# Patient Record
Sex: Female | Born: 1945
Health system: Southern US, Community
[De-identification: ages and names within clinical notes are randomized; demographics above are authoritative.]

## PROBLEM LIST (undated history)

## (undated) DIAGNOSIS — M1990 Unspecified osteoarthritis, unspecified site: Secondary | ICD-10-CM

## (undated) DIAGNOSIS — M47816 Spondylosis without myelopathy or radiculopathy, lumbar region: Secondary | ICD-10-CM

## (undated) DIAGNOSIS — G5600 Carpal tunnel syndrome, unspecified upper limb: Secondary | ICD-10-CM

## (undated) DIAGNOSIS — T7840XA Allergy, unspecified, initial encounter: Secondary | ICD-10-CM

## (undated) DIAGNOSIS — E559 Vitamin D deficiency, unspecified: Secondary | ICD-10-CM

## (undated) DIAGNOSIS — D649 Anemia, unspecified: Secondary | ICD-10-CM

## (undated) DIAGNOSIS — K219 Gastro-esophageal reflux disease without esophagitis: Secondary | ICD-10-CM

## (undated) DIAGNOSIS — R112 Nausea with vomiting, unspecified: Secondary | ICD-10-CM

## (undated) DIAGNOSIS — I10 Essential (primary) hypertension: Secondary | ICD-10-CM

## (undated) DIAGNOSIS — K5733 Diverticulitis of large intestine without perforation or abscess with bleeding: Secondary | ICD-10-CM

## (undated) DIAGNOSIS — R51 Headache: Secondary | ICD-10-CM

## (undated) DIAGNOSIS — M8580 Other specified disorders of bone density and structure, unspecified site: Secondary | ICD-10-CM

## (undated) DIAGNOSIS — H269 Unspecified cataract: Secondary | ICD-10-CM

## (undated) DIAGNOSIS — R42 Dizziness and giddiness: Secondary | ICD-10-CM

## (undated) DIAGNOSIS — Z9889 Other specified postprocedural states: Secondary | ICD-10-CM

## (undated) HISTORY — PX: COLONOSCOPY: SHX174

## (undated) HISTORY — PX: KNEE ARTHROSCOPY: SUR90

## (undated) HISTORY — DX: Allergy, unspecified, initial encounter: T78.40XA

## (undated) HISTORY — DX: Carpal tunnel syndrome, unspecified upper limb: G56.00

## (undated) HISTORY — DX: Unspecified cataract: H26.9

## (undated) HISTORY — PX: OTHER SURGICAL HISTORY: SHX169

## (undated) HISTORY — DX: Vitamin D deficiency, unspecified: E55.9

## (undated) HISTORY — DX: Other specified disorders of bone density and structure, unspecified site: M85.80

## (undated) HISTORY — DX: Dizziness and giddiness: R42

## (undated) HISTORY — PX: CARPAL TUNNEL RELEASE: SHX101

## (undated) HISTORY — DX: Diverticulitis of large intestine without perforation or abscess with bleeding: K57.33

## (undated) HISTORY — DX: Spondylosis without myelopathy or radiculopathy, lumbar region: M47.816

## (undated) HISTORY — PX: FOOT SURGERY: SHX648

---

## 1975-12-25 HISTORY — PX: VAGINAL HYSTERECTOMY: SUR661

## 1980-12-24 HISTORY — PX: BREAST REDUCTION SURGERY: SHX8

## 1980-12-24 HISTORY — PX: REDUCTION MAMMAPLASTY: SUR839

## 1998-09-22 ENCOUNTER — Encounter: Admission: RE | Admit: 1998-09-22 | Discharge: 1998-12-14 | Payer: Self-pay | Admitting: Anesthesiology

## 1999-11-01 ENCOUNTER — Ambulatory Visit (HOSPITAL_COMMUNITY): Admission: RE | Admit: 1999-11-01 | Discharge: 1999-11-01 | Payer: Self-pay | Admitting: *Deleted

## 2000-09-11 ENCOUNTER — Ambulatory Visit (HOSPITAL_COMMUNITY): Admission: RE | Admit: 2000-09-11 | Discharge: 2000-09-11 | Payer: Self-pay | Admitting: *Deleted

## 2000-11-26 ENCOUNTER — Ambulatory Visit (HOSPITAL_COMMUNITY): Admission: RE | Admit: 2000-11-26 | Discharge: 2000-11-26 | Payer: Self-pay | Admitting: *Deleted

## 2002-05-15 ENCOUNTER — Ambulatory Visit (HOSPITAL_COMMUNITY): Admission: RE | Admit: 2002-05-15 | Discharge: 2002-05-15 | Payer: Self-pay | Admitting: Family Medicine

## 2002-05-15 ENCOUNTER — Encounter: Payer: Self-pay | Admitting: Family Medicine

## 2003-04-19 ENCOUNTER — Ambulatory Visit (HOSPITAL_BASED_OUTPATIENT_CLINIC_OR_DEPARTMENT_OTHER): Admission: RE | Admit: 2003-04-19 | Discharge: 2003-04-19 | Payer: Self-pay

## 2003-09-10 ENCOUNTER — Emergency Department (HOSPITAL_COMMUNITY): Admission: EM | Admit: 2003-09-10 | Discharge: 2003-09-10 | Payer: Self-pay | Admitting: Emergency Medicine

## 2003-11-30 ENCOUNTER — Emergency Department (HOSPITAL_COMMUNITY): Admission: AD | Admit: 2003-11-30 | Discharge: 2003-11-30 | Payer: Self-pay | Admitting: Family Medicine

## 2004-09-05 ENCOUNTER — Ambulatory Visit: Payer: Self-pay | Admitting: Family Medicine

## 2004-09-13 ENCOUNTER — Ambulatory Visit: Payer: Self-pay | Admitting: *Deleted

## 2004-09-13 ENCOUNTER — Ambulatory Visit: Payer: Self-pay | Admitting: Family Medicine

## 2004-09-15 ENCOUNTER — Ambulatory Visit (HOSPITAL_COMMUNITY): Admission: RE | Admit: 2004-09-15 | Discharge: 2004-09-15 | Payer: Self-pay | Admitting: Internal Medicine

## 2004-09-18 ENCOUNTER — Emergency Department (HOSPITAL_COMMUNITY): Admission: EM | Admit: 2004-09-18 | Discharge: 2004-09-19 | Payer: Self-pay | Admitting: Emergency Medicine

## 2004-09-20 ENCOUNTER — Ambulatory Visit: Payer: Self-pay | Admitting: *Deleted

## 2005-03-21 ENCOUNTER — Ambulatory Visit: Payer: Self-pay | Admitting: Family Medicine

## 2005-10-17 ENCOUNTER — Ambulatory Visit: Payer: Self-pay | Admitting: Family Medicine

## 2006-04-19 ENCOUNTER — Ambulatory Visit: Payer: Self-pay | Admitting: Family Medicine

## 2006-06-07 ENCOUNTER — Ambulatory Visit: Payer: Self-pay | Admitting: *Deleted

## 2006-06-12 ENCOUNTER — Ambulatory Visit (HOSPITAL_COMMUNITY): Admission: RE | Admit: 2006-06-12 | Discharge: 2006-06-12 | Payer: Self-pay | Admitting: Gastroenterology

## 2006-08-21 ENCOUNTER — Ambulatory Visit: Payer: Self-pay | Admitting: Family Medicine

## 2006-09-18 ENCOUNTER — Ambulatory Visit: Payer: Self-pay | Admitting: Internal Medicine

## 2006-10-10 ENCOUNTER — Ambulatory Visit (HOSPITAL_COMMUNITY): Admission: RE | Admit: 2006-10-10 | Discharge: 2006-10-10 | Payer: Self-pay | Admitting: Internal Medicine

## 2006-11-20 ENCOUNTER — Ambulatory Visit: Payer: Self-pay | Admitting: Family Medicine

## 2007-02-14 ENCOUNTER — Ambulatory Visit: Payer: Self-pay | Admitting: Internal Medicine

## 2007-02-26 ENCOUNTER — Ambulatory Visit: Payer: Self-pay | Admitting: Family Medicine

## 2007-03-05 ENCOUNTER — Ambulatory Visit: Payer: Self-pay | Admitting: Family Medicine

## 2007-03-06 ENCOUNTER — Ambulatory Visit (HOSPITAL_COMMUNITY): Admission: RE | Admit: 2007-03-06 | Discharge: 2007-03-06 | Payer: Self-pay | Admitting: Internal Medicine

## 2007-04-02 ENCOUNTER — Ambulatory Visit (HOSPITAL_COMMUNITY): Admission: RE | Admit: 2007-04-02 | Discharge: 2007-04-02 | Payer: Self-pay | Admitting: Internal Medicine

## 2007-06-25 ENCOUNTER — Ambulatory Visit: Payer: Self-pay | Admitting: Internal Medicine

## 2007-07-17 ENCOUNTER — Encounter (INDEPENDENT_AMBULATORY_CARE_PROVIDER_SITE_OTHER): Payer: Self-pay | Admitting: Family Medicine

## 2007-07-17 ENCOUNTER — Ambulatory Visit: Payer: Self-pay | Admitting: Internal Medicine

## 2007-07-17 LAB — CONVERTED CEMR LAB
ALT: 15 units/L (ref 0–35)
Albumin: 3.9 g/dL (ref 3.5–5.2)
Alkaline Phosphatase: 41 units/L (ref 39–117)
CO2: 25 meq/L (ref 19–32)
Calcium: 10 mg/dL (ref 8.4–10.5)
Chloride: 106 meq/L (ref 96–112)
Cholesterol: 171 mg/dL (ref 0–200)
Creatinine, Ser: 0.71 mg/dL (ref 0.40–1.20)
Eosinophils Relative: 6 % — ABNORMAL HIGH (ref 0–5)
HCT: 29.5 % — ABNORMAL LOW (ref 36.0–46.0)
HDL: 57 mg/dL (ref >39)
Neutro Abs: 2.2 10*3/uL (ref 1.7–7.7)
Neutrophils Relative %: 45 % (ref 43–77)
RBC: 3.95 M/uL (ref 3.87–5.11)
Sodium: 138 meq/L (ref 135–145)
Total Bilirubin: 0.4 mg/dL (ref 0.3–1.2)
Total CHOL/HDL Ratio: 3
Total Protein: 7.4 g/dL (ref 6.0–8.3)

## 2007-09-10 ENCOUNTER — Encounter (INDEPENDENT_AMBULATORY_CARE_PROVIDER_SITE_OTHER): Payer: Self-pay | Admitting: *Deleted

## 2007-11-28 ENCOUNTER — Encounter (INDEPENDENT_AMBULATORY_CARE_PROVIDER_SITE_OTHER): Payer: Self-pay | Admitting: Internal Medicine

## 2007-11-28 ENCOUNTER — Ambulatory Visit: Payer: Self-pay | Admitting: Family Medicine

## 2007-11-28 LAB — CONVERTED CEMR LAB
Basophils Absolute: 0 10*3/uL
Basophils Relative: 1 %
Eosinophils Absolute: 0.3 10*3/uL
Eosinophils Relative: 5 %
Ferritin: 5 ng/mL — ABNORMAL LOW
HCT: 32 % — ABNORMAL LOW
Hemoglobin: 10 g/dL — ABNORMAL LOW
Iron: 28 ug/dL — ABNORMAL LOW
Lymphocytes Relative: 41 %
Lymphs Abs: 2.6 10*3/uL
MCHC: 31.3 g/dL
MCV: 77.5 fL — ABNORMAL LOW
Monocytes Absolute: 0.5 10*3/uL
Monocytes Relative: 8 %
Neutro Abs: 2.9 10*3/uL
Neutrophils Relative %: 46 %
Platelets: 346 10*3/uL
RBC: 4.13 M/uL
RDW: 17.5 % — ABNORMAL HIGH
Saturation Ratios: 7 % — ABNORMAL LOW
TIBC: 392 ug/dL
UIBC: 364 ug/dL
WBC: 6.3 10*3/uL

## 2007-12-10 ENCOUNTER — Ambulatory Visit (HOSPITAL_COMMUNITY): Admission: RE | Admit: 2007-12-10 | Discharge: 2007-12-10 | Payer: Self-pay | Admitting: Family Medicine

## 2008-05-18 ENCOUNTER — Ambulatory Visit: Payer: Self-pay | Admitting: Internal Medicine

## 2008-05-18 ENCOUNTER — Encounter (INDEPENDENT_AMBULATORY_CARE_PROVIDER_SITE_OTHER): Payer: Self-pay | Admitting: Family Medicine

## 2008-05-18 LAB — CONVERTED CEMR LAB
Basophils Relative: 0 % (ref 0–1)
Eosinophils Absolute: 0.4 10*3/uL (ref 0.0–0.7)
HCT: 34.8 % — ABNORMAL LOW (ref 36.0–46.0)
Hemoglobin: 11.3 g/dL — ABNORMAL LOW (ref 12.0–15.0)
Iron: 17 ug/dL — ABNORMAL LOW (ref 42–145)
Lymphocytes Relative: 39 % (ref 12–46)
Lymphs Abs: 3 10*3/uL (ref 0.7–4.0)
Neutro Abs: 3.7 10*3/uL (ref 1.7–7.7)
RDW: 17.8 % — ABNORMAL HIGH (ref 11.5–15.5)

## 2008-10-14 ENCOUNTER — Ambulatory Visit: Payer: Self-pay | Admitting: Internal Medicine

## 2008-11-09 ENCOUNTER — Ambulatory Visit: Payer: Self-pay | Admitting: Internal Medicine

## 2008-11-10 ENCOUNTER — Encounter (INDEPENDENT_AMBULATORY_CARE_PROVIDER_SITE_OTHER): Payer: Self-pay | Admitting: Adult Health

## 2008-11-10 LAB — CONVERTED CEMR LAB
ALT: 21 units/L (ref 0–35)
AST: 22 units/L (ref 0–37)
Albumin: 3.9 g/dL (ref 3.5–5.2)
Alkaline Phosphatase: 47 units/L (ref 39–117)
BUN: 17 mg/dL (ref 6–23)
Basophils Absolute: 0 10*3/uL (ref 0.0–0.1)
Basophils Relative: 0 % (ref 0–1)
CO2: 25 meq/L (ref 19–32)
Calcium: 10.4 mg/dL (ref 8.4–10.5)
Chloride: 103 meq/L (ref 96–112)
Creatinine, Ser: 0.62 mg/dL (ref 0.40–1.20)
Eosinophils Absolute: 0.3 10*3/uL (ref 0.0–0.7)
Eosinophils Relative: 5 % (ref 0–5)
Glucose, Bld: 94 mg/dL (ref 70–99)
HCT: 36.2 % (ref 36.0–46.0)
Hemoglobin: 11.1 g/dL — ABNORMAL LOW (ref 12.0–15.0)
Iron: 21 ug/dL — ABNORMAL LOW (ref 42–145)
Lymphocytes Relative: 46 % (ref 12–46)
Lymphs Abs: 2.8 10*3/uL (ref 0.7–4.0)
MCHC: 30.7 g/dL (ref 30.0–36.0)
MCV: 84 fL (ref 78.0–100.0)
Monocytes Absolute: 0.4 10*3/uL (ref 0.1–1.0)
Monocytes Relative: 7 % (ref 3–12)
Neutro Abs: 2.6 10*3/uL (ref 1.7–7.7)
Neutrophils Relative %: 43 % (ref 43–77)
Platelets: 326 10*3/uL (ref 150–400)
Potassium: 4.4 meq/L (ref 3.5–5.3)
RBC: 4.31 M/uL (ref 3.87–5.11)
RDW: 15.8 % — ABNORMAL HIGH (ref 11.5–15.5)
Saturation Ratios: 6 % — ABNORMAL LOW (ref 20–55)
Sed Rate: 27 mm/hr — ABNORMAL HIGH (ref 0–22)
Sodium: 137 meq/L (ref 135–145)
TIBC: 379 ug/dL (ref 250–470)
TSH: 0.631 microintl units/mL (ref 0.350–4.50)
Total Bilirubin: 0.3 mg/dL (ref 0.3–1.2)
Total Protein: 7.6 g/dL (ref 6.0–8.3)
UIBC: 358 ug/dL
WBC: 6.2 10*3/uL (ref 4.0–10.5)

## 2009-01-25 ENCOUNTER — Ambulatory Visit: Payer: Self-pay | Admitting: Internal Medicine

## 2009-01-25 ENCOUNTER — Encounter (INDEPENDENT_AMBULATORY_CARE_PROVIDER_SITE_OTHER): Payer: Self-pay | Admitting: Adult Health

## 2009-01-25 LAB — CONVERTED CEMR LAB
AST: 18 units/L (ref 0–37)
Albumin: 4 g/dL (ref 3.5–5.2)
Alkaline Phosphatase: 45 units/L (ref 39–117)
Basophils Absolute: 0 10*3/uL (ref 0.0–0.1)
CO2: 23 meq/L (ref 19–32)
Creatinine, Ser: 0.79 mg/dL (ref 0.40–1.20)
MCHC: 33 g/dL (ref 30.0–36.0)
MCV: 82.2 fL (ref 78.0–100.0)
Monocytes Absolute: 0.6 10*3/uL (ref 0.1–1.0)
Monocytes Relative: 8 % (ref 3–12)
Neutro Abs: 3.8 10*3/uL (ref 1.7–7.7)
Neutrophils Relative %: 52 % (ref 43–77)
RDW: 16.3 % — ABNORMAL HIGH (ref 11.5–15.5)
Sodium: 139 meq/L (ref 135–145)
Total Bilirubin: 0.3 mg/dL (ref 0.3–1.2)
Total Protein: 7.9 g/dL (ref 6.0–8.3)

## 2009-04-29 ENCOUNTER — Ambulatory Visit: Payer: Self-pay | Admitting: Internal Medicine

## 2009-05-03 ENCOUNTER — Ambulatory Visit (HOSPITAL_COMMUNITY): Admission: RE | Admit: 2009-05-03 | Discharge: 2009-05-03 | Payer: Self-pay | Admitting: Internal Medicine

## 2009-05-11 ENCOUNTER — Ambulatory Visit: Payer: Self-pay | Admitting: Family Medicine

## 2009-07-06 ENCOUNTER — Ambulatory Visit: Payer: Self-pay | Admitting: Family Medicine

## 2009-09-01 ENCOUNTER — Ambulatory Visit: Payer: Self-pay | Admitting: Family Medicine

## 2009-09-01 ENCOUNTER — Telehealth (INDEPENDENT_AMBULATORY_CARE_PROVIDER_SITE_OTHER): Payer: Self-pay | Admitting: *Deleted

## 2009-09-23 ENCOUNTER — Encounter (INDEPENDENT_AMBULATORY_CARE_PROVIDER_SITE_OTHER): Payer: Self-pay | Admitting: Adult Health

## 2009-09-23 ENCOUNTER — Ambulatory Visit: Payer: Self-pay | Admitting: Internal Medicine

## 2009-09-23 LAB — CONVERTED CEMR LAB
ALT: 19 units/L (ref 0–35)
Albumin: 3.9 g/dL (ref 3.5–5.2)
Chloride: 104 meq/L (ref 96–112)
Creatinine, Ser: 0.73 mg/dL (ref 0.40–1.20)
Eosinophils Relative: 7 % — ABNORMAL HIGH (ref 0–5)
Hemoglobin: 11.4 g/dL — ABNORMAL LOW (ref 12.0–15.0)
Lymphs Abs: 2.5 10*3/uL (ref 0.7–4.0)
MCHC: 32.9 g/dL (ref 30.0–36.0)
Monocytes Relative: 8 % (ref 3–12)
Neutrophils Relative %: 47 % (ref 43–77)
Platelets: 306 10*3/uL (ref 150–400)
RDW: 15.8 % — ABNORMAL HIGH (ref 11.5–15.5)
Sodium: 140 meq/L (ref 135–145)
Vitamin B-12: 900 pg/mL (ref 211–911)

## 2009-10-26 ENCOUNTER — Encounter: Admission: RE | Admit: 2009-10-26 | Discharge: 2009-12-21 | Payer: Self-pay | Admitting: Occupational Medicine

## 2009-12-27 ENCOUNTER — Encounter: Admission: RE | Admit: 2009-12-27 | Discharge: 2010-02-01 | Payer: Self-pay | Admitting: Occupational Medicine

## 2010-05-04 ENCOUNTER — Encounter (INDEPENDENT_AMBULATORY_CARE_PROVIDER_SITE_OTHER): Payer: Self-pay | Admitting: Adult Health

## 2010-05-04 ENCOUNTER — Ambulatory Visit: Payer: Self-pay | Admitting: Family Medicine

## 2010-05-18 ENCOUNTER — Ambulatory Visit (HOSPITAL_COMMUNITY): Admission: RE | Admit: 2010-05-18 | Discharge: 2010-05-18 | Payer: Self-pay | Admitting: Internal Medicine

## 2010-06-08 ENCOUNTER — Ambulatory Visit: Payer: Self-pay | Admitting: Internal Medicine

## 2010-06-08 ENCOUNTER — Encounter (INDEPENDENT_AMBULATORY_CARE_PROVIDER_SITE_OTHER): Payer: Self-pay | Admitting: Adult Health

## 2010-06-08 LAB — CONVERTED CEMR LAB
Albumin: 3.8 g/dL (ref 3.5–5.2)
BUN: 13 mg/dL (ref 6–23)
CO2: 24 meq/L (ref 19–32)
Calcium: 9.5 mg/dL (ref 8.4–10.5)
Eosinophils Absolute: 0.3 10*3/uL (ref 0.0–0.7)
Glucose, Bld: 80 mg/dL (ref 70–99)
LDL Cholesterol: 86 mg/dL (ref 0–99)
Lymphocytes Relative: 40 % (ref 12–46)
MCV: 83.8 fL (ref 78.0–100.0)
Monocytes Absolute: 0.5 10*3/uL (ref 0.1–1.0)
Neutrophils Relative %: 44 % (ref 43–77)
Platelets: 260 10*3/uL (ref 150–400)
Potassium: 3.8 meq/L (ref 3.5–5.3)
RDW: 16.4 % — ABNORMAL HIGH (ref 11.5–15.5)
Sodium: 138 meq/L (ref 135–145)
Total Bilirubin: 0.5 mg/dL (ref 0.3–1.2)
Total CHOL/HDL Ratio: 3
Total Protein: 7.4 g/dL (ref 6.0–8.3)

## 2010-11-14 ENCOUNTER — Encounter (INDEPENDENT_AMBULATORY_CARE_PROVIDER_SITE_OTHER): Payer: Self-pay | Admitting: *Deleted

## 2010-11-14 LAB — CONVERTED CEMR LAB
Ferritin: 12 ng/mL (ref 10–291)
Iron: 39 ug/dL — ABNORMAL LOW (ref 42–145)
MCV: 84.2 fL (ref 78.0–100.0)
Platelets: 302 10*3/uL (ref 150–400)
RBC: 4.42 M/uL (ref 3.87–5.11)
RDW: 15 % (ref 11.5–15.5)
UIBC: 331 ug/dL

## 2011-01-13 ENCOUNTER — Encounter: Payer: Self-pay | Admitting: Internal Medicine

## 2011-01-14 ENCOUNTER — Encounter: Payer: Self-pay | Admitting: Internal Medicine

## 2011-02-15 ENCOUNTER — Ambulatory Visit (HOSPITAL_BASED_OUTPATIENT_CLINIC_OR_DEPARTMENT_OTHER)
Admission: RE | Admit: 2011-02-15 | Discharge: 2011-02-16 | Disposition: A | Discharge status: Home / Self Care | Payer: Worker's Compensation | Source: Ambulatory Visit | Attending: Orthopedic Surgery | Admitting: Orthopedic Surgery

## 2011-02-15 DIAGNOSIS — IMO0002 Reserved for concepts with insufficient information to code with codable children: Secondary | ICD-10-CM | POA: Insufficient documentation

## 2011-02-15 DIAGNOSIS — D649 Anemia, unspecified: Secondary | ICD-10-CM | POA: Insufficient documentation

## 2011-02-15 DIAGNOSIS — X58XXXA Exposure to other specified factors, initial encounter: Secondary | ICD-10-CM | POA: Insufficient documentation

## 2011-02-15 DIAGNOSIS — K219 Gastro-esophageal reflux disease without esophagitis: Secondary | ICD-10-CM | POA: Insufficient documentation

## 2011-02-15 DIAGNOSIS — M1710 Unilateral primary osteoarthritis, unspecified knee: Secondary | ICD-10-CM | POA: Insufficient documentation

## 2011-02-15 DIAGNOSIS — I10 Essential (primary) hypertension: Secondary | ICD-10-CM | POA: Insufficient documentation

## 2011-02-15 LAB — POCT I-STAT 4, (NA,K, GLUC, HGB,HCT)
HCT: 39 % (ref 36.0–46.0)
Sodium: 140 mEq/L (ref 135–145)

## 2011-02-23 NOTE — Op Note (Signed)
  NAME:  Shelly Leonard, Shelly Leonard              ACCOUNT NO.:  [id number]  MEDICAL RECORD NO.:  [id number]          PATIENT TYPE:  LOCATION:                                 FACILITY:  PHYSICIAN:  Shelly Leonard, M.D.DATE OF BIRTH:  09/09/1953  DATE OF PROCEDURE:  02/15/2011 DATE OF DISCHARGE:                              OPERATIVE REPORT   PREOPERATIVE DIAGNOSES: 1. Torn medial meniscus, right knee. 2. Degenerative arthritis, right knee.  POSTOPERATIVE DIAGNOSES: 1. Torn medial meniscus, right knee. 2. Degenerative arthritis, right knee.  OPERATIONS: 1. Diagnostic arthroscopy, right knee. 2. Medial meniscectomy, right knee. 3. Synovectomy suprapatellar pouch, right knee. 4. Abrasion chondroplasty of the medial femoral condyle. 5. Abrasion chondroplasty of the lateral femoral condyle. 6. Microfracture technique involving the medial femoral condyle.  DESCRIPTION OF PROCEDURE:  Under general anesthesia, routine orthopedic prep and draping of the right lower extremity was carried out.  She first had 1 g of IV Ancef.  Note, the appropriate time-out was carried out prior to surgery.  Also, I marked the appropriate leg in the holding area.  A small incision was made in the suprapatellar region of the right knee.  I entered the inflow cannula and then inserted the inflow cannula and distended the knee with saline.  Following that, a small punctate incision made in the anterolateral joint.  The arthroscope was entered from lateral approach, and a complete diagnostic arthroscopy was carried out.  At that time, I made a small incision medially and inserted my shaver suction device and did abrasion chondroplasty of the medial femoral condyle first.  Also at that time, it was noted this was such a severe arthritic condition in a lady of her age, and I went ahead and carried out the microfracture technique at the medial femoral condyle.  She also had a tear of the posterior horn of the  medial meniscus.  I did a partial medial meniscectomy.  I then went up into the suprapatellar pouch and noted a severe chronic synovitis.  I inserted the ArthroCare and did a synovectomy.  Following that, I went down and inspected the lateral joint and did an abrasion chondroplasty to lateral femoral condyle as well.  The lateral meniscus was intact.  I thoroughly irrigated out the knee, removed the fluid, closed all 3 punctate incisions with 3-0 nylon suture.  I injected 20 mL of 0.25% Marcaine with epinephrine in the knee joint.  Sterile Neosporin dressing was applied.  POSTOPERATIVE INSTRUCTIONS: 1. She will be on aspirin 325 mg b.i.d. for 2 weeks. 2. The patient will be on Percocet 10/650 one every 4 hours p.r.n. for     pain. 3. She will see me in the office in 12 to 14 days, prior to that if     she has problem. 4. She will be on crutches partial to full weightbearing as tolerated.          ______________________________ Shelly Lynch. Shelly Leonard, M.D.     RAG/MEDQ  D:  02/20/2011  T:  02/20/2011  Job:  [id number]  Electronically Signed by Ranee Gosselin M.D. on 02/23/2011 12:12:32 PM

## 2011-02-23 NOTE — Op Note (Signed)
NAME:  Shelly Leonard, Shelly Leonard               ACCOUNT NO.:  [id number]  MEDICAL RECORD NO.:  [id number]            PATIENT TYPE:  LOCATION:                                 FACILITY:  PHYSICIAN:  Georges Lynch. Ojas Coone, M.D.DATE OF BIRTH:  1946/01/18  DATE OF PROCEDURE:  02/15/2011 DATE OF DISCHARGE:                              OPERATIVE REPORT   SURGEON:  Georges Lynch. Darrelyn Hillock, M.D.  Threasa HeadsTyler Deis.  PREOPERATIVE DIAGNOSES: 1. Traumatic tear of the medial meniscus. 2. Degenerative arthritis of the right knee joint.  POSTOPERATIVE DIAGNOSES: 1. Traumatic tear of the medial meniscus. 2. Degenerative arthritis of the right knee joint.  OPERATION: 1. Diagnostic arthroscopy right knee. 2. Medial meniscectomy right knee. 3. Abrasion chondroplasty medial femoral condyle right knee. 4. Abrasion chondroplasty lateral femoral condyle right knee. 5. Microfracture technique medial femoral condyle right knee. 6. Synovectomy suprapatellar pouch right knee.  PROCEDURE:  Under general anesthesia, a routine orthopedic prep and draping of the right lower extremity was carried out.  At this time, appropriate time-out was carried out.  I did mark the appropriate right leg in the holding area.  She had 1 g of IV Ancef.  After the prep and draping and after the time-out was carried out, a small punctate incision was made in the suprapatellar pouch, inflow cannula was inserted and the knee was distended with saline.  Another small punctate incision was made in the anterolateral joint.  The arthroscope was entered from the lateral approach after we inserted the inflow cannula. We then having the knee distended did a complete diagnostic arthroscopy, she had rather significant changes in the patellofemoral region.  I introduced a shaver suction device, did a synovectomy.  At this time, I went down the lateral joint doing abrasion chondroplasty lateral femoral condyle because she had obvious arthritic changes  there.  Following that the lateral meniscus was intact except for small peripheral tears.  The cruciates were intact.  I went over the medial joint, she had a severe tear of the medial meniscus.  I introduced a shaver suction device and did a medial meniscectomy.  Also, she had obvious degenerative changes of the femoral and tibial sides of the medial joint.  I introduced the shaver suction device and first did abrasion chondroplasty of distal femur.  Following that I did a microfracture technique on the distal femur in the usual fashion.  I thoroughly irrigated out the area, closed all 3 punctate incisions with 3-0 nylon suture.  I injected 30 cc of 0.25% Marcaine with epinephrine in the joint.  A sterile Neosporin dressing was applied.  Postop, she cannot take hydrocodone but she can take Percocet.  According to the patient, I put her on Percocet 10/650 mg one every 4 hours p.r.n. for pain, I gave her aspirin 325 mg b.i.d. starting today and for 2 weeks as an anticoagulant, she will be on a walker or crutches partial weightbearing as tolerated.  She will see me in the office in 12 to 14 days or prior to that was a problem.          ______________________________ Georges Lynch  Darrelyn Hillock, M.D.     RAG/MEDQ  D:  02/15/2011  T:  02/16/2011  Job:  [id number]  Electronically Signed by Ranee Gosselin M.D. on 02/23/2011 12:12:31 PM

## 2011-04-10 ENCOUNTER — Other Ambulatory Visit (HOSPITAL_COMMUNITY): Payer: Self-pay | Admitting: Family Medicine

## 2011-04-10 DIAGNOSIS — Z1231 Encounter for screening mammogram for malignant neoplasm of breast: Secondary | ICD-10-CM

## 2011-05-11 NOTE — Op Note (Signed)
NAME:  Shelly Leonard, Shelly Leonard              ACCOUNT NO.:  [id number]   MEDICAL RECORD NO.:  [id number]          PATIENT TYPE:  AMB   LOCATION:  ENDO                         FACILITY:  [facility]   PHYSICIAN:  James L. Malon Kindle., M.D.DATE OF BIRTH:  [birth year]   DATE OF PROCEDURE:  06/12/2006  DATE OF DISCHARGE:                                 OPERATIVE REPORT   PROCEDURE:  Colonoscopy.   MEDICATIONS:  Fentanyl 75 mcg, Versed 6 mg IV.   SCOPE:  Pediatric adjustable scope.   INDICATIONS:  Colon cancer screening and iron deficiency anemia.   DESCRIPTION OF PROCEDURE:  The procedure explained to the patient and  consent obtained.  In the left lateral decubitus position, the Olympus scope  was inserted and advanced.  Prep excellent.  We were able to advance to the  cecum without difficulty, the appendiceal orifice and ileocecal valve seen.  Scope withdrawn.  Colonic mucosa carefully examined.  Cecum, transverse,  descending and sigmoid colon seen well.  Entire colon free of polyps,  masses, tumors, diverticula.  The rectum seen on forward and retroflexed  view had small hemorrhoids, otherwise normal.  Scope withdrawn.  The patient  tolerated the procedure well.   ASSESSMENT:  1.  Normal screening colonoscopy, V76.51.  2.  Iron-deficiency anemia, 280.0.   PLAN:  Recommend fecal occult blood test in 5 years.  Repeat colonoscopy in  10 years.  Follow up at [facility].           ______________________________  Llana Aliment. Malon Kindle., M.D.     Waldron Session  D:  06/12/2006  T:  06/12/2006  Job:  [id number]   cc:   Fannie Knee Drinkard, F.N.P.  [facility]

## 2011-05-22 ENCOUNTER — Ambulatory Visit (HOSPITAL_COMMUNITY)
Admission: RE | Admit: 2011-05-22 | Discharge: 2011-05-22 | Disposition: A | Discharge status: Home / Self Care | Payer: Self-pay | Source: Ambulatory Visit | Attending: Family Medicine | Admitting: Family Medicine

## 2011-05-22 DIAGNOSIS — Z1231 Encounter for screening mammogram for malignant neoplasm of breast: Secondary | ICD-10-CM | POA: Insufficient documentation

## 2011-08-10 ENCOUNTER — Emergency Department (HOSPITAL_COMMUNITY)
Admission: EM | Admit: 2011-08-10 | Discharge: 2011-08-10 | Disposition: A | Discharge status: Home / Self Care | Payer: Self-pay | Attending: Emergency Medicine | Admitting: Emergency Medicine

## 2011-08-10 ENCOUNTER — Emergency Department (HOSPITAL_COMMUNITY): Payer: Self-pay

## 2011-08-10 DIAGNOSIS — S92309A Fracture of unspecified metatarsal bone(s), unspecified foot, initial encounter for closed fracture: Secondary | ICD-10-CM | POA: Insufficient documentation

## 2011-08-10 DIAGNOSIS — IMO0002 Reserved for concepts with insufficient information to code with codable children: Secondary | ICD-10-CM | POA: Insufficient documentation

## 2011-08-10 DIAGNOSIS — M79609 Pain in unspecified limb: Secondary | ICD-10-CM | POA: Insufficient documentation

## 2011-08-10 DIAGNOSIS — I10 Essential (primary) hypertension: Secondary | ICD-10-CM | POA: Insufficient documentation

## 2012-03-18 ENCOUNTER — Other Ambulatory Visit (HOSPITAL_COMMUNITY): Payer: Self-pay | Admitting: Family Medicine

## 2012-03-18 DIAGNOSIS — Z780 Asymptomatic menopausal state: Secondary | ICD-10-CM

## 2012-03-18 DIAGNOSIS — Z1231 Encounter for screening mammogram for malignant neoplasm of breast: Secondary | ICD-10-CM

## 2012-04-14 ENCOUNTER — Encounter (HOSPITAL_COMMUNITY): Payer: Self-pay | Admitting: Pharmacy Technician

## 2012-04-15 ENCOUNTER — Ambulatory Visit (HOSPITAL_COMMUNITY)
Admission: RE | Admit: 2012-04-15 | Discharge: 2012-04-15 | Disposition: A | Discharge status: Home / Self Care | Payer: PRIVATE HEALTH INSURANCE | Source: Ambulatory Visit | Attending: Orthopedic Surgery | Admitting: Orthopedic Surgery

## 2012-04-15 ENCOUNTER — Encounter (HOSPITAL_COMMUNITY)
Admission: RE | Admit: 2012-04-15 | Discharge: 2012-04-15 | Disposition: A | Discharge status: Home / Self Care | Payer: PRIVATE HEALTH INSURANCE | Source: Ambulatory Visit | Attending: Orthopedic Surgery | Admitting: Orthopedic Surgery

## 2012-04-15 ENCOUNTER — Encounter (HOSPITAL_COMMUNITY): Payer: Self-pay

## 2012-04-15 DIAGNOSIS — Z01810 Encounter for preprocedural cardiovascular examination: Secondary | ICD-10-CM | POA: Insufficient documentation

## 2012-04-15 DIAGNOSIS — M1710 Unilateral primary osteoarthritis, unspecified knee: Secondary | ICD-10-CM | POA: Insufficient documentation

## 2012-04-15 DIAGNOSIS — Z01812 Encounter for preprocedural laboratory examination: Secondary | ICD-10-CM | POA: Insufficient documentation

## 2012-04-15 DIAGNOSIS — Z01818 Encounter for other preprocedural examination: Secondary | ICD-10-CM | POA: Insufficient documentation

## 2012-04-15 HISTORY — DX: Other specified postprocedural states: Z98.890

## 2012-04-15 HISTORY — DX: Headache: R51

## 2012-04-15 HISTORY — DX: Unspecified osteoarthritis, unspecified site: M19.90

## 2012-04-15 HISTORY — DX: Essential (primary) hypertension: I10

## 2012-04-15 HISTORY — DX: Anemia, unspecified: D64.9

## 2012-04-15 HISTORY — DX: Gastro-esophageal reflux disease without esophagitis: K21.9

## 2012-04-15 HISTORY — DX: Nausea with vomiting, unspecified: R11.2

## 2012-04-15 LAB — BASIC METABOLIC PANEL
CO2: 27 mEq/L (ref 19–32)
Chloride: 101 mEq/L (ref 96–112)
GFR calc Af Amer: >90 mL/min (ref >90)
Potassium: 3.7 mEq/L (ref 3.5–5.1)

## 2012-04-15 LAB — URINALYSIS, ROUTINE W REFLEX MICROSCOPIC
Glucose, UA: NEGATIVE mg/dL
Leukocytes, UA: NEGATIVE
Protein, ur: NEGATIVE mg/dL
Specific Gravity, Urine: 1.009 (ref 1.005–1.030)

## 2012-04-15 LAB — DIFFERENTIAL
Basophils Absolute: 0 10*3/uL (ref 0.0–0.1)
Lymphocytes Relative: 38 % (ref 12–46)
Lymphs Abs: 2.8 10*3/uL (ref 0.7–4.0)
Monocytes Absolute: 0.6 10*3/uL (ref 0.1–1.0)
Monocytes Relative: 9 % (ref 3–12)
Neutro Abs: 3.5 10*3/uL (ref 1.7–7.7)

## 2012-04-15 LAB — APTT: aPTT: 29 seconds (ref 24–37)

## 2012-04-15 LAB — SURGICAL PCR SCREEN
MRSA, PCR: NEGATIVE
Staphylococcus aureus: NEGATIVE

## 2012-04-15 LAB — CBC
HCT: 37.5 % (ref 36.0–46.0)
Hemoglobin: 12.2 g/dL (ref 12.0–15.0)
MCV: 82.4 fL (ref 78.0–100.0)
RBC: 4.55 MIL/uL (ref 3.87–5.11)
WBC: 7.4 10*3/uL (ref 4.0–10.5)

## 2012-04-15 MED ORDER — CEFAZOLIN SODIUM 1-5 GM-% IV SOLN: 1.0000 g | INTRAVENOUS | Status: DC

## 2012-04-15 NOTE — Progress Notes (Signed)
04/15/12 1522  OBSTRUCTIVE SLEEP APNEA  Have you ever been diagnosed with sleep apnea through a sleep study? No  Do you snore loudly (loud enough to be heard through closed doors)?  0  Do you often feel tired, fatigued, or sleepy during the daytime? 1  Has anyone observed you stop breathing during your sleep? 0  Do you have, or are you being treated for high blood pressure? 1  BMI more than 35 kg/m2? 1  Age over 65 years old? 1  Neck circumference greater than 40 cm/18 inches? 0  Gender: 0  Obstructive Sleep Apnea Score 4   Score 4 or greater  Updated health history

## 2012-04-15 NOTE — Patient Instructions (Signed)
20 CINDI GHAZARIAN  04/15/2012   Your procedure is scheduled on: 04/21/12   0955am-1150am  Report to [facility] at 0730 AM.  Call this number if you have problems the morning of surgery: [phone]   Remember:   Do not eat food:After Midnight.  May have clear liquids:until Midnight .  [name] Kitchen  Take these medicines the morning of surgery with A SIP OF WATER:   Do not wear jewelry, make-up or nail polish.  Do not wear lotions, powders, or perfumes.   Do not shave 48 hours prior to surgery.  Do not bring valuables to the hospital.  Contacts, dentures or bridgework may not be worn into surgery.  Leave suitcase in the car. After surgery it may be brought to your room.  For patients admitted to the hospital, checkout time is 11:00 AM the day of discharge.    Special Instructions: CHG Shower Use Special Wash: 1/2 bottle night before surgery and 1/2 bottle morning of surgery. shower chin to toes with CHG.  Wash face and private parts with regular soap.    Please read over the following fact sheets that you were given: MRSA Information, coughing and deep breathing exercises, leg exercises, Blood Transfusion Fact Sheet, Incentive Spirometry Fact Sheet

## 2012-04-16 NOTE — Pre-Procedure Instructions (Signed)
04/16/12 Called [phone] and left message for pt to call me to verify PCP .   04/16/12 Called [phone] and [phone] in pm and no answer at either number .   Calling to verify PCP so that nurse could send Obstructive Sleep Apnea elevated risk evaluation done in preop.

## 2012-04-17 NOTE — H&P (Signed)
Shelly Leonard is an 66 y.o. female.    Chief Complaint: right knee OA and pain   HPI: Pt is a 66 y.o. female complaining of right knee pain since a fall at work.  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 At first she was trying conservative treatments and and eventually had a knee arthroscopy in February of 2012.  Since that time she pain has continually increased.  X-rays in the clinic show end-stage arthritic changes of the right knee. Pt has tried various conservative treatments which have failed to alleviate their symptoms including a steroid injection which helped for a short time, but pain quickly returned. Various options are discussed with the patient. Risks, benefits and expectations were discussed with the patient. Patient understand the risks, benefits and expectations and wishes to proceed with surgery.   PCP:  Dr. Norberto Sorenson @ [facility]  D/C Plans:  Home with HHPT  Post-op Meds:  Rx given for ASA, Robaxin, Celebrex, Iron, Colace and MiraLax  Tranexamic Acid:   To be given  Decadron:   To be given  PMH: Past Medical History  Diagnosis Date  . PONV (postoperative nausea and vomiting)   . Hypertension   . Anemia     low iron per pt   . GERD (gastroesophageal reflux disease)   . Headache     occasional   . Arthritis     knees and hands     PSH: Past Surgical History  Procedure Date  . Knee arthroscopy     right knee 2012   . Carpal tunnel release     left   . Other surgical history     spur removed top of left foot   . Breast reduction surgery     Social History:  reports that she has never smoked. She has never used smokeless tobacco. She reports that she does not drink alcohol or use illicit drugs.  Allergies:  No Known Allergies  Medications: No current facility-administered medications for this encounter.   Current Outpatient Prescriptions    Medication Sig Dispense Refill  . aspirin 81 MG chewable tablet Chew 81 mg by mouth daily with breakfast.      . Garlic 100 MG TABS Take 100 mg by mouth daily with breakfast.       . Multiple Vitamin (MULITIVITAMIN WITH MINERALS) TABS Take 1 tablet by mouth daily with breakfast.      . omega-3 acid ethyl esters (LOVAZA) 1 G capsule Take 1 g by mouth daily with breakfast.      . pantoprazole (PROTONIX) 40 MG tablet Take 40 mg by mouth daily with breakfast.      . triamterene-hydrochlorothiazide (MAXZIDE) 75-50 MG per tablet Take 1 tablet by mouth daily with breakfast.      . verapamil (CALAN-SR) 240 MG CR tablet Take 240 mg by mouth daily with breakfast.      . vitamin B-12 (CYANOCOBALAMIN) 1000 MCG tablet Take 1,000 mcg by mouth daily with breakfast.        ROS: Review of Systems  Constitutional: Negative.   HENT: Negative.   Eyes: Negative.   Respiratory: Negative.   Cardiovascular: Negative.   Gastrointestinal: Negative.   Genitourinary: Negative.   Musculoskeletal: Positive for back pain and joint pain.  Skin: Negative.   Neurological: Negative.   Endo/Heme/Allergies: Negative.   Psychiatric/Behavioral: Negative.  Physical Exam: BP: 144/88 ; HR: 75 ; Resp: 16 ; Physical Exam  Constitutional: She is oriented to person, place, and time and well-developed, well-nourished, and in no distress.  HENT:  Head: Normocephalic and atraumatic.  Nose: Nose normal.  Mouth/Throat: Oropharynx is clear and moist. She has dentures (partials on the lower).  Eyes: Pupils are equal, round, and reactive to light.  Neck: Neck supple. No JVD present. No tracheal deviation present. No thyromegaly present.  Cardiovascular: Normal rate, regular rhythm, normal heart sounds and intact distal pulses.   Pulmonary/Chest: Effort normal and breath sounds normal. No stridor. No respiratory distress. She has no wheezes. She has no rales. She exhibits no tenderness.  Abdominal: Soft. There is no  tenderness. There is no guarding.  Musculoskeletal:       Right knee: She exhibits decreased range of motion (0-110, with pain), swelling and bony tenderness. She exhibits no effusion, no ecchymosis, no deformity, no laceration and no erythema. tenderness found.  Lymphadenopathy:    She has no cervical adenopathy.  Neurological: She is alert and oriented to person, place, and time.  Skin: Skin is warm and dry.  Psychiatric: Affect normal.     Assessment/Plan Assessment: right knee OA and pain   Plan: Patient will undergo a right total knee arthroplasty on 04/21/2012 per Dr. Charlann Boxer at [facility]. Risks benefits and expectation were discussed with the patient. Patient understand risks, benefits and expectation and wishes to proceed.   Anastasio Auerbach Jedd Schulenburg   PAC  04/17/2012, 5:47 PM

## 2012-04-21 ENCOUNTER — Encounter (HOSPITAL_COMMUNITY): Payer: Self-pay | Admitting: *Deleted

## 2012-04-21 ENCOUNTER — Inpatient Hospital Stay (HOSPITAL_COMMUNITY)
Admission: RE | Admit: 2012-04-21 | Discharge: 2012-04-24 | DRG: 470 | Disposition: A | Discharge status: Home Health | Payer: Worker's Compensation | Source: Ambulatory Visit | Attending: Orthopedic Surgery | Admitting: Orthopedic Surgery

## 2012-04-21 ENCOUNTER — Ambulatory Visit (HOSPITAL_COMMUNITY): Payer: Worker's Compensation | Admitting: *Deleted

## 2012-04-21 ENCOUNTER — Encounter (HOSPITAL_COMMUNITY)
Admission: RE | Disposition: A | Discharge status: Home Health | Payer: Self-pay | Source: Ambulatory Visit | Attending: Orthopedic Surgery

## 2012-04-21 ENCOUNTER — Encounter (HOSPITAL_COMMUNITY): Payer: Self-pay

## 2012-04-21 DIAGNOSIS — D649 Anemia, unspecified: Secondary | ICD-10-CM | POA: Diagnosis not present

## 2012-04-21 DIAGNOSIS — E669 Obesity, unspecified: Secondary | ICD-10-CM | POA: Diagnosis present

## 2012-04-21 DIAGNOSIS — M1710 Unilateral primary osteoarthritis, unspecified knee: Principal | ICD-10-CM | POA: Diagnosis present

## 2012-04-21 DIAGNOSIS — I10 Essential (primary) hypertension: Secondary | ICD-10-CM | POA: Diagnosis present

## 2012-04-21 DIAGNOSIS — Z96659 Presence of unspecified artificial knee joint: Secondary | ICD-10-CM

## 2012-04-21 DIAGNOSIS — K219 Gastro-esophageal reflux disease without esophagitis: Secondary | ICD-10-CM | POA: Diagnosis present

## 2012-04-21 HISTORY — PX: TOTAL KNEE ARTHROPLASTY: SHX125

## 2012-04-21 LAB — TYPE AND SCREEN: Antibody Screen: NEGATIVE

## 2012-04-21 SURGERY — ARTHROPLASTY, KNEE, TOTAL
Anesthesia: General | Site: Knee | Laterality: Right | Wound class: Clean

## 2012-04-21 MED ORDER — CEFAZOLIN SODIUM-DEXTROSE 2-3 GM-% IV SOLR
2.0000 g | Freq: Four times a day (QID) | INTRAVENOUS | Status: AC
  Administered 2012-04-21 – 2012-04-22 (×3): 2 g via INTRAVENOUS
  Filled 2012-04-21 (×3): qty 50

## 2012-04-21 MED ORDER — POLYETHYLENE GLYCOL 3350 17 G PO PACK
17.0000 g | PACK | Freq: Two times a day (BID) | ORAL | Status: DC
Start: 2012-04-21 — End: 2012-04-24
  Administered 2012-04-21 – 2012-04-23 (×5): 17 g via ORAL
  Filled 2012-04-21 (×8): qty 1

## 2012-04-21 MED ORDER — LACTATED RINGERS IV SOLN
INTRAVENOUS | Status: DC | PRN
  Administered 2012-04-21 (×2): via INTRAVENOUS

## 2012-04-21 MED ORDER — PHENOL 1.4 % MT LIQD: 1.0000 | OROMUCOSAL | Status: DC | PRN

## 2012-04-21 MED ORDER — FERROUS SULFATE 325 (65 FE) MG PO TABS
325.0000 mg | ORAL_TABLET | Freq: Three times a day (TID) | ORAL | Status: DC
  Administered 2012-04-22 – 2012-04-24 (×6): 325 mg via ORAL
  Filled 2012-04-21 (×12): qty 1

## 2012-04-21 MED ORDER — METHOCARBAMOL 500 MG PO TABS
500.0000 mg | ORAL_TABLET | Freq: Four times a day (QID) | ORAL | Status: DC | PRN
  Administered 2012-04-23 – 2012-04-24 (×2): 500 mg via ORAL
  Filled 2012-04-21 (×3): qty 1

## 2012-04-21 MED ORDER — CEFAZOLIN SODIUM-DEXTROSE 2-3 GM-% IV SOLR
INTRAVENOUS | Status: AC
  Filled 2012-04-21: qty 50

## 2012-04-21 MED ORDER — ACETAMINOPHEN 10 MG/ML IV SOLN
INTRAVENOUS | Status: AC
  Filled 2012-04-21: qty 100

## 2012-04-21 MED ORDER — CHLORHEXIDINE GLUCONATE 4 % EX LIQD
60.0000 mL | Freq: Once | CUTANEOUS | Status: DC
  Filled 2012-04-21: qty 60

## 2012-04-21 MED ORDER — FLEET ENEMA 7-19 GM/118ML RE ENEM: 1.0000 | ENEMA | Freq: Once | RECTAL | Status: AC | PRN

## 2012-04-21 MED ORDER — VERAPAMIL HCL ER 240 MG PO TBCR
240.0000 mg | EXTENDED_RELEASE_TABLET | Freq: Every day | ORAL | Status: DC
  Administered 2012-04-22 – 2012-04-24 (×3): 240 mg via ORAL
  Filled 2012-04-21 (×4): qty 1

## 2012-04-21 MED ORDER — MENTHOL 3 MG MT LOZG
1.0000 | LOZENGE | OROMUCOSAL | Status: DC | PRN
  Filled 2012-04-21: qty 9

## 2012-04-21 MED ORDER — PHENYLEPHRINE HCL 10 MG/ML IJ SOLN
INTRAMUSCULAR | Status: DC | PRN
  Administered 2012-04-21 (×2): 120 ug via INTRAVENOUS
  Administered 2012-04-21 (×2): 80 ug via INTRAVENOUS
  Administered 2012-04-21: 40 ug via INTRAVENOUS

## 2012-04-21 MED ORDER — DEXAMETHASONE SODIUM PHOSPHATE 10 MG/ML IJ SOLN
10.0000 mg | Freq: Once | INTRAMUSCULAR | Status: AC
  Administered 2012-04-22: 10 mg via INTRAVENOUS
  Filled 2012-04-21: qty 1

## 2012-04-21 MED ORDER — KETAMINE HCL 50 MG/ML IJ SOLN
INTRAMUSCULAR | Status: DC | PRN
  Administered 2012-04-21 (×5): 10 mg via INTRAMUSCULAR

## 2012-04-21 MED ORDER — CEFAZOLIN SODIUM-DEXTROSE 2-3 GM-% IV SOLR
2.0000 g | Freq: Once | INTRAVENOUS | Status: AC
  Administered 2012-04-21: 2 g via INTRAVENOUS

## 2012-04-21 MED ORDER — METHOCARBAMOL 100 MG/ML IJ SOLN
500.0000 mg | Freq: Four times a day (QID) | INTRAVENOUS | Status: DC | PRN
  Administered 2012-04-21: 500 mg via INTRAVENOUS
  Filled 2012-04-21: qty 5

## 2012-04-21 MED ORDER — BUPIVACAINE-EPINEPHRINE PF 0.25-1:200000 % IJ SOLN
INTRAMUSCULAR | Status: AC
  Filled 2012-04-21: qty 60

## 2012-04-21 MED ORDER — ACETAMINOPHEN 10 MG/ML IV SOLN
INTRAVENOUS | Status: DC | PRN
  Administered 2012-04-21: 1000 mg via INTRAVENOUS

## 2012-04-21 MED ORDER — EPHEDRINE SULFATE 50 MG/ML IJ SOLN
INTRAMUSCULAR | Status: DC | PRN
  Administered 2012-04-21: 5 mg via INTRAVENOUS

## 2012-04-21 MED ORDER — ZOLPIDEM TARTRATE 5 MG PO TABS: 5.0000 mg | ORAL_TABLET | Freq: Every evening | ORAL | Status: DC | PRN

## 2012-04-21 MED ORDER — SODIUM CHLORIDE 0.9 % IV SOLN
INTRAVENOUS | Status: DC
  Administered 2012-04-21 – 2012-04-22 (×3): via INTRAVENOUS
  Filled 2012-04-21 (×11): qty 1000

## 2012-04-21 MED ORDER — HYDROMORPHONE HCL PF 1 MG/ML IJ SOLN
0.5000 mg | INTRAMUSCULAR | Status: DC | PRN
  Administered 2012-04-21: 0.5 mg via INTRAVENOUS
  Filled 2012-04-21 (×2): qty 1

## 2012-04-21 MED ORDER — KETOROLAC TROMETHAMINE 30 MG/ML IJ SOLN
INTRAMUSCULAR | Status: AC
  Filled 2012-04-21: qty 1

## 2012-04-21 MED ORDER — ONDANSETRON HCL 4 MG PO TABS: 4.0000 mg | ORAL_TABLET | Freq: Four times a day (QID) | ORAL | Status: DC | PRN

## 2012-04-21 MED ORDER — DOCUSATE SODIUM 100 MG PO CAPS
100.0000 mg | ORAL_CAPSULE | Freq: Two times a day (BID) | ORAL | Status: DC
  Administered 2012-04-21 – 2012-04-24 (×7): 100 mg via ORAL
  Filled 2012-04-21 (×8): qty 1

## 2012-04-21 MED ORDER — HYDROCODONE-ACETAMINOPHEN 7.5-325 MG PO TABS
1.0000 | ORAL_TABLET | ORAL | Status: DC
  Administered 2012-04-21 – 2012-04-22 (×3): 1 via ORAL
  Administered 2012-04-22 (×2): 2 via ORAL
  Administered 2012-04-22 – 2012-04-23 (×6): 1 via ORAL
  Administered 2012-04-24: 2 via ORAL
  Administered 2012-04-24 (×2): 1 via ORAL
  Filled 2012-04-21: qty 2
  Filled 2012-04-21 (×4): qty 1
  Filled 2012-04-21: qty 2
  Filled 2012-04-21 (×6): qty 1
  Filled 2012-04-21: qty 2
  Filled 2012-04-21: qty 1
  Filled 2012-04-21: qty 2

## 2012-04-21 MED ORDER — KETOROLAC TROMETHAMINE 30 MG/ML IJ SOLN
INTRAMUSCULAR | Status: DC | PRN
  Administered 2012-04-21: 30 mg via INTRAVENOUS

## 2012-04-21 MED ORDER — ALUM & MAG HYDROXIDE-SIMETH 200-200-20 MG/5ML PO SUSP: 30.0000 mL | ORAL | Status: DC | PRN

## 2012-04-21 MED ORDER — DIPHENHYDRAMINE HCL 25 MG PO CAPS: 25.0000 mg | ORAL_CAPSULE | Freq: Four times a day (QID) | ORAL | Status: DC | PRN

## 2012-04-21 MED ORDER — LIDOCAINE HCL (CARDIAC) 20 MG/ML IV SOLN
INTRAVENOUS | Status: DC | PRN
  Administered 2012-04-21: 50 mg via INTRAVENOUS

## 2012-04-21 MED ORDER — METOCLOPRAMIDE HCL 5 MG/ML IJ SOLN: 5.0000 mg | Freq: Three times a day (TID) | INTRAMUSCULAR | Status: DC | PRN

## 2012-04-21 MED ORDER — VITAMINS A & D EX OINT
TOPICAL_OINTMENT | CUTANEOUS | Status: AC
  Filled 2012-04-21: qty 5

## 2012-04-21 MED ORDER — BUPIVACAINE IN DEXTROSE 0.75-8.25 % IT SOLN
INTRATHECAL | Status: DC | PRN
  Administered 2012-04-21: 1.7 mL via INTRATHECAL

## 2012-04-21 MED ORDER — TRIAMTERENE-HCTZ 75-50 MG PO TABS
1.0000 | ORAL_TABLET | Freq: Every day | ORAL | Status: DC
  Administered 2012-04-22 – 2012-04-24 (×3): 1 via ORAL
  Filled 2012-04-21 (×4): qty 1

## 2012-04-21 MED ORDER — 0.9 % SODIUM CHLORIDE (POUR BTL) OPTIME
TOPICAL | Status: DC | PRN
  Administered 2012-04-21: 1000 mL

## 2012-04-21 MED ORDER — HYDROMORPHONE HCL PF 1 MG/ML IJ SOLN: 0.2500 mg | INTRAMUSCULAR | Status: DC | PRN

## 2012-04-21 MED ORDER — PANTOPRAZOLE SODIUM 40 MG PO TBEC
40.0000 mg | DELAYED_RELEASE_TABLET | Freq: Every day | ORAL | Status: DC
  Administered 2012-04-22 – 2012-04-24 (×3): 40 mg via ORAL
  Filled 2012-04-21 (×4): qty 1

## 2012-04-21 MED ORDER — BISACODYL 5 MG PO TBEC: 5.0000 mg | DELAYED_RELEASE_TABLET | Freq: Every day | ORAL | Status: DC | PRN

## 2012-04-21 MED ORDER — PROMETHAZINE HCL 25 MG/ML IJ SOLN: 6.2500 mg | INTRAMUSCULAR | Status: DC | PRN

## 2012-04-21 MED ORDER — ONDANSETRON HCL 4 MG/2ML IJ SOLN
INTRAMUSCULAR | Status: DC | PRN
  Administered 2012-04-21: 4 mg via INTRAVENOUS

## 2012-04-21 MED ORDER — PROPOFOL 10 MG/ML IV EMUL
INTRAVENOUS | Status: DC | PRN
  Administered 2012-04-21: 100 ug/kg/min via INTRAVENOUS

## 2012-04-21 MED ORDER — DEXAMETHASONE SODIUM PHOSPHATE 10 MG/ML IJ SOLN
10.0000 mg | Freq: Once | INTRAMUSCULAR | Status: AC
  Administered 2012-04-21: 10 mg via INTRAVENOUS
  Filled 2012-04-21: qty 1

## 2012-04-21 MED ORDER — BUPIVACAINE-EPINEPHRINE PF 0.25-1:200000 % IJ SOLN
INTRAMUSCULAR | Status: DC | PRN
  Administered 2012-04-21: 60 mL

## 2012-04-21 MED ORDER — RIVAROXABAN 10 MG PO TABS
10.0000 mg | ORAL_TABLET | ORAL | Status: DC
  Administered 2012-04-22 – 2012-04-24 (×3): 10 mg via ORAL
  Filled 2012-04-21 (×4): qty 1

## 2012-04-21 MED ORDER — LACTATED RINGERS IV SOLN
INTRAVENOUS | Status: DC
  Administered 2012-04-21: 1000 mL via INTRAVENOUS

## 2012-04-21 MED ORDER — METOCLOPRAMIDE HCL 10 MG PO TABS: 5.0000 mg | ORAL_TABLET | Freq: Three times a day (TID) | ORAL | Status: DC | PRN

## 2012-04-21 MED ORDER — ONDANSETRON HCL 4 MG/2ML IJ SOLN
4.0000 mg | Freq: Four times a day (QID) | INTRAMUSCULAR | Status: DC | PRN
  Administered 2012-04-21 – 2012-04-23 (×3): 4 mg via INTRAVENOUS
  Filled 2012-04-21 (×3): qty 2

## 2012-04-21 MED ORDER — FENTANYL CITRATE 0.05 MG/ML IJ SOLN
INTRAMUSCULAR | Status: DC | PRN
  Administered 2012-04-21: 100 ug via INTRAVENOUS

## 2012-04-21 MED ORDER — TRANEXAMIC ACID 100 MG/ML IV SOLN
1400.0000 mg | Freq: Once | INTRAVENOUS | Status: AC
  Administered 2012-04-21: 1400 mg via INTRAVENOUS
  Filled 2012-04-21: qty 14

## 2012-04-21 MED ORDER — SODIUM CHLORIDE 0.9 % IR SOLN
Status: DC | PRN
  Administered 2012-04-21: 3000 mL

## 2012-04-21 SURGICAL SUPPLY — 56 items
BAG ZIPLOCK 12X15 (MISCELLANEOUS) ×2 IMPLANT
BANDAGE ELASTIC 6 VELCRO ST LF (GAUZE/BANDAGES/DRESSINGS) ×2 IMPLANT
BANDAGE ESMARK 6X9 LF (GAUZE/BANDAGES/DRESSINGS) ×1 IMPLANT
BLADE SAW SGTL 13.0X1.19X90.0M (BLADE) ×2 IMPLANT
BNDG CMPR 9X6 STRL LF SNTH (GAUZE/BANDAGES/DRESSINGS) ×1
BNDG ESMARK 6X9 LF (GAUZE/BANDAGES/DRESSINGS) ×2
BOWL SMART MIX CTS (DISPOSABLE) ×2 IMPLANT
CEMENT HV SMART SET (Cement) ×4 IMPLANT
CLOTH BEACON ORANGE TIMEOUT ST (SAFETY) ×2 IMPLANT
CUFF TOURN SGL QUICK 34 (TOURNIQUET CUFF) ×1
CUFF TRNQT CYL 34X4X40X1 (TOURNIQUET CUFF) ×1 IMPLANT
DECANTER SPIKE VIAL GLASS SM (MISCELLANEOUS) ×2 IMPLANT
DERMABOND ADVANCED (GAUZE/BANDAGES/DRESSINGS) ×1
DERMABOND ADVANCED .7 DNX12 (GAUZE/BANDAGES/DRESSINGS) ×1 IMPLANT
DRAPE EXTREMITY T 121X128X90 (DRAPE) ×2 IMPLANT
DRAPE POUCH INSTRU U-SHP 10X18 (DRAPES) ×2 IMPLANT
DRAPE U-SHAPE 47X51 STRL (DRAPES) ×2 IMPLANT
DRSG AQUACEL AG ADV 3.5X10 (GAUZE/BANDAGES/DRESSINGS) ×2 IMPLANT
DRSG TEGADERM 4X4.75 (GAUZE/BANDAGES/DRESSINGS) ×2 IMPLANT
DURAPREP 26ML APPLICATOR (WOUND CARE) ×2 IMPLANT
ELECT REM PT RETURN 9FT ADLT (ELECTROSURGICAL) ×2
ELECTRODE REM PT RTRN 9FT ADLT (ELECTROSURGICAL) ×1 IMPLANT
EVACUATOR 1/8 PVC DRAIN (DRAIN) ×2 IMPLANT
FACESHIELD LNG OPTICON STERILE (SAFETY) ×10 IMPLANT
GAUZE SPONGE 2X2 8PLY STRL LF (GAUZE/BANDAGES/DRESSINGS) ×1 IMPLANT
GLOVE BIOGEL PI IND STRL 7.5 (GLOVE) ×1 IMPLANT
GLOVE BIOGEL PI IND STRL 8 (GLOVE) ×1 IMPLANT
GLOVE BIOGEL PI INDICATOR 7.5 (GLOVE) ×1
GLOVE BIOGEL PI INDICATOR 8 (GLOVE) ×1
GLOVE ECLIPSE 8.0 STRL XLNG CF (GLOVE) ×2 IMPLANT
GLOVE ORTHO TXT STRL SZ7.5 (GLOVE) ×4 IMPLANT
GOWN BRE IMP PREV XXLGXLNG (GOWN DISPOSABLE) ×4 IMPLANT
GOWN STRL NON-REIN LRG LVL3 (GOWN DISPOSABLE) ×2 IMPLANT
HANDPIECE INTERPULSE COAX TIP (DISPOSABLE) ×2
IMMOBILIZER KNEE 20 (SOFTGOODS)
IMMOBILIZER KNEE 20 THIGH 36 (SOFTGOODS) IMPLANT
KIT BASIN OR (CUSTOM PROCEDURE TRAY) ×2 IMPLANT
MANIFOLD NEPTUNE II (INSTRUMENTS) ×2 IMPLANT
NDL SAFETY ECLIPSE 18X1.5 (NEEDLE) ×1 IMPLANT
NEEDLE HYPO 18GX1.5 SHARP (NEEDLE) ×1
NS IRRIG 1000ML POUR BTL (IV SOLUTION) ×4 IMPLANT
PACK TOTAL JOINT (CUSTOM PROCEDURE TRAY) ×2 IMPLANT
POSITIONER SURGICAL ARM (MISCELLANEOUS) ×2 IMPLANT
SET HNDPC FAN SPRY TIP SCT (DISPOSABLE) ×1 IMPLANT
SET PAD KNEE POSITIONER (MISCELLANEOUS) ×2 IMPLANT
SPONGE GAUZE 2X2 STER 10/PKG (GAUZE/BANDAGES/DRESSINGS) ×1
SUCTION FRAZIER 12FR DISP (SUCTIONS) ×2 IMPLANT
SUT MNCRL AB 4-0 PS2 18 (SUTURE) ×2 IMPLANT
SUT VIC AB 1 CT1 36 (SUTURE) ×6 IMPLANT
SUT VIC AB 2-0 CT1 27 (SUTURE) ×3
SUT VIC AB 2-0 CT1 TAPERPNT 27 (SUTURE) ×3 IMPLANT
SYR 50ML LL SCALE MARK (SYRINGE) ×2 IMPLANT
TOWEL OR 17X26 10 PK STRL BLUE (TOWEL DISPOSABLE) ×4 IMPLANT
TRAY FOLEY CATH 14FRSI W/METER (CATHETERS) ×2 IMPLANT
WATER STERILE IRR 1500ML POUR (IV SOLUTION) ×2 IMPLANT
WRAP KNEE MAXI GEL POST OP (GAUZE/BANDAGES/DRESSINGS) ×2 IMPLANT

## 2012-04-21 NOTE — Plan of Care (Signed)
Problem: Consults Goal: Diagnosis- Total Joint Replacement Right total knee     

## 2012-04-21 NOTE — Transfer of Care (Signed)
Immediate Anesthesia Transfer of Care Note  Patient: Shelly Leonard  Procedure(s) Performed: Procedure(s) (LRB): TOTAL KNEE ARTHROPLASTY (Right)  Patient Location: PACU  Anesthesia Type: MAC and Spinal  Level of Consciousness: awake, alert , oriented and patient cooperative  Airway & Oxygen Therapy: Patient Spontanous Breathing and Patient connected to face mask oxygen  Post-op Assessment: Report given to PACU RN, Post -op Vital signs reviewed and stable and Patient moving all extremities  Post vital signs: Reviewed and stable  Complications: No apparent anesthesia complications

## 2012-04-21 NOTE — Anesthesia Procedure Notes (Signed)
Spinal  Patient location during procedure: OR Reason for block: at surgeon's request Staffing Anesthesiologist: Azell Der Performed by: anesthesiologist  Preanesthetic Checklist Completed: patient identified, site marked, surgical consent, pre-op evaluation, timeout performed, IV checked, risks and benefits discussed and monitors and equipment checked Spinal Block Patient position: sitting Prep: Betadine Patient monitoring: heart rate, cardiac monitor, continuous pulse ox and blood pressure Approach: midline Location: L3-4 Injection technique: single-shot Needle Needle type: Quincke  Needle gauge: 22 G Additional Notes No paresthesia. Tolerated well. No heme.

## 2012-04-21 NOTE — Op Note (Signed)
NAME:  Shelly Leonard                      MEDICAL RECORD NO.:  [id number]                             FACILITY:  [facility]      PHYSICIAN:  Madlyn Frankel. Charlann Boxer, M.D.  DATE OF BIRTH:  [birth year]      DATE OF PROCEDURE:  04/21/2012                                     OPERATIVE REPORT         PREOPERATIVE DIAGNOSIS:  Right knee osteoarthritis.      POSTOPERATIVE DIAGNOSIS:  Right knee osteoarthritis.      FINDINGS:  The patient was noted to have complete loss of cartilage and   bone-on-bone arthritis with associated osteophytes in the medial, lateral and patellofemoral compartments of   the knee with a significant synovitis and associated effusion.      PROCEDURE:  Right total knee replacement.      COMPONENTS USED:  DePuy rotating platform posterior stabilized knee   system, a size 3 femur, 2.5 tibia, 12.5 mm insert, and 35 patellar   button.      SURGEON:  Madlyn Frankel. Charlann Boxer, M.D.      ASSISTANT:  Lanney Gins, PA-C.      ANESTHESIA:  Spinal.      SPECIMENS:  None.      COMPLICATION:  None.      DRAINS:  One Hemovac.  EBL: <100cc      TOURNIQUET TIME:   Total Tourniquet Time Documented: Thigh (Right) - 30 minutes .      The patient was stable to the recovery room.      INDICATION FOR PROCEDURE:  EIMI VINEY is a 66 y.o. female patient of   mine.  The patient had been seen, evaluated, and treated conservatively in the   office with medication, activity modification, and injections.  The patient had   radiographic changes of bone-on-bone arthritis with endplate sclerosis and osteophytes noted.      The patient failed conservative measures including medication, injections, and activity modification, and at this point was ready for more definitive measures.   Based on the radiographic changes and failed conservative measures, the patient   decided to proceed with total knee replacement.  Risks of infection,   DVT, component failure, need for revision surgery, postop  course, and   expectations were all   discussed and reviewed.  Consent was obtained for benefit of pain   relief.      PROCEDURE IN DETAIL:  The patient was brought to the operative theater.   Once adequate anesthesia, preoperative antibiotics, 2 gm of Ancef administered, the patient was positioned supine with the right thigh tourniquet placed.  The  right lower extremity was prepped and draped in sterile fashion.  A time-   out was performed identifying the patient, planned procedure, and   extremity.      The right lower extremity was placed in the [facility] leg holder.  The leg was   exsanguinated, tourniquet elevated to 250 mmHg.  A midline incision was   made followed by median parapatellar arthrotomy.  Following initial   exposure, attention was first directed to the patella.  Precut  measurement was noted to be 21 mm.  I resected down to 14 mm and used a   35 patellar button to restore patellar height as well as cover the cut   surface.      The lug holes were drilled and a metal shim was placed to protect the   patella from retractors and saw blades.      At this point, attention was now directed to the femur.  The femoral   canal was opened with a drill, irrigated to try to prevent fat emboli.  An   intramedullary rod was passed at 3 degrees valgus, 10 mm of bone was   resected off the distal femur.  Following this resection, the tibia was   subluxated anteriorly.  Using the extramedullary guide, 10 mm of bone was resected off   the proximal lateral tibia.  We confirmed the gap would be   stable medially and laterally with a 10 mm insert as well as confirmed   the cut was perpendicular in the coronal plane, checking with an alignment rod.      Once this was done, I sized the femur to be a size 3 in the anterior-   posterior dimension, chose a standard component based on medial and   lateral dimension.  The size 3 rotation block was then pinned in   position anterior  referenced using the C-clamp to set rotation.  The   anterior, posterior, and  chamfer cuts were made without difficulty nor   notching making certain that I was along the anterior cortex to help   with flexion gap stability.      The final box cut was made off the lateral aspect of distal femur.      At this point, the tibia was sized to be a size 2.5, the size 2.5 tray was   then pinned in position through the medial third of the tubercle,   drilled, and keel punched.  Trial reduction was now carried with a 3 femur,  2.5 tibia, a 12.5 mm insert, and the 35 patella botton.  The knee was brought to   extension, full extension with good flexion stability with the patella   tracking through the trochlea without application of pressure.  Given   all these findings, the trial components removed.  Final components were   opened and cement was mixed.  The knee was irrigated with normal saline   solution and pulse lavage.  The synovial lining was   then injected with 0.25% Marcaine with epinephrine and 1 cc of Toradol,   total of 61 cc.      The knee was irrigated.  Final implants were then cemented onto clean and   dried cut surfaces of bone with the knee brought to extension with a 12.5   mm trial insert.      Once the cement had fully cured, the excess cement was removed   throughout the knee.  I confirmed I was satisfied with the range of   motion and stability, and the final 12.5 mm insert was chosen.  It was   placed into the knee.      The tourniquet had been let down at 30 minutes.  No significant   hemostasis required.  The medium Hemovac drain was placed deep.  The   extensor mechanism was then reapproximated using #1 Vicryl with the knee   in flexion.  The   remaining wound was closed with 2-0 Vicryl  and running 4-0 Monocryl.   The knee was cleaned, dried, dressed sterilely using Dermabond and   Aquacel dressing.  Drain site dressed separately.  The patient was then   brought  to recovery room in stable condition, tolerating the procedure   well.   Please note that Physician Assistant, Lanney Gins, was present for the entirety of the case, and was utilized for pre-operative positioning, peri-operative retractor management, general facilitation of the procedure.  He was also utilized for primary wound closure at the end of the case.              Madlyn Frankel Charlann Boxer, M.D.

## 2012-04-21 NOTE — Anesthesia Postprocedure Evaluation (Signed)
  Anesthesia Post-op Note  Patient: Shelly Leonard  Procedure(s) Performed: Procedure(s) (LRB): TOTAL KNEE ARTHROPLASTY (Right)  Patient Location: PACU  Anesthesia Type: Spinal  Level of Consciousness: awake and alert   Airway and Oxygen Therapy: Patient Spontanous Breathing  Post-op Pain: mild  Post-op Assessment: Post-op Vital signs reviewed, Patient's Cardiovascular Status Stable, Respiratory Function Stable, Patent Airway and No signs of Nausea or vomiting  Post-op Vital Signs: stable  Complications: No apparent anesthesia complications. Spinal is resolving. Moving both feet.

## 2012-04-21 NOTE — Preoperative (Signed)
Beta Blockers   Reason not to administer Beta Blockers:Not Applicable 

## 2012-04-21 NOTE — Interval H&P Note (Signed)
History and Physical Interval Note:  04/21/2012 9:00 AM  Shelly Leonard  has presented today for surgery, with the diagnosis of Osteoarthritis of the Right Knee  The various methods of treatment have been discussed with the patient and family. After consideration of risks, benefits and other options for treatment, the patient has consented to  Procedure(s) (LRB): RIGHT TOTAL KNEE ARTHROPLASTY (Right) as a surgical intervention .  The patients' history has been reviewed, patient examined, no change in status, stable for surgery.  I have reviewed the patients' chart and labs.  Questions were answered to the patient's satisfaction.     [name]

## 2012-04-21 NOTE — Anesthesia Preprocedure Evaluation (Signed)
Anesthesia Evaluation  Patient identified by MRN, date of birth, ID band Patient awake    Reviewed: Allergy & Precautions, H&P , NPO status , Patient's Chart, lab work & pertinent test results  History of Anesthesia Complications (+) PONV  Airway Mallampati: II TM Distance: >3 FB Neck ROM: Full    Dental No notable dental hx.    Pulmonary neg pulmonary ROS,  breath sounds clear to auscultation  Pulmonary exam normal       Cardiovascular Exercise Tolerance: Good hypertension, Pt. on medications Rhythm:Regular Rate:Normal  CXR and ECG: reviewed.   Neuro/Psych  Headaches, negative psych ROS   GI/Hepatic Neg liver ROS, GERD-  Medicated,  Endo/Other  negative endocrine ROS  Renal/GU negative Renal ROS  negative genitourinary   Musculoskeletal negative musculoskeletal ROS (+)   Abdominal (+) + obese,   Peds negative pediatric ROS (+)  Hematology negative hematology ROS (+)   Anesthesia Other Findings   Reproductive/Obstetrics negative OB ROS                           Anesthesia Physical Anesthesia Plan  ASA: II  Anesthesia Plan: General   Post-op Pain Management:    Induction: Intravenous  Airway Management Planned:   Additional Equipment:   Intra-op Plan:   Post-operative Plan: Extubation in OR  Informed Consent: I have reviewed the patients History and Physical, chart, labs and discussed the procedure including the risks, benefits and alternatives for the proposed anesthesia with the patient or authorized representative who has indicated his/her understanding and acceptance.   Dental advisory given  Plan Discussed with: CRNA  Anesthesia Plan Comments:         Anesthesia Quick Evaluation

## 2012-04-22 ENCOUNTER — Encounter (HOSPITAL_COMMUNITY): Payer: Self-pay | Admitting: Orthopedic Surgery

## 2012-04-22 LAB — BASIC METABOLIC PANEL
BUN: 11 mg/dL (ref 6–23)
CO2: 25 mEq/L (ref 19–32)
GFR calc non Af Amer: >90 mL/min (ref >90)
Glucose, Bld: 115 mg/dL — ABNORMAL HIGH (ref 70–99)
Potassium: 4.1 mEq/L (ref 3.5–5.1)
Sodium: 134 mEq/L — ABNORMAL LOW (ref 135–145)

## 2012-04-22 LAB — CBC
HCT: 31.7 % — ABNORMAL LOW (ref 36.0–46.0)
Hemoglobin: 10.4 g/dL — ABNORMAL LOW (ref 12.0–15.0)
MCH: 27.2 pg (ref 26.0–34.0)
MCHC: 32.8 g/dL (ref 30.0–36.0)
RBC: 3.83 MIL/uL — ABNORMAL LOW (ref 3.87–5.11)

## 2012-04-22 NOTE — Progress Notes (Signed)
Physical Therapy Treatment Patient Details Name: Shelly Leonard MRN: [id number] DOB: Mar 16, 1946 Today's Date: 04/22/2012 Time: 4098-1191 PT Time Calculation (min): 16 min  PT Assessment / Plan / Recommendation Comments on Treatment Session       Follow Up Recommendations  Home health PT    Equipment Recommendations  Tub/shower seat    Frequency 7X/week   Plan Discharge plan remains appropriate    Precautions / Restrictions Precautions Precautions: Knee;Fall Required Braces or Orthoses: Knee Immobilizer - Right Knee Immobilizer - Right: Discontinue once straight leg raise with < 10 degree lag Restrictions Weight Bearing Restrictions: No Other Position/Activity Restrictions: WBAT   Pertinent Vitals/Pain     Mobility  Bed Mobility Bed Mobility: Supine to Sit;Sit to Supine Supine to Sit: 4: Min guard Sit to Supine: 4: Min guard Details for Bed Mobility Assistance: Min assist for RLE Transfers Transfers: Sit to Stand;Stand to Sit Sit to Stand: With upper extremity assist;From bed;From toilet;4: Min assist Stand to Sit: 4: Min guard;With upper extremity assist;To bed;To toilet Details for Transfer Assistance: cues for use of UEs and for LE management Ambulation/Gait Ambulation/Gait Assistance: 4: Min guard;4: Min assist Ambulation Distance (Feet): 140 Feet Assistive device: Rolling walker Ambulation/Gait Assistance Details: min cues for stride length, posture and position from RW Gait Pattern: Step-to pattern    Exercises     PT Goals Acute Rehab PT Goals PT Goal Formulation: With patient Time For Goal Achievement: 04/29/12 Potential to Achieve Goals: Good Pt will go Supine/Side to Sit: with supervision PT Goal: Supine/Side to Sit - Progress: Progressing toward goal Pt will go Sit to Supine/Side: with supervision PT Goal: Sit to Supine/Side - Progress: Progressing toward goal Pt will go Sit to Stand: with supervision PT Goal: Sit to Stand - Progress:  Progressing toward goal Pt will go Stand to Sit: with supervision PT Goal: Stand to Sit - Progress: Progressing toward goal Pt will Ambulate: 51 - 150 feet;with supervision;with rolling walker PT Goal: Ambulate - Progress: Progressing toward goal Pt will Go Up / Down Stairs: 3-5 stairs;with min assist;with least restrictive assistive device PT Goal: Up/Down Stairs - Progress: Goal set today  Visit Information  Last PT Received On: 04/22/12 Assistance Needed: +1    Subjective Data      Cognition  Overall Cognitive Status: Appears within functional limits for tasks assessed/performed Arousal/Alertness: Awake/alert Orientation Level: Appears intact for tasks assessed Behavior During Session: [facility] for tasks performed    Balance     End of Session PT - End of Session Activity Tolerance: Patient tolerated treatment well Patient left: in bed;with call bell/phone within reach;with family/visitor present Nurse Communication: Mobility status    [name] 04/22/2012, 2:50 PM

## 2012-04-22 NOTE — Evaluation (Signed)
Physical Therapy Evaluation Patient Details Name: Shelly Leonard MRN: [id number] DOB: 1946/02/25 Today's Date: 04/22/2012 Time: 1030-1056 PT Time Calculation (min): 26 min  PT Assessment / Plan / Recommendation Clinical Impression  Pt with R TKR presents with decreased R LE strength/ROM and limitations in functional mobility    PT Assessment  Patient needs continued PT services    Follow Up Recommendations  Home health PT    Equipment Recommendations  None recommended by PT    Frequency 7X/week    Precautions / Restrictions Precautions Precautions: Knee Restrictions Weight Bearing Restrictions: No   Pertinent Vitals/Pain   4/10 pain      Mobility  Bed Mobility Bed Mobility: Supine to Sit Supine to Sit: 4: Min assist Details for Bed Mobility Assistance: cues for sequence with min assist for R LE Transfers Transfers: Sit to Stand;Stand to Sit Sit to Stand: 4: Min assist Stand to Sit: 4: Min assist Details for Transfer Assistance: cues for use of UEs and for LE management Ambulation/Gait Ambulation/Gait Assistance: 4: Min assist Ambulation Distance (Feet): 60 Feet Assistive device: Rolling walker Ambulation/Gait Assistance Details: cues for sequence, posture and position from RW Gait Pattern: Step-to pattern    Exercises Total Joint Exercises Ankle Circles/Pumps: AROM;Both;10 reps;Supine Quad Sets: AROM;Both;10 reps;Supine Heel Slides: Right;AAROM;10 reps;Supine Straight Leg Raises: 10 reps;AROM;Right;Supine   PT Goals Acute Rehab PT Goals PT Goal Formulation: With patient Time For Goal Achievement: 04/29/12 Potential to Achieve Goals: Good Pt will go Supine/Side to Sit: with supervision PT Goal: Supine/Side to Sit - Progress: Goal set today Pt will go Sit to Supine/Side: with supervision PT Goal: Sit to Supine/Side - Progress: Goal set today Pt will go Sit to Stand: with supervision PT Goal: Sit to Stand - Progress: Goal set today Pt will go Stand to  Sit: with supervision PT Goal: Stand to Sit - Progress: Goal set today Pt will Ambulate: 51 - 150 feet;with supervision;with rolling walker PT Goal: Ambulate - Progress: Goal set today Pt will Go Up / Down Stairs: 3-5 stairs;with min assist;with least restrictive assistive device PT Goal: Up/Down Stairs - Progress: Goal set today  Visit Information  Last PT Received On: 04/22/12 Assistance Needed: +1    Subjective Data  Subjective: I've been waiting a couple years to get to this point Patient Stated Goal: Resume previous lifestyle with decreased pain   Prior Functioning  Home Living Lives With: Spouse Available Help at Discharge: Family Type of Home: House Home Layout: Able to live on main level with bedroom/bathroom Home Adaptive Equipment: Walker - rolling Prior Function Level of Independence: Independent Able to Take Stairs?: Yes Driving: Yes Communication Communication: No difficulties    Cognition  Overall Cognitive Status: Appears within functional limits for tasks assessed/performed Arousal/Alertness: Awake/alert Orientation Level: Appears intact for tasks assessed Behavior During Session: [facility] for tasks performed    Extremity/Trunk Assessment Right Upper Extremity Assessment RUE ROM/Strength/Tone: Within functional levels Left Upper Extremity Assessment LUE ROM/Strength/Tone: Within functional levels Right Lower Extremity Assessment RLE ROM/Strength/Tone: Deficits RLE ROM/Strength/Tone Deficits: AAROM -10 - 60 with 3/5 quad strength Left Lower Extremity Assessment LLE ROM/Strength/Tone: Within functional levels   Balance    End of Session PT - End of Session Activity Tolerance: Patient tolerated treatment well Patient left: in chair;with call bell/phone within reach Nurse Communication: Mobility status   Shelly Leonard 04/22/2012, 12:34 PM

## 2012-04-22 NOTE — Evaluation (Signed)
Occupational Therapy Evaluation Patient Details Name: Shelly Leonard MRN: [id number] DOB: 11-25-1946 Today's Date: 04/22/2012 Time: 4098-1191 OT Time Calculation (min): 22 min  OT Assessment / Plan / Recommendation Clinical Impression  Pt admitted for R TKR impeding mobility and ability to access R foot for ADL.  Recommend shower seat with back, reacher, sock aide, long shoe horn, long bath sponge for home.  Recommend pt practice tub transfer with HHPT prior to attempting on her own.  No further OT needs.    OT Assessment  Patient does not need any further OT services    Follow Up Recommendations  Supervision/Assistance - 24 hour    Equipment Recommendations  Tub/shower seat          Precautions / Restrictions Precautions Precautions: Knee;Fall Required Braces or Orthoses: Knee Immobilizer - Right Knee Immobilizer - Right: Discontinue once straight leg raise with < 10 degree lag Restrictions Weight Bearing Restrictions: No        ADL  Eating/Feeding: Simulated;Independent Where Assessed - Eating/Feeding: Edge of bed Grooming: Performed;Supervision/safety;Wash/dry hands Where Assessed - Grooming: Standing at sink Upper Body Bathing: Simulated;Set up Where Assessed - Upper Body Bathing: Sitting, bed Lower Body Bathing: Simulated;Moderate assistance Where Assessed - Lower Body Bathing: Sitting, bed;Sit to stand from bed Upper Body Dressing: Simulated;Set up Where Assessed - Upper Body Dressing: Sitting, bed Lower Body Dressing: Simulated;Moderate assistance Where Assessed - Lower Body Dressing: Sitting, bed;Sit to stand from bed Toilet Transfer: Performed;Minimal assistance;Other (comment) (min guard) Toilet Transfer Method: Proofreader: Regular height toilet;Grab bars (has sink next to her toilet at home, no space for 3 in1) Toileting - Clothing Manipulation: Performed;Supervision/safety Where Assessed - Toileting Clothing Manipulation:  Standing Toileting - Hygiene: Performed;Independent Where Assessed - Toileting Hygiene: Sit on 3-in-1 or toilet Equipment Used: Gait belt;Reacher;Sock aid;Long-handled shoe horn;Long-handled sponge;Other (comment);Rolling walker (pt wants AE for LB ADL.) Ambulation Related to ADLs: min guard assist for ambulation with RW ADL Comments: Pt not able to access R knee for LB bathing and dressing.        Visit Information  Last OT Received On: 04/22/12 Assistance Needed: +1    Subjective Data  Subjective: "I won't be alone." Patient Stated Goal: Pain free knee.   Prior Functioning  Home Living Lives With: Spouse Available Help at Discharge: Family Type of Home: House Home Layout: Able to live on main level with bedroom/bathroom Bathroom Shower/Tub: Engineer, manufacturing systems: Standard Home Adaptive Equipment: Environmental consultant - rolling;Hand-held shower hose Prior Function Level of Independence: Independent Able to Take Stairs?: Yes Driving: Yes Communication Communication: No difficulties Dominant Hand: Right    Cognition  Overall Cognitive Status: Appears within functional limits for tasks assessed/performed Arousal/Alertness: Awake/alert Orientation Level: Appears intact for tasks assessed Behavior During Session: [facility] for tasks performed    Extremity/Trunk Assessment Right Upper Extremity Assessment RUE ROM/Strength/Tone: Within functional levels Left Upper Extremity Assessment LUE ROM/Strength/Tone: Within functional levels Right Lower Extremity Assessment RLE ROM/Strength/Tone: Deficits RLE ROM/Strength/Tone Deficits: AAROM -10 - 60 with 3/5 quad strength Left Lower Extremity Assessment LLE ROM/Strength/Tone: Within functional levels   Mobility Bed Mobility Bed Mobility: Supine to Sit;Sit to Supine Supine to Sit: 4: Min assist;HOB elevated (20) Sit to Supine: 4: Min assist;HOB elevated (20) Details for Bed Mobility Assistance: Min assist for RLE Transfers Sit to  Stand: With upper extremity assist;From bed;From toilet;4: Min assist Stand to Sit: 4: Min guard;With upper extremity assist;To bed;To toilet Details for Transfer Assistance: cues for use of UEs and for LE  management          End of Session OT - End of Session Activity Tolerance: Patient tolerated treatment well Patient left: in bed;with nursing in room;with call bell/phone within reach   Evern Bio 04/22/2012, 1:58 PM [phone]

## 2012-04-22 NOTE — Progress Notes (Signed)
CARE MANAGEMENT NOTE 04/22/2012  Patient:  Shelly Leonard, Shelly Leonard   Account Number:  [id number]  Date Initiated:  04/22/2012  Documentation initiated by:  Colleen Can  Subjective/Objective Assessment:   dx endstage osteoarthritis right knee; total knee arthroplasty on day of admission  Worker'd comp-date of injury July 2010     Action/Plan:   CM spoke with patient. Plans are that patient will return to her home in [place] where her spouse and daughter will be caregivers. She already has RW but needs shower chair   Anticipated DC Date:  04/23/2012   Anticipated DC Plan:  HOME W HOME HEALTH SERVICES  In-house referral  NA      DC Planning Services  CM consult      [facility] Choice  HOME HEALTH  DURABLE MEDICAL EQUIPMENT   Choice offered to / List presented to:  C-1 Patient   DME arranged  OTHER - SEE COMMENT      DME agency  OTHER - SEE NOTE     HH arranged  HH-2 PT      HH agency  OTHER - SEE NOTE   Status of service:  In process, will continue to follow Medicare Important Message given?  NO (If response is "NO", the following Medicare IM given date fields will be blank)  Per UR Regulation:  Reviewed for med. necessity/level of care/duration of stay  Comments:  04/22/2012 Raynelle Bring BSN CCM [phone] Pt plans to have HHPT when she is discharged to home. She plans to use the Home health agency that worker's comp vendor sets her up with. Worker's comp case [employer] has been faxed Orders for HHpt, shower chair, last progress notes, Physical therapy notes, op report and H&P to [phone] with confirmation received. She will call bck with information regarding vendor that is to be used.

## 2012-04-22 NOTE — Progress Notes (Signed)
Subjective: 1 Day Post-Op Procedure(s) (LRB): TOTAL KNEE ARTHROPLASTY (Right)   Patient reports pain as mild. Pain well controlled. No events throughout the night.  Objective:   VITALS:   Filed Vitals:   04/22/12 0625  BP: 136/81  Pulse: 69  Temp: 97.6 F (36.4 C)  Resp: 14    Neurovascular intact Dorsiflexion/Plantar flexion intact Incision: dressing C/D/I No cellulitis present Compartment soft  LABS  Basename 04/22/12 0410  HGB 10.4*  HCT 31.7*  WBC 13.8*  PLT 248     Basename 04/22/12 0410  NA 134*  K 4.1  BUN 11  CREATININE 0.49*  GLUCOSE 115*     Assessment/Plan: 1 Day Post-Op Procedure(s) (LRB): TOTAL KNEE ARTHROPLASTY (Right)   Foley cath d/c'ed HV drain d/c'ed Advance diet Up with therapy D/C IV fluids Plan for discharge tomorrow to home if she continues to do well   Anastasio Auerbach. Manasseh Pittsley   PAC  04/22/2012, 7:45 AM

## 2012-04-23 LAB — CBC
Hemoglobin: 10.1 g/dL — ABNORMAL LOW (ref 12.0–15.0)
MCH: 27 pg (ref 26.0–34.0)
MCHC: 32.5 g/dL (ref 30.0–36.0)
MCV: 83.2 fL (ref 78.0–100.0)
RBC: 3.74 MIL/uL — ABNORMAL LOW (ref 3.87–5.11)

## 2012-04-23 LAB — BASIC METABOLIC PANEL
BUN: 15 mg/dL (ref 6–23)
CO2: 28 mEq/L (ref 19–32)
Calcium: 10.3 mg/dL (ref 8.4–10.5)
Creatinine, Ser: 0.59 mg/dL (ref 0.50–1.10)
GFR calc non Af Amer: >90 mL/min (ref >90)
Glucose, Bld: 113 mg/dL — ABNORMAL HIGH (ref 70–99)
Sodium: 134 mEq/L — ABNORMAL LOW (ref 135–145)

## 2012-04-23 MED ORDER — DIPHENHYDRAMINE HCL 25 MG PO CAPS: 25.0000 mg | ORAL_CAPSULE | Freq: Four times a day (QID) | ORAL | Status: DC | PRN

## 2012-04-23 MED ORDER — CELECOXIB 200 MG PO CAPS: 200.0000 mg | ORAL_CAPSULE | Freq: Two times a day (BID) | ORAL | Status: AC

## 2012-04-23 MED ORDER — DSS 100 MG PO CAPS: 100.0000 mg | ORAL_CAPSULE | Freq: Two times a day (BID) | ORAL | Status: AC

## 2012-04-23 MED ORDER — METHOCARBAMOL 500 MG PO TABS: 500.0000 mg | ORAL_TABLET | Freq: Four times a day (QID) | ORAL | Status: AC | PRN

## 2012-04-23 MED ORDER — FERROUS SULFATE 325 (65 FE) MG PO TABS: 325.0000 mg | ORAL_TABLET | Freq: Three times a day (TID) | ORAL | Status: DC

## 2012-04-23 MED ORDER — POLYETHYLENE GLYCOL 3350 17 G PO PACK: 17.0000 g | PACK | Freq: Two times a day (BID) | ORAL | Status: AC

## 2012-04-23 MED ORDER — HYDROCODONE-ACETAMINOPHEN 7.5-325 MG PO TABS: 1.0000 | ORAL_TABLET | ORAL | Status: AC | PRN

## 2012-04-23 MED ORDER — ASPIRIN EC 325 MG PO TBEC: 325.0000 mg | DELAYED_RELEASE_TABLET | Freq: Two times a day (BID) | ORAL | Status: AC

## 2012-04-23 NOTE — Progress Notes (Signed)
04/23/12 1500  PT Visit Information  Last PT Received On 04/23/12  Assistance Needed +1  PT Time Calculation  PT Start Time 1519  PT Stop Time 1543  PT Time Calculation (min) 24 min  Subjective Data  Subjective I can't get any sleep  Precautions  Precautions Knee;Fall  Restrictions  Other Position/Activity Restrictions WBAT  Cognition  Overall Cognitive Status Appears within functional limits for tasks assessed/performed  Arousal/Alertness Awake/alert  Orientation Level Appears intact for tasks assessed  Behavior During Session [facility] for tasks performed  Bed Mobility  Bed Mobility Supine to Sit;Sit to Supine  Supine to Sit 5: Supervision  Sit to Supine 4: Min guard  Details for Bed Mobility Assistance min guard R LE  Transfers  Transfers Sit to Stand;Stand to Sit  Sit to Stand With upper extremity assist;From bed;From toilet;4: Min assist  Stand to Sit 4: Min guard;With upper extremity assist;To bed;To toilet  Details for Transfer Assistance cues for use of UEs and for LE management  Ambulation/Gait  Ambulation/Gait Assistance 5: Supervision;4: Min guard  Ambulation Distance (Feet) 120 Feet (10)  Assistive device Rolling walker  Ambulation/Gait Assistance Details initial cues for sequence  Gait Pattern Step-to pattern;Step-through pattern  Stairs Yes  Stairs Assistance 4: Min assist;4: Min guard  Stairs Assistance Details (indicate cue type and reason) cues for safety and sequence  Stair Management Technique Two rails;Step to pattern;Forwards  Number of Stairs 5   PT - End of Session  Activity Tolerance Patient tolerated treatment well  Patient left with call bell/phone within reach;with family/visitor present;in bed  PT - Assessment/Plan  Comments on Treatment Session progressing well  PT Plan Discharge plan remains appropriate  PT Frequency 7X/week  Follow Up Recommendations Home health PT  Equipment Recommended None recommended by PT;Tub/shower seat  Acute Rehab PT  Goals  Time For Goal Achievement 04/29/12  Potential to Achieve Goals Good  Pt will go Supine/Side to Sit with supervision  PT Goal: Supine/Side to Sit - Progress Progressing toward goal  Pt will go Sit to Supine/Side with supervision  PT Goal: Sit to Supine/Side - Progress Progressing toward goal  Pt will go Sit to Stand with supervision  PT Goal: Sit to Stand - Progress Progressing toward goal  Pt will go Stand to Sit with supervision  PT Goal: Stand to Sit - Progress Progressing toward goal  Pt will Ambulate 51 - 150 feet;with supervision;with rolling walker  PT Goal: Ambulate - Progress Progressing toward goal  Pt will Go Up / Down Stairs 3-5 stairs;with min assist;with least restrictive assistive device  PT Goal: Up/Down Stairs - Progress Met

## 2012-04-23 NOTE — Discharge Summary (Signed)
Physician Discharge Summary  Patient ID: LLESENIA FOGAL MRN: [id number] DOB/AGE: Dec 24, 1946 66 y.o.  Admit date: 04/21/2012 Discharge date:  04/24/2012  Procedures:  Procedure(s) (LRB): TOTAL KNEE ARTHROPLASTY (Right)  Attending Physician:  Dr. Durene Romans   Admission Diagnoses: Right knee OA and pain   Discharge Diagnoses:  Principal Problem:  *S/P right TKA Hypertension   Anemia   GERD   Headache - occasional   Arthritis  HPI: Pt is a 66 y.o. female complaining of right knee pain since a fall at work. 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 At first she was trying conservative treatments and and eventually had a knee arthroscopy in February of 2012. Since that time she pain has continually increased. X-rays in the clinic show end-stage arthritic changes of the right knee. Pt has tried various conservative treatments which have failed to alleviate their symptoms including a steroid injection which helped for a short time, but pain quickly returned. Various options are discussed with the patient. Risks, benefits and expectations were discussed with the patient. Patient understand the risks, benefits and expectations and wishes to proceed with surgery.   PCP: No primary provider on file.   Discharged Condition: good  Hospital Course:  Patient underwent the above stated procedure on 04/21/2012. Patient tolerated the procedure well and brought to the recovery room in good condition and subsequently to the floor.  POD #1 BP: 136/81 ; Pulse: 69 ; Temp: 97.6 F (36.4 C) ; Resp: 14  Pt's foley was removed, as well as the hemovac drain removed. IV was changed to a saline lock. Patient reports pain as mild. Pain well controlled. No events throughout the night. Neurovascular intact, dorsiflexion/plantar flexion intact, incision: dressing C/D/I, no cellulitis present and compartment  soft.   LABS  Basename  04/22/12 0410   HGB  10.4  HCT  31.7   POD #2  BP: 129/78 ; Pulse: 60 ; Temp: 97.8 F (36.6 C) ; Resp: 16  Patient reports pain as mild. Has episode of increased pain, but otherwise it is well controlled. No events throughout the night. Neurovascular intact, dorsiflexion/plantar flexion intact, incision: dressing C/D/I, no cellulitis present and compartment soft.   LABS  Basename  04/23/12 0430  HGB  10.1  HCT  31.1   POD #3  BP: 149/86 ; Pulse: 92 ; Temp: 98 F (36.7 C) ; Resp: 16  Patient reports pain as mild. Pain well controlled. No events throughout the night. Ready to be discharged home. Neurovascular intact, dorsiflexion/plantar flexion intact, incision: dressing C/D/I, no cellulitis present and compartment soft.   LABS   No new labs   Discharge Exam: General appearance: alert, cooperative and no distress Extremities: Homans sign is negative, no sign of DVT, no edema, redness or tenderness in the calves or thighs and no ulcers, gangrene or trophic changes  Disposition: Home with follow up in 2 weeks  Follow-up Information    Follow up with OLIN,Rand Boller D in 2 weeks.   Contact information:   [facility] [address] [place] [address] [phone]          Discharge Orders    Future Appointments: Provider: Department: Dept Phone: Center:   05/27/2012 1:45 PM Wh-Dg Dexa 1 Wh-Diagnostic Rad [phone]   05/27/2012 2:15 PM Wh-Mm 1 Wh-Mammography [phone]     Future Orders Please Complete  By Expires   Diet - low sodium heart healthy      Call MD / Call 911      Comments:   If you experience chest pain or shortness of breath, CALL 911 and be transported to the hospital emergency room.  If you develope a fever above 101 F, pus (white drainage) or increased drainage or redness at the wound, or calf pain, call your surgeon's office.   Discharge instructions      Comments:   Maintain surgical  dressing for 8 days, then replace with gauze and tape. Keep the area dry and clean until follow up. Follow up in 2 weeks at [facility]. Call with any questions or concerns.     Constipation Prevention      Comments:   Drink plenty of fluids.  Prune juice may be helpful.  You may use a stool softener, such as Colace (over the counter) 100 mg twice a day.  Use MiraLax (over the counter) for constipation as needed.   Increase activity slowly as tolerated      Weight Bearing as taught in Physical Therapy      Comments:   Use a walker or crutches as instructed.   Driving restrictions      Comments:   No driving for 4 weeks   TED hose      Comments:   Use stockings (TED hose) for 2 weeks on both leg(s).  You may remove them at night for sleeping.   Change dressing      Comments:   Maintain surgical dressing for 8 days, then change the dressing daily with sterile 4 x 4 inch gauze dressing and tape. Keep the area dry and clean.      Current Discharge Medication List    START taking these medications   Details  aspirin EC 325 MG tablet Take 1 tablet (325 mg total) by mouth 2 (two) times daily. X 4 weeks Qty: 60 tablet, Refills: 0    celecoxib (CELEBREX) 200 MG capsule Take 1 capsule (200 mg total) by mouth 2 (two) times daily.    diphenhydrAMINE (BENADRYL) 25 mg capsule Take 1 capsule (25 mg total) by mouth every 6 (six) hours as needed for itching, allergies or sleep.    docusate sodium 100 MG CAPS Take 100 mg by mouth 2 (two) times daily.    ferrous sulfate 325 (65 FE) MG tablet Take 1 tablet (325 mg total) by mouth 3 (three) times daily after meals.    HYDROcodone-acetaminophen (NORCO) 7.5-325 MG per tablet Take 1-2 tablets by mouth every 4 (four) hours as needed for pain. Qty: 120 tablet, Refills: 0    methocarbamol (ROBAXIN) 500 MG tablet Take 1 tablet (500 mg total) by mouth every 6 (six) hours as needed (muscle spasms).    polyethylene glycol (MIRALAX / GLYCOLAX)  packet Take 17 g by mouth 2 (two) times daily.      CONTINUE these medications which have NOT CHANGED   Details  Garlic 100 MG TABS Take 100 mg by mouth daily with breakfast.     Multiple Vitamin (MULITIVITAMIN WITH MINERALS) TABS Take 1 tablet by mouth daily with breakfast.    omega-3 acid ethyl esters (LOVAZA) 1 G capsule Take 1 g by mouth daily with breakfast.    pantoprazole (PROTONIX) 40 MG tablet Take 40 mg by mouth daily with breakfast.    triamterene-hydrochlorothiazide (MAXZIDE) 75-50 MG per tablet Take 1 tablet by mouth daily with breakfast.    verapamil (  CALAN-SR) 240 MG CR tablet Take 240 mg by mouth daily with breakfast.    vitamin B-12 (CYANOCOBALAMIN) 1000 MCG tablet Take 1,000 mcg by mouth daily with breakfast.      STOP taking these medications     aspirin 81 MG chewable tablet Comments:  Reason for Stopping:          Signed:  Anastasio Auerbach. Madisyn Mawhinney   PAC  04/24/2012, 9:27 AM

## 2012-04-23 NOTE — Progress Notes (Signed)
Subjective: 2 Days Post-Op Procedure(s) (LRB): TOTAL KNEE ARTHROPLASTY (Right)   Patient reports pain as mild. Has episode of increased pain, but otherwise it is well controlled. No events throughout the night.  Objective:   VITALS:   Filed Vitals:   04/23/12 0542  BP: 129/78  Pulse: 60  Temp: 97.8 F (36.6 C)  Resp: 16    Neurovascular intact Dorsiflexion/Plantar flexion intact Incision: dressing C/D/I No cellulitis present Compartment soft  LABS  Basename 04/23/12 0430 04/22/12 0410  HGB 10.1* 10.4*  HCT 31.1* 31.7*  WBC 14.8* 13.8*  PLT 294 248     Basename 04/23/12 0430 04/22/12 0410  NA 134* 134*  K 4.4 4.1  BUN 15 11  CREATININE 0.59 0.49*  GLUCOSE 113* 115*     Assessment/Plan: 2 Days Post-Op Procedure(s) (LRB): TOTAL KNEE ARTHROPLASTY (Right)   Up with therapy Plan for discharge tomorrow to home   Anastasio Auerbach. Mykelle Cockerell   PAC  04/23/2012, 9:20 AM

## 2012-04-23 NOTE — Progress Notes (Signed)
Physical Therapy Treatment Patient Details Name: Shelly Leonard MRN: [id number] DOB: May 29, 1946 Today's Date: 04/23/2012 Time: 8469-6295 PT Time Calculation (min): 39 min  PT Assessment / Plan / Recommendation Comments on Treatment Session       Follow Up Recommendations  Home health PT    Equipment Recommendations  Tub/shower seat    Frequency 7X/week   Plan Discharge plan remains appropriate    Precautions / Restrictions Precautions Precautions: Knee;Fall Required Braces or Orthoses: Knee Immobilizer - Right Knee Immobilizer - Right: Discontinue once straight leg raise with < 10 degree lag Restrictions Weight Bearing Restrictions: No Other Position/Activity Restrictions: WBAT   Pertinent Vitals/Pain     Mobility  Bed Mobility Bed Mobility: Supine to Sit;Sit to Supine Supine to Sit: 4: Min guard Sit to Supine: 4: Min guard Details for Bed Mobility Assistance: min guard R LE Transfers Transfers: Sit to Stand;Stand to Sit Sit to Stand: With upper extremity assist;From bed;From toilet;4: Min assist Stand to Sit: 4: Min guard;With upper extremity assist;To bed;To toilet Details for Transfer Assistance: cues for use of UEs and for LE management Ambulation/Gait Ambulation/Gait Assistance: 4: Min guard;4: Min assist Ambulation Distance (Feet): 150 Feet (150 and 20x2) Assistive device: Rolling walker Ambulation/Gait Assistance Details: cues for sequence, position from RW and posture Gait Pattern: Step-to pattern    Exercises Total Joint Exercises Ankle Circles/Pumps: AROM;20 reps;Supine;Both Quad Sets: AROM;20 reps;Both;Supine Heel Slides: AAROM;20 reps;Right;Supine Straight Leg Raises: 20 reps;Right;Supine;AAROM;AROM   PT Goals Acute Rehab PT Goals PT Goal Formulation: With patient Time For Goal Achievement: 04/29/12 Potential to Achieve Goals: Good Pt will go Supine/Side to Sit: with supervision PT Goal: Supine/Side to Sit - Progress: Progressing toward  goal Pt will go Sit to Supine/Side: with supervision PT Goal: Sit to Supine/Side - Progress: Progressing toward goal Pt will go Sit to Stand: with supervision PT Goal: Sit to Stand - Progress: Progressing toward goal Pt will go Stand to Sit: with supervision PT Goal: Stand to Sit - Progress: Progressing toward goal Pt will Ambulate: 51 - 150 feet;with supervision;with rolling walker PT Goal: Ambulate - Progress: Progressing toward goal  Visit Information  Last PT Received On: 04/23/12 Assistance Needed: +1    Subjective Data  Subjective: I never get up before 10 at home   Cognition  Overall Cognitive Status: Appears within functional limits for tasks assessed/performed Arousal/Alertness: Awake/alert Orientation Level: Appears intact for tasks assessed Behavior During Session: [facility] for tasks performed    Balance     End of Session PT - End of Session Activity Tolerance: Patient tolerated treatment well Patient left: with call bell/phone within reach;with family/visitor present;in chair Nurse Communication: Mobility status    Jakylan Ron 04/23/2012, 1:07 PM

## 2012-04-24 NOTE — Progress Notes (Signed)
Subjective: 3 Days Post-Op Procedure(s) (LRB): TOTAL KNEE ARTHROPLASTY (Right)   Patient reports pain as mild. Pain well controlled. No events throughout the night. Ready to be discharged home.  Objective:   VITALS:   Filed Vitals:   04/24/12 0543  BP: 149/86  Pulse: 92  Temp: 98 F (36.7 C)  Resp: 16    Neurovascular intact Dorsiflexion/Plantar flexion intact Incision: dressing C/D/I No cellulitis present Compartment soft  LABS  Basename 04/23/12 0430 04/22/12 0410  HGB 10.1* 10.4*  HCT 31.1* 31.7*  WBC 14.8* 13.8*  PLT 294 248     Basename 04/23/12 0430 04/22/12 0410  NA 134* 134*  K 4.4 4.1  BUN 15 11  CREATININE 0.59 0.49*  GLUCOSE 113* 115*     Assessment/Plan: 3 Days Post-Op Procedure(s) (LRB): TOTAL KNEE ARTHROPLASTY (Right)   Up with therapy Discharge home with home health Follow up in 2 weeks at [facility].  Follow-up Information    Follow up with OLIN,Jecenia Leamer D in 2 weeks.   Contact information:   [facility] [address] [place] [address] [phone]          Anastasio Auerbach. Draiden Mirsky   PAC  04/24/2012, 9:10 AM

## 2012-04-24 NOTE — Progress Notes (Signed)
CARE MANAGEMENT NOTE 04/24/2012  Patient:  Shelly Leonard, Shelly Leonard   Account Number:  [id number]  Date Initiated:  04/22/2012  Documentation initiated by:  Colleen Can  Subjective/Objective Assessment:   dx endstage osteoarthritis right knee; total knee arthroplasty on day of admission  Worker'd comp-date of injury July 2010     Action/Plan:   CM spoke with patient. Plans are that patient will return to her home in [place] where her spouse and daughter will be caregivers. She already has RW but needs shower chair   Anticipated DC Date:  04/23/2012   Anticipated DC Plan:  HOME W HOME HEALTH SERVICES  In-house referral  NA      DC Planning Services  CM consult      [facility] Choice  HOME HEALTH  DURABLE MEDICAL EQUIPMENT   Choice offered to / List presented to:  C-1 Patient   DME arranged  OTHER - SEE COMMENT      DME agency  OTHER - SEE NOTE     HH arranged  HH-2 PT      HH agency  OTHER - SEE NOTE   Status of service:  Completed, signed off Medicare Important Message given?  NO (If response is "NO", the following Medicare IM given date fields will be blank) Date Medicare IM given:   Date Additional Medicare IM given:    Discharge Disposition:  HOME W HOME HEALTH SERVICES  Per UR Regulation:  Reviewed for med. necessity/level of care/duration of stay  Comments:  04/24/2012 Raynelle Bring BSN CCM [phone] CM soke with Worker's comp-CM and was advised that [facility] would be providing HHpt with start day of tomorrow. Needed DME will be delivered to patient's home . Pt for discharge today.  04/22/2012 Raynelle Bring BSN CCM [phone] Pt plans to have HHPT when she is discharged to home. She plans to use the Home health agency that worker's comp vendor sets her up with. Worker's comp case [employer] has been faxed Orders for HHpt, shower chair, last progress notes, Physical therapy notes, op report and H&P to [phone] with confirmation received. She  will call bck with information regarding vendor that is to be used.

## 2012-04-24 NOTE — Progress Notes (Signed)
Physical Therapy Treatment Patient Details Name: Shelly Leonard MRN: [id number] DOB: 1946-07-28 Today's Date: 04/24/2012 Time: 1914-7829 PT Time Calculation (min): 13 min  PT Assessment / Plan / Recommendation Comments on Treatment Session  PT session limited.  Pt initially motivated to participate but c/o increased pain - MEds requested and will return to complete session.    Follow Up Recommendations  Home health PT    Equipment Recommendations  None recommended by PT;Tub/shower seat    Frequency 7X/week   Plan Discharge plan remains appropriate    Precautions / Restrictions Precautions Precautions: Knee;Fall Required Braces or Orthoses: Knee Immobilizer - Right Knee Immobilizer - Right: Discontinue once straight leg raise with < 10 degree lag Restrictions Weight Bearing Restrictions: No Other Position/Activity Restrictions: WBAT   Pertinent Vitals/Pain 7-8/10 with activity.  MEDS requested    Mobility  Bed Mobility Bed Mobility: Supine to Sit;Sit to Supine Supine to Sit: 5: Supervision Sit to Supine: 4: Min guard Details for Bed Mobility Assistance: min guard R LE Transfers Transfers: Sit to Stand;Stand to Sit Sit to Stand: With upper extremity assist;From bed;From toilet;5: Supervision Stand to Sit: 4: Min guard;With upper extremity assist;To bed;To toilet;5: Supervision Details for Transfer Assistance: cues for use of UEs and for LE management Ambulation/Gait Ambulation/Gait Assistance: 5: Supervision Ambulation Distance (Feet): 25 Feet (25' x 2) Assistive device: Rolling walker Ambulation/Gait Assistance Details: cues for position from RW, posture, and sequence Gait Pattern: Step-to pattern;Step-through pattern Stairs:  (Pt states she is comfortable with ability on stairs)    Exercises Total Joint Exercises Ankle Circles/Pumps: AROM;20 reps;Supine;Both Quad Sets: AROM;20 reps;Both;Supine Straight Leg Raises: 20 reps;Right;Supine;AAROM;AROM   PT  Goals Acute Rehab PT Goals PT Goal Formulation: With patient Time For Goal Achievement: 04/29/12 Potential to Achieve Goals: Good Pt will go Supine/Side to Sit: with supervision PT Goal: Supine/Side to Sit - Progress: Met Pt will go Sit to Supine/Side: with supervision PT Goal: Sit to Supine/Side - Progress: Progressing toward goal Pt will go Sit to Stand: with supervision PT Goal: Sit to Stand - Progress: Met Pt will go Stand to Sit: with supervision PT Goal: Stand to Sit - Progress: Met Pt will Ambulate: 51 - 150 feet;with supervision;with rolling walker PT Goal: Ambulate - Progress: Progressing toward goal Pt will Go Up / Down Stairs: 3-5 stairs;with min assist;with least restrictive assistive device PT Goal: Up/Down Stairs - Progress: Met  Visit Information  Last PT Received On: 04/24/12 Assistance Needed: +1    Subjective Data  Subjective: My husband will be to get me around noon Patient Stated Goal: Resume previous lifestyle with decreased pain   Cognition  Overall Cognitive Status: Appears within functional limits for tasks assessed/performed Arousal/Alertness: Awake/alert Orientation Level: Appears intact for tasks assessed Behavior During Session: [facility] for tasks performed    Balance     End of Session PT - End of Session Patient left: with call bell/phone within reach;with family/visitor present;in bed Nurse Communication: Other (comment) (Pain meds requested)    Neida Ellegood 04/24/2012, 12:14 PM

## 2012-04-24 NOTE — Progress Notes (Signed)
Physical Therapy Treatment Patient Details Name: SORA VROOMAN MRN: [id number] DOB: September 13, 1946 Today's Date: 04/24/2012 Time: 3474-2595 PT Time Calculation (min): 31 min  PT Assessment / Plan / Recommendation Comments on Treatment Session  Pt with one episode balance loss while attempting to don dressing gown in standing.  Min assist to recover.  Discussed need to position with legs against bed/chair performing this and similar activites until balance improves    Follow Up Recommendations  Home health PT    Equipment Recommendations  None recommended by PT;Tub/shower seat    Frequency 7X/week   Plan Discharge plan remains appropriate    Precautions / Restrictions Precautions Precautions: Knee;Fall Required Braces or Orthoses: Knee Immobilizer - Right Knee Immobilizer - Right: Discontinue once straight leg raise with < 10 degree lag Restrictions Weight Bearing Restrictions: No Other Position/Activity Restrictions: WBAT   Pertinent Vitals/Pain 4-5/10    Mobility  Bed Mobility Bed Mobility: Supine to Sit;Sit to Supine Supine to Sit: 5: Supervision Sit to Supine: 4: Min guard Details for Bed Mobility Assistance: min guard R LE Transfers Transfers: Sit to Stand;Stand to Sit Sit to Stand: With upper extremity assist;From bed;From toilet;5: Supervision Stand to Sit: 4: Min guard;With upper extremity assist;To bed;To toilet;5: Supervision Details for Transfer Assistance: cues for position against bed prior to sitting Ambulation/Gait Ambulation/Gait Assistance: 5: Supervision Ambulation Distance (Feet): 145 Feet Assistive device: Rolling walker Ambulation/Gait Assistance Details: cues for posture and position from RW Gait Pattern: Step-to pattern;Step-through pattern Stairs:  (Pt states she is comfortable with ability on stairs)    Exercises Total Joint Exercises Ankle Circles/Pumps: AROM;20 reps;Supine;Both Quad Sets: AROM;20 reps;Both;Supine Short Arc Quad:  AROM;AAROM;Both;10 reps;5 reps;Supine Heel Slides: AAROM;20 reps;Right;Supine Straight Leg Raises: 20 reps;Right;Supine;AAROM;AROM   PT Goals Acute Rehab PT Goals PT Goal Formulation: With patient Time For Goal Achievement: 04/29/12 Potential to Achieve Goals: Good Pt will go Supine/Side to Sit: with supervision PT Goal: Supine/Side to Sit - Progress: Met Pt will go Sit to Supine/Side: with supervision PT Goal: Sit to Supine/Side - Progress: Progressing toward goal Pt will go Sit to Stand: with supervision PT Goal: Sit to Stand - Progress: Met Pt will go Stand to Sit: with supervision PT Goal: Stand to Sit - Progress: Met Pt will Ambulate: 51 - 150 feet;with supervision;with rolling walker PT Goal: Ambulate - Progress: Met Pt will Go Up / Down Stairs: 3-5 stairs;with min assist;with least restrictive assistive device PT Goal: Up/Down Stairs - Progress: Met  Visit Information  Last PT Received On: 04/24/12 Assistance Needed: +1    Subjective Data  Subjective: I'm feeling better Patient Stated Goal: Resume previous lifestyle with decreased pain   Cognition  Overall Cognitive Status: Appears within functional limits for tasks assessed/performed Arousal/Alertness: Awake/alert Orientation Level: Appears intact for tasks assessed Behavior During Session: [facility] for tasks performed    Balance     End of Session PT - End of Session Activity Tolerance: Patient tolerated treatment well Patient left: with call bell/phone within reach;with family/visitor present;in bed Nurse Communication: Mobility status    Judiann Celia 04/24/2012, 12:23 PM

## 2012-05-22 ENCOUNTER — Ambulatory Visit (HOSPITAL_COMMUNITY): Payer: Self-pay

## 2012-05-27 ENCOUNTER — Ambulatory Visit (HOSPITAL_COMMUNITY)
Admission: RE | Admit: 2012-05-27 | Discharge: 2012-05-27 | Disposition: A | Discharge status: Home / Self Care | Payer: Self-pay | Source: Ambulatory Visit | Attending: Family Medicine | Admitting: Family Medicine

## 2012-05-27 DIAGNOSIS — Z1231 Encounter for screening mammogram for malignant neoplasm of breast: Secondary | ICD-10-CM

## 2012-05-27 DIAGNOSIS — Z13820 Encounter for screening for osteoporosis: Secondary | ICD-10-CM | POA: Insufficient documentation

## 2012-05-27 DIAGNOSIS — Z780 Asymptomatic menopausal state: Secondary | ICD-10-CM | POA: Insufficient documentation

## 2012-11-11 LAB — IRON
IRON SATURATION: 10
IRON: 38
Iron Bind.Cap.(Total): 393
Transferrin: 281

## 2013-04-05 IMAGING — CR DG CHEST 2V
2 series · 2 of 2 positions shown · non-contrast
Comparison: 09/15/2004

CLINICAL DATA: Preop right total knee

CHEST - 2 VIEW

[w chest pa]
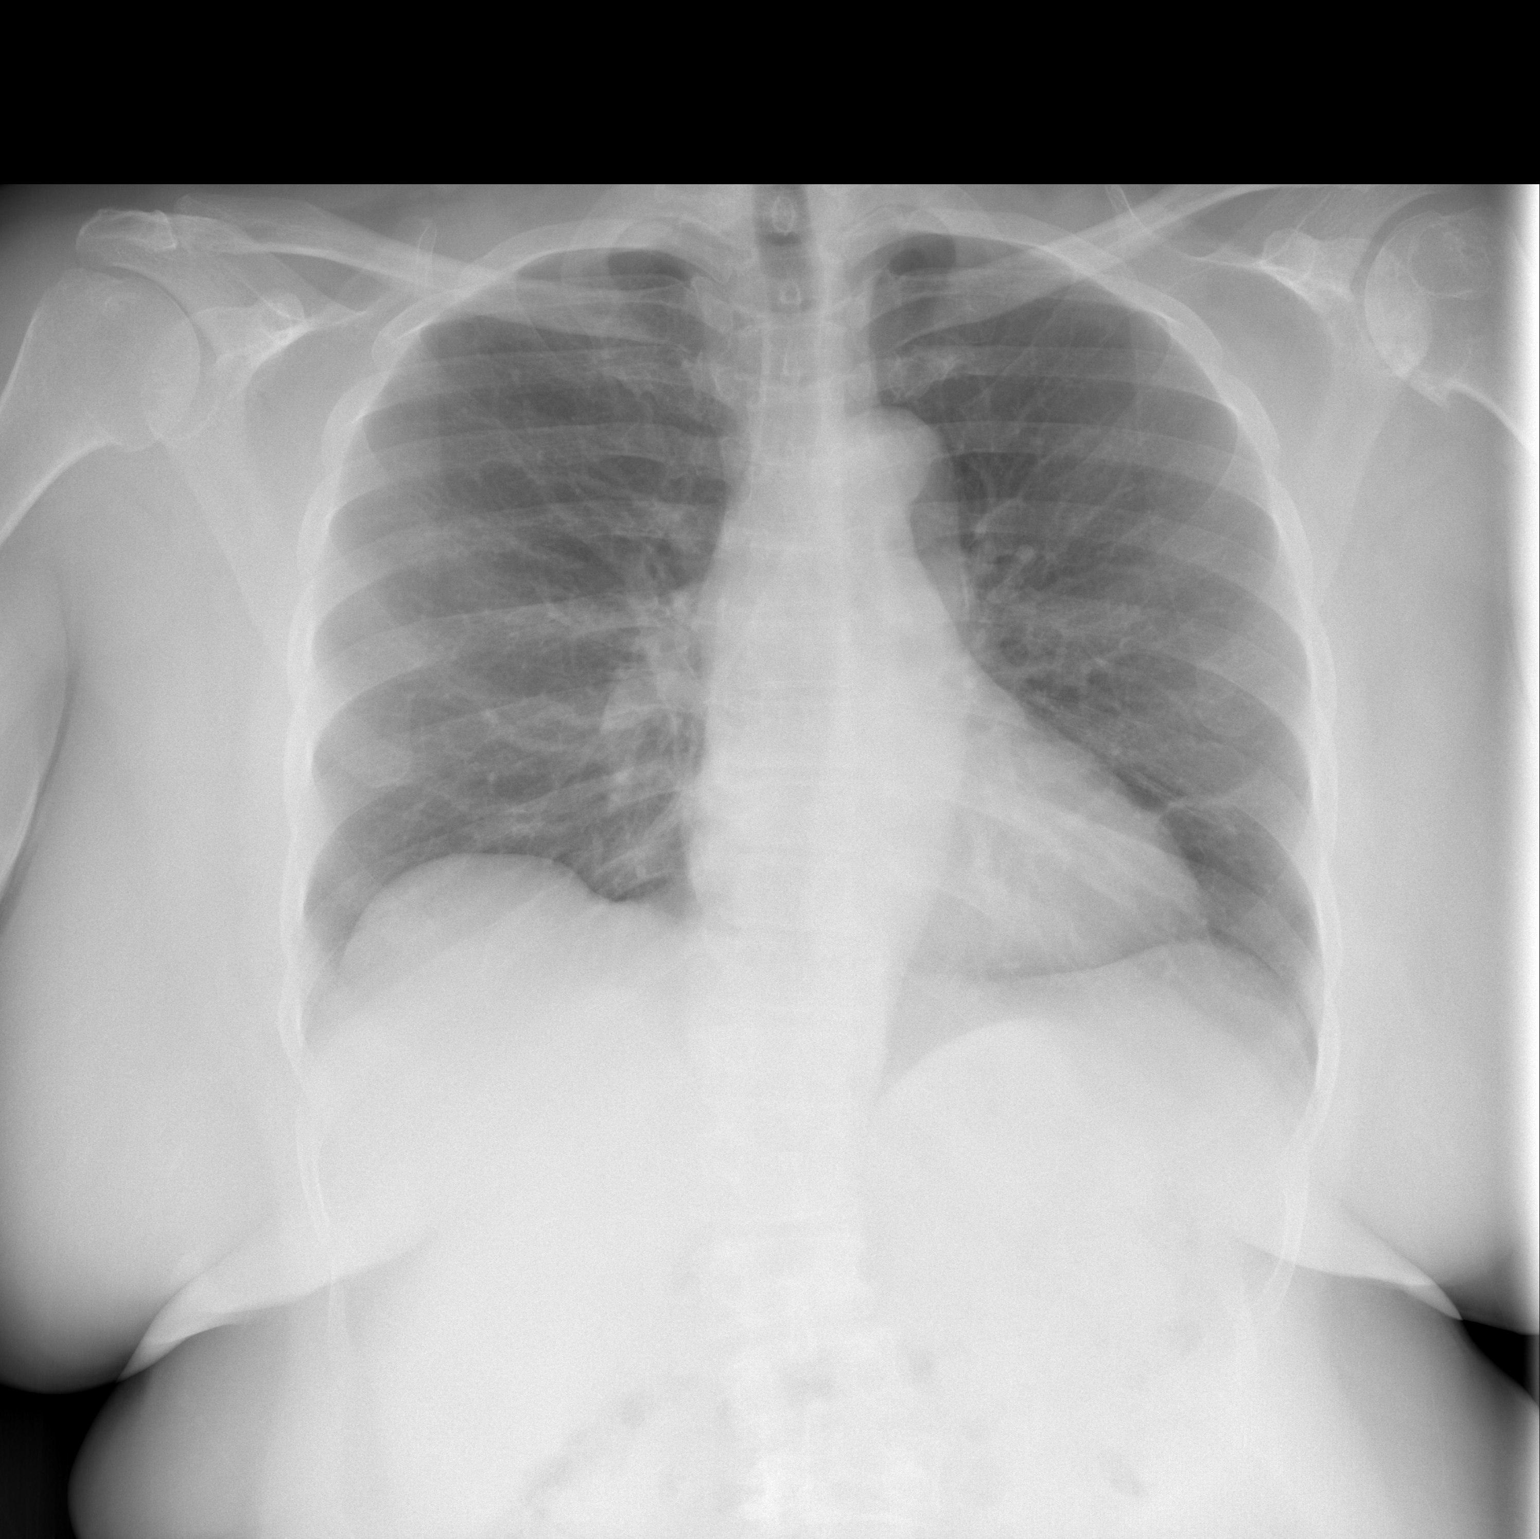

[w chest lat]
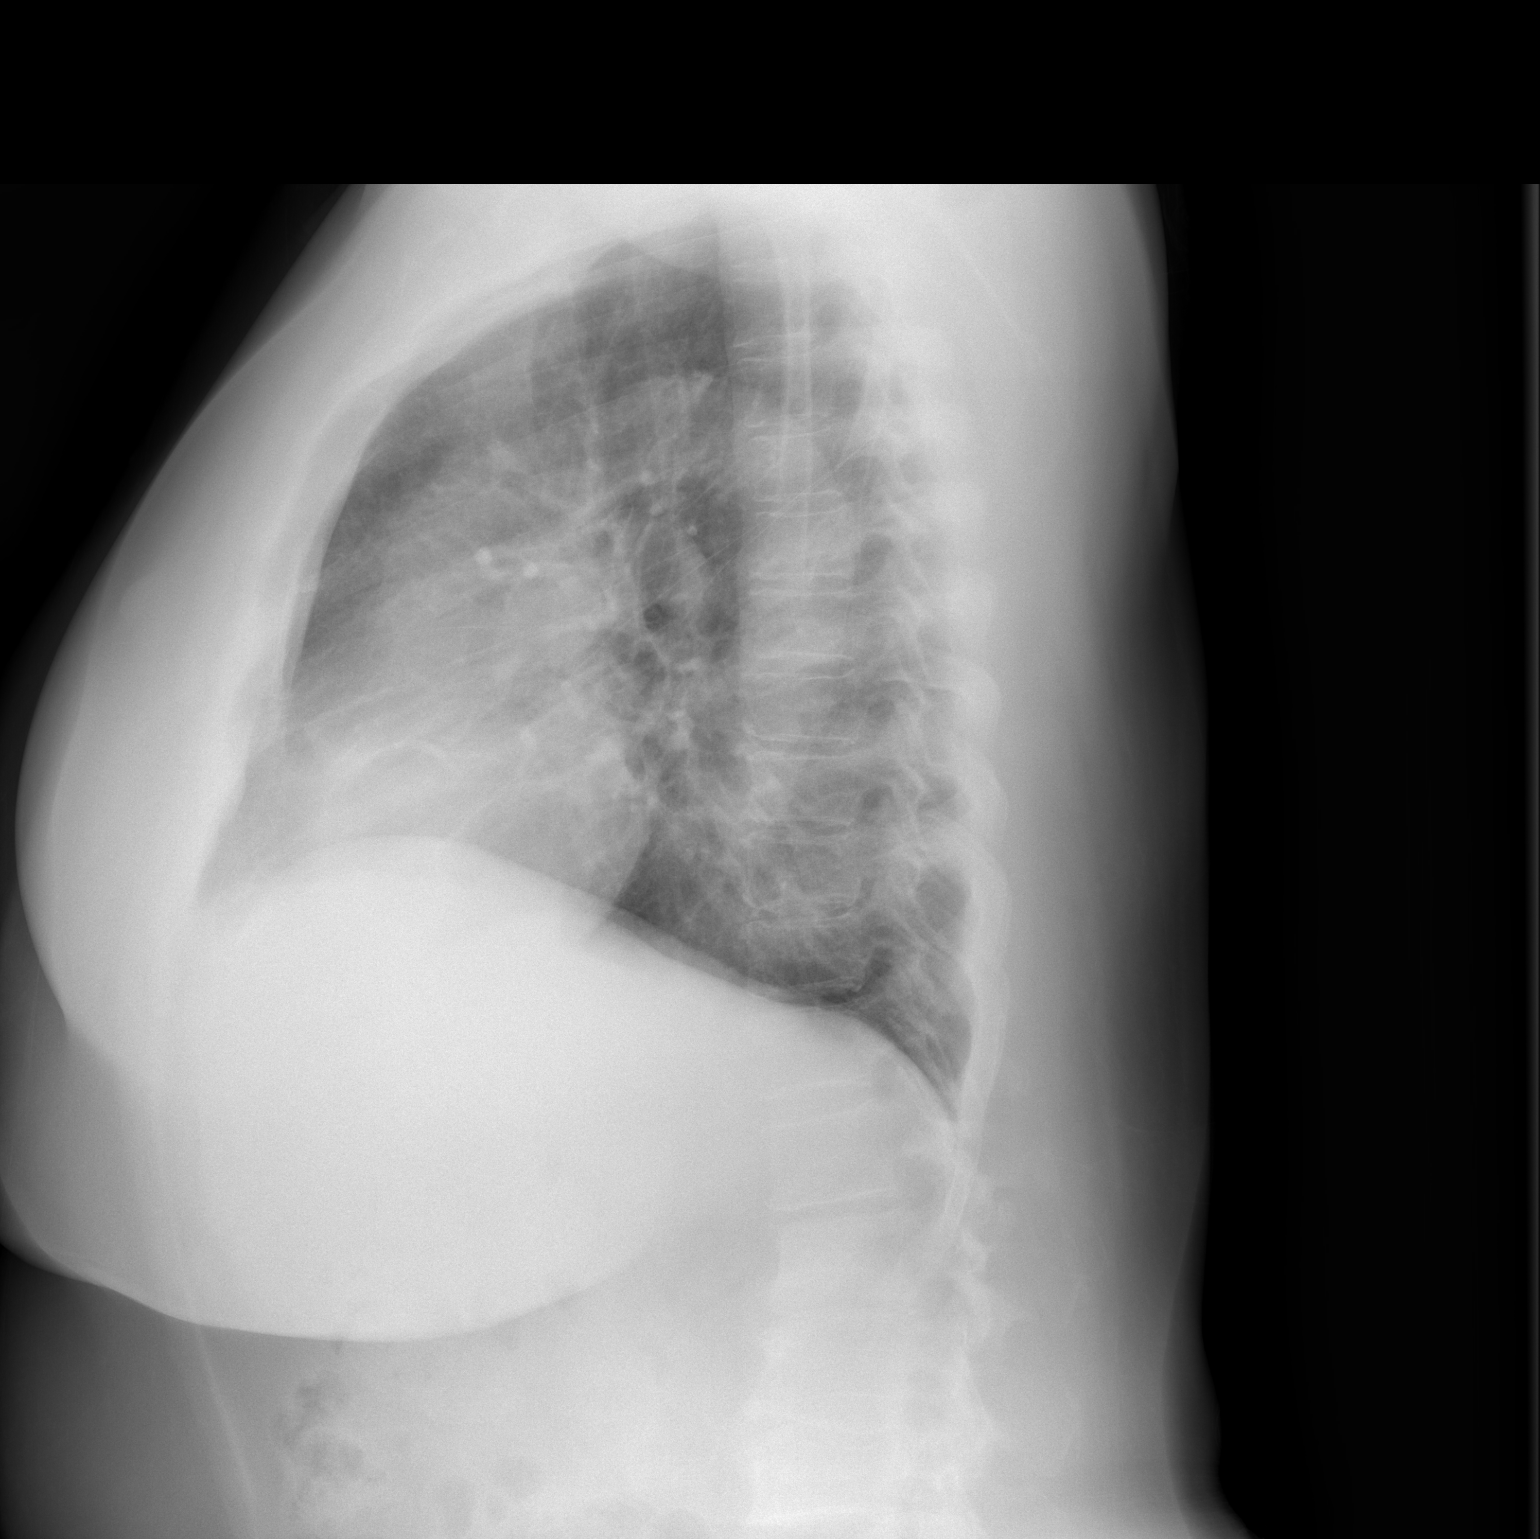

[2 of 2 positions shown; findings below may reference images not displayed]

FINDINGS: Lungs are essentially clear.  No pleural effusion or
pneumothorax.

Cardiomediastinal silhouette is within normal limits.

Mild degenerative changes of the visualized thoracolumbar spine.
IMPRESSION: No evidence of acute cardiopulmonary disease.

## 2014-01-19 ENCOUNTER — Other Ambulatory Visit (HOSPITAL_COMMUNITY): Payer: Self-pay | Admitting: Family Medicine

## 2014-01-19 DIAGNOSIS — Z1231 Encounter for screening mammogram for malignant neoplasm of breast: Secondary | ICD-10-CM

## 2014-01-27 ENCOUNTER — Ambulatory Visit (HOSPITAL_COMMUNITY)
Admission: RE | Admit: 2014-01-27 | Discharge: 2014-01-27 | Disposition: A | Discharge status: Home / Self Care | Payer: Medicare HMO | Source: Ambulatory Visit | Attending: Family Medicine | Admitting: Family Medicine

## 2014-01-27 DIAGNOSIS — Z1231 Encounter for screening mammogram for malignant neoplasm of breast: Secondary | ICD-10-CM | POA: Insufficient documentation

## 2014-04-01 LAB — LIPID PANEL
CHOL/HDL RATIO: 3.3
Cholesterol: 173
HDL: 121
LDL (calc): 52
Triglycerides: 90

## 2014-04-01 LAB — TSH: TSH: 0.59

## 2014-04-02 ENCOUNTER — Other Ambulatory Visit (HOSPITAL_COMMUNITY): Payer: Self-pay | Admitting: Family Medicine

## 2014-04-02 DIAGNOSIS — M949 Disorder of cartilage, unspecified: Principal | ICD-10-CM

## 2014-04-02 DIAGNOSIS — M899 Disorder of bone, unspecified: Secondary | ICD-10-CM

## 2014-06-02 ENCOUNTER — Ambulatory Visit (HOSPITAL_COMMUNITY): Payer: PRIVATE HEALTH INSURANCE

## 2014-06-03 ENCOUNTER — Ambulatory Visit (HOSPITAL_COMMUNITY)
Admission: RE | Admit: 2014-06-03 | Discharge: 2014-06-03 | Disposition: A | Discharge status: Home / Self Care | Payer: Medicare HMO | Source: Ambulatory Visit | Attending: Family Medicine | Admitting: Family Medicine

## 2014-06-03 DIAGNOSIS — Z780 Asymptomatic menopausal state: Secondary | ICD-10-CM | POA: Diagnosis not present

## 2014-06-03 DIAGNOSIS — M949 Disorder of cartilage, unspecified: Secondary | ICD-10-CM

## 2014-06-03 DIAGNOSIS — M899 Disorder of bone, unspecified: Secondary | ICD-10-CM

## 2014-06-03 DIAGNOSIS — Z13820 Encounter for screening for osteoporosis: Secondary | ICD-10-CM | POA: Insufficient documentation

## 2015-02-13 ENCOUNTER — Emergency Department (HOSPITAL_COMMUNITY): Payer: Medicare HMO

## 2015-02-13 ENCOUNTER — Encounter (HOSPITAL_COMMUNITY): Payer: Self-pay

## 2015-02-13 ENCOUNTER — Emergency Department (HOSPITAL_COMMUNITY)
Admission: EM | Admit: 2015-02-13 | Discharge: 2015-02-13 | Disposition: A | Discharge status: Home / Self Care | Payer: Medicare HMO | Attending: Emergency Medicine | Admitting: Emergency Medicine

## 2015-02-13 DIAGNOSIS — K5732 Diverticulitis of large intestine without perforation or abscess without bleeding: Secondary | ICD-10-CM | POA: Insufficient documentation

## 2015-02-13 DIAGNOSIS — I10 Essential (primary) hypertension: Secondary | ICD-10-CM | POA: Insufficient documentation

## 2015-02-13 DIAGNOSIS — R1032 Left lower quadrant pain: Secondary | ICD-10-CM | POA: Diagnosis present

## 2015-02-13 DIAGNOSIS — R109 Unspecified abdominal pain: Secondary | ICD-10-CM

## 2015-02-13 DIAGNOSIS — K219 Gastro-esophageal reflux disease without esophagitis: Secondary | ICD-10-CM | POA: Insufficient documentation

## 2015-02-13 DIAGNOSIS — Z8739 Personal history of other diseases of the musculoskeletal system and connective tissue: Secondary | ICD-10-CM | POA: Insufficient documentation

## 2015-02-13 DIAGNOSIS — Z79899 Other long term (current) drug therapy: Secondary | ICD-10-CM | POA: Insufficient documentation

## 2015-02-13 DIAGNOSIS — E876 Hypokalemia: Secondary | ICD-10-CM | POA: Diagnosis not present

## 2015-02-13 DIAGNOSIS — D509 Iron deficiency anemia, unspecified: Secondary | ICD-10-CM | POA: Insufficient documentation

## 2015-02-13 LAB — COMPREHENSIVE METABOLIC PANEL
ALK PHOS: 47 U/L (ref 39–117)
ALT: 19 U/L (ref 0–35)
ANION GAP: 9 (ref 5–15)
AST: 22 U/L (ref 0–37)
Albumin: 3.6 g/dL (ref 3.5–5.2)
BUN: 18 mg/dL (ref 6–23)
CALCIUM: 10.1 mg/dL (ref 8.4–10.5)
CO2: 25 mmol/L (ref 19–32)
CREATININE: 0.96 mg/dL (ref 0.50–1.10)
Chloride: 100 mmol/L (ref 96–112)
GFR calc non Af Amer: 60 mL/min — ABNORMAL LOW (ref >90)
GFR, EST AFRICAN AMERICAN: 69 mL/min — AB (ref >90)
Glucose, Bld: 142 mg/dL — ABNORMAL HIGH (ref 70–99)
Potassium: 2.7 mmol/L — CL (ref 3.5–5.1)
Sodium: 134 mmol/L — ABNORMAL LOW (ref 135–145)
TOTAL PROTEIN: 7.6 g/dL (ref 6.0–8.3)
Total Bilirubin: 1 mg/dL (ref 0.3–1.2)

## 2015-02-13 LAB — CBC WITH DIFFERENTIAL/PLATELET
BASOS ABS: 0 10*3/uL (ref 0.0–0.1)
Basophils Relative: 0 % (ref 0–1)
EOS PCT: 0 % (ref 0–5)
Eosinophils Absolute: 0 10*3/uL (ref 0.0–0.7)
HEMATOCRIT: 35.6 % — AB (ref 36.0–46.0)
Hemoglobin: 11.7 g/dL — ABNORMAL LOW (ref 12.0–15.0)
LYMPHS PCT: 13 % (ref 12–46)
Lymphs Abs: 2.4 10*3/uL (ref 0.7–4.0)
MCH: 27.3 pg (ref 26.0–34.0)
MCHC: 32.9 g/dL (ref 30.0–36.0)
MCV: 83.2 fL (ref 78.0–100.0)
Monocytes Absolute: 1.2 10*3/uL — ABNORMAL HIGH (ref 0.1–1.0)
Monocytes Relative: 7 % (ref 3–12)
NEUTROS PCT: 80 % — AB (ref 43–77)
Neutro Abs: 15 10*3/uL — ABNORMAL HIGH (ref 1.7–7.7)
Platelets: 289 10*3/uL (ref 150–400)
RBC: 4.28 MIL/uL (ref 3.87–5.11)
RDW: 15.4 % (ref 11.5–15.5)
WBC: 18.6 10*3/uL — AB (ref 4.0–10.5)

## 2015-02-13 LAB — URINALYSIS, ROUTINE W REFLEX MICROSCOPIC
BILIRUBIN URINE: NEGATIVE
GLUCOSE, UA: NEGATIVE mg/dL
Hgb urine dipstick: NEGATIVE
Ketones, ur: NEGATIVE mg/dL
Leukocytes, UA: NEGATIVE
NITRITE: NEGATIVE
PROTEIN: NEGATIVE mg/dL
SPECIFIC GRAVITY, URINE: 1.028 (ref 1.005–1.030)
Urobilinogen, UA: 0.2 mg/dL (ref 0.0–1.0)
pH: 7.5 (ref 5.0–8.0)

## 2015-02-13 LAB — LIPASE, BLOOD: Lipase: 16 U/L (ref 11–59)

## 2015-02-13 MED ORDER — FENTANYL CITRATE 0.05 MG/ML IJ SOLN
50.0000 ug | Freq: Once | INTRAMUSCULAR | Status: AC
  Administered 2015-02-13: 50 ug via INTRAVENOUS
  Filled 2015-02-13: qty 2

## 2015-02-13 MED ORDER — SODIUM CHLORIDE 0.9 % IV SOLN
INTRAVENOUS | Status: DC
  Administered 2015-02-13: 13:00:00 via INTRAVENOUS

## 2015-02-13 MED ORDER — IOHEXOL 300 MG/ML  SOLN
50.0000 mL | Freq: Once | INTRAMUSCULAR | Status: AC | PRN
  Administered 2015-02-13: 50 mL via ORAL

## 2015-02-13 MED ORDER — ONDANSETRON HCL 4 MG/2ML IJ SOLN
4.0000 mg | Freq: Once | INTRAMUSCULAR | Status: AC
  Administered 2015-02-13: 4 mg via INTRAVENOUS
  Filled 2015-02-13: qty 2

## 2015-02-13 MED ORDER — SODIUM CHLORIDE 0.9 % IV BOLUS (SEPSIS): 1000.0000 mL | Freq: Once | INTRAVENOUS | Status: AC

## 2015-02-13 MED ORDER — METRONIDAZOLE 500 MG PO TABS: 500.0000 mg | ORAL_TABLET | Freq: Two times a day (BID) | ORAL | Status: DC

## 2015-02-13 MED ORDER — METOCLOPRAMIDE HCL 5 MG/ML IJ SOLN
5.0000 mg | Freq: Once | INTRAMUSCULAR | Status: AC
  Administered 2015-02-13: 5 mg via INTRAVENOUS
  Filled 2015-02-13: qty 2

## 2015-02-13 MED ORDER — DIPHENHYDRAMINE HCL 50 MG/ML IJ SOLN
25.0000 mg | Freq: Once | INTRAMUSCULAR | Status: AC
  Administered 2015-02-13: 25 mg via INTRAVENOUS
  Filled 2015-02-13: qty 1

## 2015-02-13 MED ORDER — SODIUM CHLORIDE 0.9 % IV SOLN
1000.0000 mL | Freq: Once | INTRAVENOUS | Status: AC
  Administered 2015-02-13: 1000 mL via INTRAVENOUS

## 2015-02-13 MED ORDER — IOHEXOL 300 MG/ML  SOLN
100.0000 mL | Freq: Once | INTRAMUSCULAR | Status: AC | PRN
  Administered 2015-02-13: 100 mL via INTRAVENOUS

## 2015-02-13 MED ORDER — ONDANSETRON HCL 4 MG/2ML IJ SOLN: 4.0000 mg | Freq: Once | INTRAMUSCULAR | Status: AC

## 2015-02-13 MED ORDER — CIPROFLOXACIN IN D5W 400 MG/200ML IV SOLN
400.0000 mg | Freq: Once | INTRAVENOUS | Status: AC
  Administered 2015-02-13: 400 mg via INTRAVENOUS
  Filled 2015-02-13: qty 200

## 2015-02-13 MED ORDER — METRONIDAZOLE IN NACL 5-0.79 MG/ML-% IV SOLN
500.0000 mg | Freq: Once | INTRAVENOUS | Status: AC
  Administered 2015-02-13: 500 mg via INTRAVENOUS
  Filled 2015-02-13: qty 100

## 2015-02-13 MED ORDER — POTASSIUM CHLORIDE CRYS ER 20 MEQ PO TBCR
40.0000 meq | EXTENDED_RELEASE_TABLET | Freq: Once | ORAL | Status: AC
  Administered 2015-02-13: 40 meq via ORAL
  Filled 2015-02-13: qty 2

## 2015-02-13 MED ORDER — CIPROFLOXACIN HCL 250 MG PO TABS: 250.0000 mg | ORAL_TABLET | Freq: Two times a day (BID) | ORAL | Status: DC

## 2015-02-13 MED ORDER — ONDANSETRON 8 MG PO TBDP: 8.0000 mg | ORAL_TABLET | Freq: Three times a day (TID) | ORAL | Status: DC | PRN

## 2015-02-13 MED ORDER — OXYCODONE-ACETAMINOPHEN 5-325 MG PO TABS: 1.0000 | ORAL_TABLET | ORAL | Status: DC | PRN

## 2015-02-13 NOTE — ED Notes (Signed)
Patient transported to CT 

## 2015-02-13 NOTE — ED Notes (Signed)
Per pt, abdominal pain with nausea since yesterday.  Pt states no fever.  No change in urination.  Slight constipation.  Felt flush at church and came here

## 2015-02-13 NOTE — Discharge Instructions (Signed)
Call your doctor next week to schedule a follow up visit about your low potassium and diverticulitis Diverticulitis Diverticulitis is inflammation or infection of small pouches in your colon that form when you have a condition called diverticulosis. The pouches in your colon are called diverticula. Your colon, or large intestine, is where water is absorbed and stool is formed. Complications of diverticulitis can include:  Bleeding.  Severe infection.  Severe pain.  Perforation of your colon.  Obstruction of your colon. CAUSES  Diverticulitis is caused by bacteria. Diverticulitis happens when stool becomes trapped in diverticula. This allows bacteria to grow in the diverticula, which can lead to inflammation and infection. RISK FACTORS People with diverticulosis are at risk for diverticulitis. Eating a diet that does not include enough fiber from fruits and vegetables may make diverticulitis more likely to develop. SYMPTOMS  Symptoms of diverticulitis may include:  Abdominal pain and tenderness. The pain is normally located on the left side of the abdomen, but may occur in other areas.  Fever and chills.  Bloating.  Cramping.  Nausea.  Vomiting.  Constipation.  Diarrhea.  Blood in your stool. DIAGNOSIS  Your health care provider will ask you about your medical history and do a physical exam. You may need to have tests done because many medical conditions can cause the same symptoms as diverticulitis. Tests may include:  Blood tests.  Urine tests.  Imaging tests of the abdomen, including X-rays and CT scans. When your condition is under control, your health care provider may recommend that you have a colonoscopy. A colonoscopy can show how severe your diverticula are and whether something else is causing your symptoms. TREATMENT  Most cases of diverticulitis are mild and can be treated at home. Treatment may include:  Taking over-the-counter pain  medicines.  Following a clear liquid diet.  Taking antibiotic medicines by mouth for 7-10 days. More severe cases may be treated at a hospital. Treatment may include:  Not eating or drinking.  Taking prescription pain medicine.  Receiving antibiotic medicines through an IV tube.  Receiving fluids and nutrition through an IV tube.  Surgery. HOME CARE INSTRUCTIONS   Follow your health care provider's instructions carefully.  Follow a full liquid diet or other diet as directed by your health care provider. After your symptoms improve, your health care provider may tell you to change your diet. He or she may recommend you eat a high-fiber diet. Fruits and vegetables are good sources of fiber. Fiber makes it easier to pass stool.  Take fiber supplements or probiotics as directed by your health care provider.  Only take medicines as directed by your health care provider.  Keep all your follow-up appointments. SEEK MEDICAL CARE IF:   Your pain does not improve.  You have a hard time eating food.  Your bowel movements do not return to normal. SEEK IMMEDIATE MEDICAL CARE IF:   Your pain becomes worse.  Your symptoms do not get better.  Your symptoms suddenly get worse.  You have a fever.  You have repeated vomiting.  You have bloody or black, tarry stools. MAKE SURE YOU:   Understand these instructions.  Will watch your condition.  Will get help right away if you are not doing well or get worse. Document Released: 09/19/2005 Document Revised: 12/15/2013 Document Reviewed: 11/04/2013 [facility] Patient Information 2015 [place], [place]. This information is not intended to replace advice given to you by your health care provider. Make sure you discuss any questions you have with your  health care provider. Hypokalemia Hypokalemia means that the amount of potassium in the blood is lower than normal.Potassium is a chemical, called an electrolyte, that helps regulate the  amount of fluid in the body. It also stimulates muscle contraction and helps nerves function properly.Most of the body's potassium is inside of cells, and only a very small amount is in the blood. Because the amount in the blood is so small, minor changes can be life-threatening. CAUSES  Antibiotics.  Diarrhea or vomiting.  Using laxatives too much, which can cause diarrhea.  Chronic kidney disease.  Water pills (diuretics).  Eating disorders (bulimia).  Low magnesium level.  Sweating a lot. SIGNS AND SYMPTOMS  Weakness.  Constipation.  Fatigue.  Muscle cramps.  Mental confusion.  Skipped heartbeats or irregular heartbeat (palpitations).  Tingling or numbness. DIAGNOSIS  Your health care provider can diagnose hypokalemia with blood tests. In addition to checking your potassium level, your health care provider may also check other lab tests. TREATMENT Hypokalemia can be treated with potassium supplements taken by mouth or adjustments in your current medicines. If your potassium level is very low, you may need to get potassium through a vein (IV) and be monitored in the hospital. A diet high in potassium is also helpful. Foods high in potassium are:  Nuts, such as peanuts and pistachios.  Seeds, such as sunflower seeds and pumpkin seeds.  Peas, lentils, and lima beans.  Whole grain and bran cereals and breads.  Fresh fruit and vegetables, such as apricots, avocado, bananas, cantaloupe, kiwi, oranges, tomatoes, asparagus, and potatoes.  Orange and tomato juices.  Red meats.  Fruit yogurt. HOME CARE INSTRUCTIONS  Take all medicines as prescribed by your health care provider.  Maintain a healthy diet by including nutritious food, such as fruits, vegetables, nuts, whole grains, and lean meats.  If you are taking a laxative, be sure to follow the directions on the label. SEEK MEDICAL CARE IF:  Your weakness gets worse.  You feel your heart pounding or  racing.  You are vomiting or having diarrhea.  You are diabetic and having trouble keeping your blood glucose in the normal range. SEEK IMMEDIATE MEDICAL CARE IF:  You have chest pain, shortness of breath, or dizziness.  You are vomiting or having diarrhea for more than 2 days.  You faint. MAKE SURE YOU:   Understand these instructions.  Will watch your condition.  Will get help right away if you are not doing well or get worse. Document Released: 12/10/2005 Document Revised: 09/30/2013 Document Reviewed: 06/12/2013 [facility] Patient Information 2015 [place], [place]. This information is not intended to replace advice given to you by your health care provider. Make sure you discuss any questions you have with your health care provider.

## 2015-02-13 NOTE — ED Provider Notes (Signed)
CSN: [id number]     Arrival date & time 02/13/15  1034 History   First MD Initiated Contact with Patient 02/13/15 1058     Chief Complaint  Patient presents with  . Abdominal Pain     (Consider location/radiation/quality/duration/timing/severity/associated sxs/prior Treatment) Patient is a 69 y.o. female presenting with abdominal pain. The history is provided by the patient.  Abdominal Pain Pain location:  LLQ Pain quality: aching   Pain radiates to:  Does not radiate Pain severity:  Moderate Onset quality:  Gradual Duration:  2 days Timing:  Constant Progression:  Worsening Chronicity:  New Context: not retching   Relieved by:  Nothing Worsened by:  Nothing tried Ineffective treatments:  None tried Associated symptoms: constipation and nausea   Associated symptoms: no diarrhea, no dysuria, no fever, no hematuria and no vomiting     Past Medical History  Diagnosis Date  . PONV (postoperative nausea and vomiting)   . Hypertension   . Anemia     low iron per pt   . GERD (gastroesophageal reflux disease)   . Headache(784.0)     occasional   . Arthritis     knees and hands    Past Surgical History  Procedure Laterality Date  . Knee arthroscopy      right knee 2012   . Carpal tunnel release      left   . Other surgical history      spur removed top of left foot   . Breast reduction surgery    . Total knee arthroplasty  04/21/2012    Procedure: TOTAL KNEE ARTHROPLASTY;  Surgeon: [name], MD;  Location: [facility];  Service: Orthopedics;  Laterality: Right;   History reviewed. No pertinent family history. History  Substance Use Topics  . Smoking status: Never Smoker   . Smokeless tobacco: Never Used  . Alcohol Use: No   OB History    No data available     Review of Systems  Constitutional: Negative for fever.  Gastrointestinal: Positive for nausea, abdominal pain and constipation. Negative for vomiting and diarrhea.  Genitourinary: Negative for dysuria  and hematuria.  All other systems reviewed and are negative.     Allergies  Review of patient's allergies indicates no known allergies.  Home Medications   Prior to Admission medications   Medication Sig Start Date End Date Taking? Authorizing Provider  diphenhydrAMINE (BENADRYL) 25 mg capsule Take 1 capsule (25 mg total) by mouth every 6 (six) hours as needed for itching, allergies or sleep. 04/23/12 05/03/12  Lucille Passy Babish, PA-C  ferrous sulfate 325 (65 FE) MG tablet Take 1 tablet (325 mg total) by mouth 3 (three) times daily after meals. 04/23/12 04/23/13  Lucille Passy Babish, PA-C  Garlic 536 MG TABS Take 100 mg by mouth daily with breakfast.     Historical Provider, MD  Multiple Vitamin (MULITIVITAMIN WITH MINERALS) TABS Take 1 tablet by mouth daily with breakfast.    Historical Provider, MD  omega-3 acid ethyl esters (LOVAZA) 1 G capsule Take 1 g by mouth daily with breakfast.    Historical Provider, MD  pantoprazole (PROTONIX) 40 MG tablet Take 40 mg by mouth daily with breakfast.    Historical Provider, MD  triamterene-hydrochlorothiazide (MAXZIDE) 75-50 MG per tablet Take 1 tablet by mouth daily with breakfast.    Historical Provider, MD  verapamil (CALAN-SR) 240 MG CR tablet Take 240 mg by mouth daily with breakfast.    Historical Provider, MD  vitamin B-12 (CYANOCOBALAMIN) 1000  MCG tablet Take 1,000 mcg by mouth daily with breakfast.    Historical Provider, MD   BP 94/61 mmHg  Pulse 58  Temp(Src) 98 F (36.7 C) (Oral)  Resp 20  SpO2 98% Physical Exam  Constitutional: She is oriented to person, place, and time. She appears well-developed and well-nourished.  Non-toxic appearance. No distress.  HENT:  Head: Normocephalic and atraumatic.  Eyes: Conjunctivae, EOM and lids are normal. Pupils are equal, round, and reactive to light.  Neck: Normal range of motion. Neck supple. No tracheal deviation present. No thyroid mass present.  Cardiovascular: Normal rate, regular rhythm  and normal heart sounds.  Exam reveals no gallop.   No murmur heard. Pulmonary/Chest: Effort normal and breath sounds normal. No stridor. No respiratory distress. She has no decreased breath sounds. She has no wheezes. She has no rhonchi. She has no rales.  Abdominal: Soft. Normal appearance and bowel sounds are normal. She exhibits no distension. There is tenderness in the left lower quadrant. There is no rebound and no CVA tenderness.    Musculoskeletal: Normal range of motion. She exhibits no edema or tenderness.  Neurological: She is alert and oriented to person, place, and time. She has normal strength. No cranial nerve deficit or sensory deficit. GCS eye subscore is 4. GCS verbal subscore is 5. GCS motor subscore is 6.  Skin: Skin is warm and dry. No abrasion and no rash noted.  Psychiatric: She has a normal mood and affect. Her speech is normal and behavior is normal.  Nursing note and vitals reviewed.   ED Course  Procedures (including critical care time) Labs Review Labs Reviewed  CBC WITH DIFFERENTIAL/PLATELET - Abnormal; Notable for the following:    WBC 18.6 (*)    Hemoglobin 11.7 (*)    HCT 35.6 (*)    Neutrophils Relative % 80 (*)    Neutro Abs 15.0 (*)    Monocytes Absolute 1.2 (*)    All other components within normal limits  COMPREHENSIVE METABOLIC PANEL  LIPASE, BLOOD  URINALYSIS, ROUTINE W REFLEX MICROSCOPIC    Imaging Review No results found.   EKG Interpretation None      MDM   Final diagnoses:  Abdominal pain    Pt given potassium for her mild hypokalemia of 2.7. Given cipro and flagyl iv for diverticulitis--will send home on same along with pain meds, return precautions given    Leota Jacobsen, MD 02/13/15 [phone]

## 2015-03-01 LAB — COMPREHENSIVE METABOLIC PANEL
ALP, HEAT STABLE (LIVER): 39
ALT: 19
AST: 18 U/L
Anion gap: 8.6
BUN: 11
CALCIUM: 10.3 mg/dL
CHLORIDE: 102 mmol/L
CO2: 30 mmol/L
Creat: 0.57
EGFR (African American): 128
GFR CALC NON AF AMER: 106
GLUCOSE: 85
Potassium: 3.7 mmol/L
Sodium: 137

## 2015-04-19 ENCOUNTER — Other Ambulatory Visit (HOSPITAL_COMMUNITY): Payer: Self-pay | Admitting: Family Medicine

## 2015-04-19 DIAGNOSIS — Z1231 Encounter for screening mammogram for malignant neoplasm of breast: Secondary | ICD-10-CM

## 2015-04-27 ENCOUNTER — Ambulatory Visit (HOSPITAL_COMMUNITY)
Admission: RE | Admit: 2015-04-27 | Discharge: 2015-04-27 | Disposition: A | Discharge status: Home / Self Care | Payer: Medicare HMO | Source: Ambulatory Visit | Attending: Family Medicine | Admitting: Family Medicine

## 2015-04-27 DIAGNOSIS — Z1231 Encounter for screening mammogram for malignant neoplasm of breast: Secondary | ICD-10-CM

## 2015-05-19 ENCOUNTER — Ambulatory Visit (INDEPENDENT_AMBULATORY_CARE_PROVIDER_SITE_OTHER): Payer: Medicare HMO | Admitting: Nurse Practitioner

## 2015-05-19 ENCOUNTER — Encounter: Payer: Self-pay | Admitting: Nurse Practitioner

## 2015-05-19 VITALS — BP 127/85 | HR 64 | Temp 98.0°F | Ht 62.5 in | Wt 194.0 lb

## 2015-05-19 DIAGNOSIS — M5442 Lumbago with sciatica, left side: Secondary | ICD-10-CM | POA: Diagnosis not present

## 2015-05-19 DIAGNOSIS — Z1211 Encounter for screening for malignant neoplasm of colon: Secondary | ICD-10-CM | POA: Insufficient documentation

## 2015-05-19 DIAGNOSIS — R1013 Epigastric pain: Secondary | ICD-10-CM | POA: Diagnosis not present

## 2015-05-19 DIAGNOSIS — K5732 Diverticulitis of large intestine without perforation or abscess without bleeding: Secondary | ICD-10-CM | POA: Diagnosis not present

## 2015-05-19 DIAGNOSIS — K30 Functional dyspepsia: Secondary | ICD-10-CM | POA: Insufficient documentation

## 2015-05-19 DIAGNOSIS — M5410 Radiculopathy, site unspecified: Secondary | ICD-10-CM | POA: Insufficient documentation

## 2015-05-19 MED ORDER — PREDNISONE 10 MG PO TABS: ORAL_TABLET | ORAL | Status: DC

## 2015-05-19 NOTE — Patient Instructions (Addendum)
Decrease protonix to every other day.  Start probiotic. Take daily for 3 months.  Please see gastroenterology.   Please start prednisone. Take in morning as it can keep you awake. Potential side effects are moodiness, more energy, may feel jittery. Continue to walk & exercise as tolerated. See below for exercises. Hold positions for 10 seconds, repeat 5 times. Perform twice daily.  I will review Dr Drema Dallas records.  Please see me in 3 weeks to review digestion and back pain.  Back Exercises A sprain is an injury in which a ligament is torn. The ligaments of the lower back are vulnerable to sprains. However, they are strong and require great force to be injured. These ligaments are important for stabilizing the spinal column. Sprains are classified into three categories. Grade 1 sprains cause pain, but the tendon is not lengthened. Grade 2 sprains include a lengthened ligament, due to the ligament being stretched or partially ruptured. With grade 2 sprains there is still function, although the function may be decreased. Grade 3 sprains involve a complete tear of the tendon or muscle, and function is usually impaired. SYMPTOMS   Severe pain in the lower back.  Sometimes, a feeling of a "pop," "snap," or tear, at the time of injury.  Tenderness and sometimes swelling at the injury site.  Uncommonly, bruising (contusion) within 48 hours of injury.  Muscle spasms in the back. CAUSES  Low back sprains occur when a force is placed on the ligaments that is greater than they can handle. Common causes of injury include:  Performing a stressful act while off-balance.  Repetitive stressful activities that involve movement of the lower back.  Direct hit (trauma) to the lower back. RISK INCREASES WITH:  Contact sports (football, wrestling).  Collisions (major skiing accidents).  Sports that require throwing or lifting (baseball, weightlifting).  Sports involving twisting of the spine  (gymnastics, diving, tennis, golf).  Poor strength and flexibility.  Inadequate protection.  Previous back injury or surgery (especially fusion). PREVENTION  Wear properly fitted and padded protective equipment.  Warm up and stretch properly before activity.  Allow for adequate recovery between workouts.  Maintain physical fitness:  Strength, flexibility, and endurance.  Cardiovascular fitness.  Maintain a healthy body weight. PROGNOSIS  If treated properly, low back sprains usually heal with non-surgical treatment. The length of time for healing depends on the severity of the injury.  RELATED COMPLICATIONS   Recurring symptoms, resulting in a chronic problem.  Chronic inflammation and pain in the low back.  Delayed healing or resolution of symptoms, especially if activity is resumed too soon.  Prolonged impairment.  Unstable or arthritic joints of the low back. TREATMENT  Treatment first involves the use of ice and medicine, to reduce pain and inflammation. The use of strengthening and stretching exercises may help reduce pain with activity. These exercises may be performed at home or with a therapist. Severe injuries may require referral to a therapist for further evaluation and treatment, such as ultrasound. Your caregiver may advise that you wear a back brace or corset, to help reduce pain and discomfort. Often, prolonged bed rest results in greater harm then benefit. Corticosteroid injections may be recommended. However, these should be reserved for the most serious cases. It is important to avoid using your back when lifting objects. At night, sleep on your back on a firm mattress, with a pillow placed under your knees. If non-surgical treatment is unsuccessful, surgery may be needed.  MEDICATION   If pain medicine  is needed, nonsteroidal anti-inflammatory medicines (aspirin and ibuprofen), or other minor pain relievers (acetaminophen), are often advised.  Do not take  pain medicine for 7 days before surgery.  Prescription pain relievers may be given, if your caregiver thinks they are needed. Use only as directed and only as much as you need.  Ointments applied to the skin may be helpful.  Corticosteroid injections may be given by your caregiver. These injections should be reserved for the most serious cases, because they may only be given a certain number of times. HEAT AND COLD  Cold treatment (icing) should be applied for 10 to 15 minutes every 2 to 3 hours for inflammation and pain, and immediately after activity that aggravates your symptoms. Use ice packs or an ice massage.  Heat treatment may be used before performing stretching and strengthening activities prescribed by your caregiver, physical therapist, or athletic trainer. Use a heat pack or a warm water soak. SEEK MEDICAL CARE IF:   Symptoms get worse or do not improve in 2 to 4 weeks, despite treatment.  You develop numbness or weakness in either leg.  You lose bowel or bladder function.  Any of the following occur after surgery: fever, increased pain, swelling, redness, drainage of fluids, or bleeding in the affected area.  New, unexplained symptoms develop. (Drugs used in treatment may produce side effects.) EXERCISES  RANGE OF MOTION (ROM) AND STRETCHING EXERCISES - Low Back Sprain Most people with lower back pain will find that their symptoms get worse with excessive bending forward (flexion) or arching at the lower back (extension). The exercises that will help resolve your symptoms will focus on the opposite motion.  Your physician, physical therapist or athletic trainer will help you determine which exercises will be most helpful to resolve your lower back pain. Do not complete any exercises without first consulting with your caregiver. Discontinue any exercises which make your symptoms worse, until you speak to your caregiver. If you have pain, numbness or tingling which travels down  into your buttocks, leg or foot, the goal of the therapy is for these symptoms to move closer to your back and eventually resolve. Sometimes, these leg symptoms will get better, but your lower back pain may worsen. This is often an indication of progress in your rehabilitation. Be very alert to any changes in your symptoms and the activities in which you participated in the 24 hours prior to the change. Sharing this information with your caregiver will allow him or her to most efficiently treat your condition. These exercises may help you when beginning to rehabilitate your injury. Your symptoms may resolve with or without further involvement from your physician, physical therapist or athletic trainer. While completing these exercises, remember:   Restoring tissue flexibility helps normal motion to return to the joints. This allows healthier, less painful movement and activity.  An effective stretch should be held for at least 30 seconds.  A stretch should never be painful. You should only feel a gentle lengthening or release in the stretched tissue. FLEXION RANGE OF MOTION AND STRETCHING EXERCISES: STRETCH - Flexion, Single Knee to Chest   Lie on a firm bed or floor with both legs extended in front of you.  Keeping one leg in contact with the floor, bring your opposite knee to your chest. Hold your leg in place by either grabbing behind your thigh or at your knee.  Pull until you feel a gentle stretch in your low back. Hold __________ seconds.  Slowly release  your grasp and repeat the exercise with the opposite side. Repeat __________ times. Complete this exercise __________ times per day.  STRETCH - Flexion, Double Knee to Chest  Lie on a firm bed or floor with both legs extended in front of you.  Keeping one leg in contact with the floor, bring your opposite knee to your chest.  Tense your stomach muscles to support your back and then lift your other knee to your chest. Hold your legs in  place by either grabbing behind your thighs or at your knees.  Pull both knees toward your chest until you feel a gentle stretch in your low back. Hold __________ seconds.  Tense your stomach muscles and slowly return one leg at a time to the floor. Repeat __________ times. Complete this exercise __________ times per day.  STRETCH - Low Trunk Rotation  Lie on a firm bed or floor. Keeping your legs in front of you, bend your knees so they are both pointed toward the ceiling and your feet are flat on the floor.  Extend your arms out to the side. This will stabilize your upper body by keeping your shoulders in contact with the floor.  Gently and slowly drop both knees together to one side until you feel a gentle stretch in your low back. Hold for __________ seconds.  Tense your stomach muscles to support your lower back as you bring your knees back to the starting position. Repeat the exercise to the other side. Repeat __________ times. Complete this exercise __________ times per day  EXTENSION RANGE OF MOTION AND FLEXIBILITY EXERCISES: RANGE OF MOTION- Quadruped, Neutral Spine   Assume a hands and knees position on a firm surface. Keep your hands under your shoulders and your knees under your hips. You may place padding under your knees for comfort.  Drop your head and point your tailbone toward the ground below you. This will round out your lower back like an angry cat. Hold this position for __________ seconds.  Slowly lift your head and release your tail bone so that your back sags into a large arch, like an old horse.  Hold this position for __________ seconds.  Repeat this until you feel limber in your low back.  Now, find your "sweet spot." This will be the most comfortable position somewhere between the two previous positions. This is your neutral spine. Once you have found this position, tense your stomach muscles to support your low back.  Hold this position for __________  seconds. Repeat __________ times. Complete this exercise __________ times per day.  STRENGTHENING EXERCISES - Low Back Sprain These exercises may help you when beginning to rehabilitate your injury. These exercises should be done near your "sweet spot." This is the neutral, low-back arch, somewhere between fully rounded and fully arched, that is your least painful position. When performed in this safe range of motion, these exercises can be used for people who have either a flexion or extension based injury. These exercises may resolve your symptoms with or without further involvement from your physician, physical therapist or athletic trainer. While completing these exercises, remember:   Muscles can gain both the endurance and the strength needed for everyday activities through controlled exercises.  Complete these exercises as instructed by your physician, physical therapist or athletic trainer. Increase the resistance and repetitions only as guided.  You may experience muscle soreness or fatigue, but the pain or discomfort you are trying to eliminate should never worsen during these exercises. If this  pain does worsen, stop and make certain you are following the directions exactly. If the pain is still present after adjustments, discontinue the exercise until you can discuss the trouble with your caregiver. STRENGTHENING - Deep Abdominals, Pelvic Tilt   Lie on a firm bed or floor. Keeping your legs in front of you, bend your knees so they are both pointed toward the ceiling and your feet are flat on the floor.  Tense your lower abdominal muscles to press your low back into the floor. This motion will rotate your pelvis so that your tail bone is scooping upwards rather than pointing at your feet or into the floor. With a gentle tension and even breathing, hold this position for __________ seconds. Repeat __________ times. Complete this exercise __________ times per day.  STRENGTHENING -  Abdominals, Crunches   Lie on a firm bed or floor. Keeping your legs in front of you, bend your knees so they are both pointed toward the ceiling and your feet are flat on the floor. Cross your arms over your chest.  Slightly tip your chin down without bending your neck.  Tense your abdominals and slowly lift your trunk high enough to just clear your shoulder blades. Lifting higher can put excessive stress on the lower back and does not further strengthen your abdominal muscles.  Control your return to the starting position. Repeat __________ times. Complete this exercise __________ times per day.  STRENGTHENING - Quadruped, Opposite UE/LE Lift   Assume a hands and knees position on a firm surface. Keep your hands under your shoulders and your knees under your hips. You may place padding under your knees for comfort.  Find your neutral spine and gently tense your abdominal muscles so that you can maintain this position. Your shoulders and hips should form a rectangle that is parallel with the floor and is not twisted.  Keeping your trunk steady, lift your right hand no higher than your shoulder and then your left leg no higher than your hip. Make sure you are not holding your breath. Hold this position for __________ seconds.  Continuing to keep your abdominal muscles tense and your back steady, slowly return to your starting position. Repeat with the opposite arm and leg. Repeat __________ times. Complete this exercise __________ times per day.  STRENGTHENING - Abdominals and Quadriceps, Straight Leg Raise   Lie on a firm bed or floor with both legs extended in front of you.  Keeping one leg in contact with the floor, bend the other knee so that your foot can rest flat on the floor.  Find your neutral spine, and tense your abdominal muscles to maintain your spinal position throughout the exercise.  Slowly lift your straight leg off the floor about 6 inches for a count of 15, making sure  to not hold your breath.  Still keeping your neutral spine, slowly lower your leg all the way to the floor. Repeat this exercise with each leg __________ times. Complete this exercise __________ times per day. POSTURE AND BODY MECHANICS CONSIDERATIONS - Low Back Sprain Keeping correct posture when sitting, standing or completing your activities will reduce the stress put on different body tissues, allowing injured tissues a chance to heal and limiting painful experiences. The following are general guidelines for improved posture. Your physician or physical therapist will provide you with any instructions specific to your needs. While reading these guidelines, remember:  The exercises prescribed by your provider will help you have the flexibility and strength to  maintain correct postures.  The correct posture provides the best environment for your joints to work. All of your joints have less wear and tear when properly supported by a spine with good posture. This means you will experience a healthier, less painful body.  Correct posture must be practiced with all of your activities, especially prolonged sitting and standing. Correct posture is as important when doing repetitive low-stress activities (typing) as it is when doing a single heavy-load activity (lifting). RESTING POSITIONS Consider which positions are most painful for you when choosing a resting position. If you have pain with flexion-based activities (sitting, bending, stooping, squatting), choose a position that allows you to rest in a less flexed posture. You would want to avoid curling into a fetal position on your side. If your pain worsens with extension-based activities (prolonged standing, working overhead), avoid resting in an extended position such as sleeping on your stomach. Most people will find more comfort when they rest with their spine in a more neutral position, neither too rounded nor too arched. Lying on a non-sagging bed  on your side with a pillow between your knees, or on your back with a pillow under your knees will often provide some relief. Keep in mind, being in any one position for a prolonged period of time, no matter how correct your posture, can still lead to stiffness. PROPER SITTING POSTURE In order to minimize stress and discomfort on your spine, you must sit with correct posture. Sitting with good posture should be effortless for a healthy body. Returning to good posture is a gradual process. Many people can work toward this most comfortably by using various supports until they have the flexibility and strength to maintain this posture on their own. When sitting with proper posture, your ears will fall over your shoulders and your shoulders will fall over your hips. You should use the back of the chair to support your upper back. Your lower back will be in a neutral position, just slightly arched. You may place a small pillow or folded towel at the base of your lower back for  support.  When working at a desk, create an environment that supports good, upright posture. Without extra support, muscles tire, which leads to excessive strain on joints and other tissues. Keep these recommendations in mind: CHAIR:  A chair should be able to slide under your desk when your back makes contact with the back of the chair. This allows you to work closely.  The chair's height should allow your eyes to be level with the upper part of your monitor and your hands to be slightly lower than your elbows. BODY POSITION  Your feet should make contact with the floor. If this is not possible, use a foot rest.  Keep your ears over your shoulders. This will reduce stress on your neck and low back. INCORRECT SITTING POSTURES  If you are feeling tired and unable to assume a healthy sitting posture, do not slouch or slump. This puts excessive strain on your back tissues, causing more damage and pain. Healthier options  include:  Using more support, like a lumbar pillow.  Switching tasks to something that requires you to be upright or walking.  Talking a brief walk.  Lying down to rest in a neutral-spine position. PROLONGED STANDING WHILE SLIGHTLY LEANING FORWARD  When completing a task that requires you to lean forward while standing in one place for a long time, place either foot up on a stationary 2-4 inch high  object to help maintain the best posture. When both feet are on the ground, the lower back tends to lose its slight inward curve. If this curve flattens (or becomes too large), then the back and your other joints will experience too much stress, tire more quickly, and can cause pain. CORRECT STANDING POSTURES Proper standing posture should be assumed with all daily activities, even if they only take a few moments, like when brushing your teeth. As in sitting, your ears should fall over your shoulders and your shoulders should fall over your hips. You should keep a slight tension in your abdominal muscles to brace your spine. Your tailbone should point down to the ground, not behind your body, resulting in an over-extended swayback posture.  INCORRECT STANDING POSTURES  Common incorrect standing postures include a forward head, locked knees and/or an excessive swayback. WALKING Walk with an upright posture. Your ears, shoulders and hips should all line-up. PROLONGED ACTIVITY IN A FLEXED POSITION When completing a task that requires you to bend forward at your waist or lean over a low surface, try to find a way to stabilize 3 out of 4 of your limbs. You can place a hand or elbow on your thigh or rest a knee on the surface you are reaching across. This will provide you more stability, so that your muscles do not tire as quickly. By keeping your knees relaxed, or slightly bent, you will also reduce stress across your lower back. CORRECT LIFTING TECHNIQUES DO :  Assume a wide stance. This will provide you  more stability and the opportunity to get as close as possible to the object which you are lifting.  Tense your abdominals to brace your spine. Bend at the knees and hips. Keeping your back locked in a neutral-spine position, lift using your leg muscles. Lift with your legs, keeping your back straight.  Test the weight of unknown objects before attempting to lift them.  Try to keep your elbows locked down at your sides in order get the best strength from your shoulders when carrying an object.  Always ask for help when lifting heavy or awkward objects. INCORRECT LIFTING TECHNIQUES DO NOT:   Lock your knees when lifting, even if it is a small object.  Bend and twist. Pivot at your feet or move your feet when needing to change directions.  Assume that you can safely pick up even a paperclip without proper posture. Document Released: 12/10/2005 Document Revised: 03/03/2012 Document Reviewed: 03/24/2009 [facility] Patient Information 2015 [place], [place]. This information is not intended to replace advice given to you by your health care provider. Make sure you discuss any questions you have with your health care provider.

## 2015-05-19 NOTE — Progress Notes (Signed)
Subjective:     Shelly Leonard is a 69 y.o. female presents to establish care. Dr Leighton Ruff has been her PCP since 2013. She is no longer in her network.  Today she c/o Low l sided back pain & recent episode of diverticulitis. Low l sided back pain: onset-yrs ago. Recently, she fell when tripped in uneven ground (6wks ago). Back has been hurting more since fall. She stopped bowling for 2 weeks due to pain. She takes tylenol for pain w/relief. SHe went to PT for back pain many yrs ago w/relief. Pain radiates to L knee. Denies paresthesia & weakness.  Diverticulitis: episode in Jan & Feb 2016: went to ER, abd CT confirmed diverticulitis in colon & revealed periportal edema. Pt was treated w/ABX & had complete resolve of symptoms. Pt started high fiber diet, no recent LLQ pain, but c/o abd pain in multiple areas-comes & goes. Denies nausea, constipation, diarrhea, anorexia, wt changes, fever. Had colonoscopy 2005-no polyps.   She is taking protonix 40 mg qd for GERD. She says she has been on this med for yrs & has no symptoms. Discussed decreasing dose & adding probiotic. Prev care reviewed: Dexa 2015: osteopenia in radius, Pap 2015 nml, MMG 2016 nml.   The following portions of the patient's history were reviewed and updated as appropriate: allergies, current medications, past family history, past medical history, past social history, past surgical history and problem list.  Review of Systems Pertinent items are noted in HPI.    Objective:    BP 127/85 mmHg  Pulse 64  Temp(Src) 98 F (36.7 C) (Oral)  Ht 5' 2.5" (1.588 m)  Wt 194 lb (87.998 kg)  BMI 34.90 kg/m2  SpO2 97% BP 127/85 mmHg  Pulse 64  Temp(Src) 98 F (36.7 C) (Oral)  Ht 5' 2.5" (1.588 m)  Wt 194 lb (87.998 kg)  BMI 34.90 kg/m2  SpO2 97% General appearance: alert, cooperative, appears stated age and no distress Head: Normocephalic, without obvious abnormality, atraumatic Eyes: negative findings: lids and lashes  normal and conjunctivae and sclerae normal Back: Tender at L SI joint & over sacrum. pain w/L lateral flexion. no pain w/ R lateral flexion or forward flexion. pain w/ L sitting leg raise. No weakness.  Lungs: clear to auscultation bilaterally Heart: regular rate and rhythm, S1, S2 normal, no murmur, click, rub or gallop Abdomen: normal findings: no masses palpable and no organomegaly and abnormal findings:  LLQ tender Neurologic: Grossly normal    Assessment:Plan   1. Left-sided low back pain with left-sided sciatica - predniSONE (DELTASONE) 10 MG tablet; Take 3Tpo qam X 4d, then 2T po qam X 4d, then 1T po qd X 4d.  Dispense: 24 tablet; Refill: 0 Daily back stretches as discussed If improvement at f/u ref to PT  2. Indigestion No current symptoms.  Decrease protonix 40 mg to qod  3. Diverticulitis of colon Last episode 3 mos ago - Ambulatory referral to Gastroenterology Continue high fiber diet & start daily probiotic  4. Colon cancer screening Last colo 2005 - Ambulatory referral to Gastroenterology  F/u 3 weeks-back pain, med changes-decrease protonix

## 2015-05-19 NOTE — Progress Notes (Signed)
Pre visit review using our clinic review tool, if applicable. No additional management support is needed unless otherwise documented below in the visit note. 

## 2015-05-26 ENCOUNTER — Other Ambulatory Visit: Payer: Self-pay

## 2015-05-26 ENCOUNTER — Other Ambulatory Visit: Payer: Self-pay | Admitting: Nurse Practitioner

## 2015-05-26 DIAGNOSIS — E876 Hypokalemia: Secondary | ICD-10-CM

## 2015-05-26 DIAGNOSIS — I10 Essential (primary) hypertension: Secondary | ICD-10-CM

## 2015-05-26 NOTE — Telephone Encounter (Signed)
Will wait for lab results tomorrow

## 2015-05-26 NOTE — Telephone Encounter (Signed)
Ask if she has enough to last until appt this mo. If not, can she come in for labs before her appt? I want to check her potassium -it was low at ED encounter in Feb- her BP med can cause this.

## 2015-05-26 NOTE — Telephone Encounter (Signed)
Please Advise Refill Request? Refill request for- Maxzide 75-50mg  Last filled by MD on - never Last Appt - 05/19/15        Next Appt - 06/09/15 Pharmacy- [place]

## 2015-05-26 NOTE — Telephone Encounter (Signed)
Patient does not have enough to last her until appointment. Only has enough to last until Sunday. Lab Appt made for tomorrow.

## 2015-05-27 ENCOUNTER — Other Ambulatory Visit (INDEPENDENT_AMBULATORY_CARE_PROVIDER_SITE_OTHER): Payer: Medicare HMO

## 2015-05-27 DIAGNOSIS — I10 Essential (primary) hypertension: Secondary | ICD-10-CM

## 2015-05-27 DIAGNOSIS — E876 Hypokalemia: Secondary | ICD-10-CM | POA: Diagnosis not present

## 2015-05-27 LAB — COMPREHENSIVE METABOLIC PANEL
ALT: 15 U/L (ref 0–35)
AST: 12 U/L (ref 0–37)
Albumin: 3.7 g/dL (ref 3.5–5.2)
Alkaline Phosphatase: 47 U/L (ref 39–117)
BUN: 15 mg/dL (ref 6–23)
CO2: 33 mEq/L — ABNORMAL HIGH (ref 19–32)
CREATININE: 0.68 mg/dL (ref 0.40–1.20)
Calcium: 10.5 mg/dL (ref 8.4–10.5)
Chloride: 99 mEq/L (ref 96–112)
GFR: 110.79 mL/min (ref >60.00)
Glucose, Bld: 90 mg/dL (ref 70–99)
Potassium: 3.8 mEq/L (ref 3.5–5.1)
SODIUM: 135 meq/L (ref 135–145)
TOTAL PROTEIN: 7.4 g/dL (ref 6.0–8.3)
Total Bilirubin: 0.5 mg/dL (ref 0.2–1.2)

## 2015-05-27 LAB — MICROALBUMIN / CREATININE URINE RATIO
CREATININE, U: 113 mg/dL
Microalb Creat Ratio: 0.8 mg/g (ref 0.0–30.0)
Microalb, Ur: 0.9 mg/dL (ref 0.0–1.9)

## 2015-05-29 ENCOUNTER — Telehealth: Payer: Self-pay | Admitting: Nurse Practitioner

## 2015-05-29 DIAGNOSIS — I10 Essential (primary) hypertension: Secondary | ICD-10-CM

## 2015-05-29 MED ORDER — TRIAMTERENE-HCTZ 75-50 MG PO TABS: 1.0000 | ORAL_TABLET | Freq: Every day | ORAL | Status: DC

## 2015-05-29 NOTE — Telephone Encounter (Signed)
K+ nml Notified pt maxzide sent to pharm

## 2015-06-09 ENCOUNTER — Ambulatory Visit (INDEPENDENT_AMBULATORY_CARE_PROVIDER_SITE_OTHER): Payer: Medicare HMO | Admitting: Nurse Practitioner

## 2015-06-09 ENCOUNTER — Encounter: Payer: Self-pay | Admitting: Nurse Practitioner

## 2015-06-09 ENCOUNTER — Telehealth: Payer: Self-pay | Admitting: Nurse Practitioner

## 2015-06-09 VITALS — BP 120/73 | HR 70 | Temp 98.1°F | Resp 18 | Ht 62.5 in | Wt 192.0 lb

## 2015-06-09 DIAGNOSIS — E7800 Pure hypercholesterolemia, unspecified: Secondary | ICD-10-CM

## 2015-06-09 DIAGNOSIS — K30 Functional dyspepsia: Secondary | ICD-10-CM

## 2015-06-09 DIAGNOSIS — B372 Candidiasis of skin and nail: Secondary | ICD-10-CM

## 2015-06-09 DIAGNOSIS — R1013 Epigastric pain: Secondary | ICD-10-CM | POA: Diagnosis not present

## 2015-06-09 DIAGNOSIS — M5442 Lumbago with sciatica, left side: Secondary | ICD-10-CM

## 2015-06-09 DIAGNOSIS — K5732 Diverticulitis of large intestine without perforation or abscess without bleeding: Secondary | ICD-10-CM

## 2015-06-09 DIAGNOSIS — E559 Vitamin D deficiency, unspecified: Secondary | ICD-10-CM

## 2015-06-09 DIAGNOSIS — D509 Iron deficiency anemia, unspecified: Secondary | ICD-10-CM

## 2015-06-09 MED ORDER — TRIAMCINOLONE ACETONIDE 0.1 % EX CREA: 1.0000 "application " | TOPICAL_CREAM | Freq: Two times a day (BID) | CUTANEOUS | Status: DC

## 2015-06-09 NOTE — Assessment & Plan Note (Signed)
Continue probiotic Avoid food triggers Take omeprazole PRN

## 2015-06-09 NOTE — Patient Instructions (Addendum)
Great job with weight loss! Keep up the good work!  Continue daily back stretches. Continue probiotic.  Apply steroid cream twice daily to rash under arm. Also use vinegar wash twice daily. Do not use powders with talcum.  Pls return in October to recheck labs & blood pressure.

## 2015-06-09 NOTE — Telephone Encounter (Signed)
Left detailed message on pt's phone.  Okay per DPR. 

## 2015-06-09 NOTE — Assessment & Plan Note (Signed)
Offered PT Pt wants to continue home stretches & exercises

## 2015-06-09 NOTE — Progress Notes (Signed)
Subjective:    Shelly Leonard is a 69 y.o. female who presents for follow up of low back problems, indigestion, & new rash.  Back: Current symptoms include: l lumbar pain with no radiation. Symptoms have significantly improved from the previous visit. She completed 2 wk pred taper and is stretching & exercising daily. Exacerbating factors identified by the patient are bending sideways and sitting. Indigestion: symptoms have decreased in frequency since starting probiotic & changing eating habits. She has lost 2 lbs. She decreased omeprazole to PRN & only needed twice in last 3 weeks. Rash: recurrent, onset-several mos ago. L axillary. Used clotrimazole & steroid cream in past w/good results. Keeps powder on rash. Not itchy or painful.  The following portions of the patient's history were reviewed and updated as appropriate: allergies, current medications, past medical history, past social history, past surgical history and problem list.    Objective:    BP 120/73 mmHg  Pulse 70  Temp(Src) 98.1 F (36.7 C) (Temporal)  Resp 18  Ht 5' 2.5" (1.588 m)  Wt 192 lb (87.091 kg)  BMI 34.54 kg/m2  SpO2 97% General appearance: alert, cooperative, appears stated age and no distress Eyes: negative findings: lids and lashes normal and conjunctivae and sclerae normal Back: range of motion normal, tender at L SIJ.   Skin: erythematous patch L axillary, few satellite dots in periphery.No scale.  Neurologic: Grossly normal    Assessment:plan     1. Cutaneous candidiasis - clotrimazole (LOTRIMIN) 1 % cream; Apply 1 application topically 2 (two) times daily.  Dispense: 28 g; Refill: 0 Vinegar wash bid Keep dry No powders with talcum or starch  2. Left-sided low back pain with left-sided sciatica Continue daily stretches  3. Indigestion Continue omeprazole PRN Continue probiotic qd  F/u 4 mos- labs for chronic conditions, HTN

## 2015-06-09 NOTE — Progress Notes (Signed)
Pre visit review using our clinic review tool, if applicable. No additional management support is needed unless otherwise documented below in the visit note. 

## 2015-06-09 NOTE — Telephone Encounter (Signed)
pls call pt: Advise Not to use powders with talcum under arm. (I forgot to tell her).

## 2015-06-10 MED ORDER — CLOTRIMAZOLE 1 % EX CREA: 1.0000 "application " | TOPICAL_CREAM | Freq: Two times a day (BID) | CUTANEOUS | Status: DC

## 2015-06-14 NOTE — Telephone Encounter (Signed)
Pt advised and voiced understanding. She stated that she will call back to schedule lab visit.

## 2015-06-14 NOTE — Telephone Encounter (Signed)
pls call pt: Advise After reviewing Dr Drema Dallas notes, she needs vit D level, lipids, & iron panel repeated. pls sched lab appt only. I could not locate vaccine info in old record: she may need tetanus & pneumococcal 13 vaccines. OK to give.

## 2015-06-14 NOTE — Telephone Encounter (Signed)
Left detailed message on pt's cell.  Okay per DPR. 

## 2015-06-15 ENCOUNTER — Encounter: Payer: Self-pay | Admitting: Nurse Practitioner

## 2015-06-15 LAB — CBC
BASO: 1 %
Basophils Absolute: 0 /uL
EOS%: 4 %
Eosinophil #: 0.2
HCT: 37.9
HEMOGLOBIN: 12 g/dL
LYMPHS PCT: 35.7
MCH: 26.8
MCHC: 31.6
MCV: 84.7
MONO#: 0.6
MONO: 9 /uL
MPV: 8.1 fL (ref 7.8–11)
NEUT%: 51 %
NEUTROS ABS: 3
NRBC: 0.1
Platelet: 335
RBC: 4.47
RDW: 16.3
WBC: 6
lymph#: 2.1
nRBC#: 0.01

## 2015-07-07 ENCOUNTER — Other Ambulatory Visit: Payer: Self-pay | Admitting: Nurse Practitioner

## 2015-07-07 MED ORDER — VERAPAMIL HCL 120 MG PO TABS: 120.0000 mg | ORAL_TABLET | Freq: Once | ORAL | Status: DC

## 2015-07-07 NOTE — Telephone Encounter (Signed)
Left detailed message on pt's phone.  OKay per DPR.

## 2015-07-07 NOTE — Telephone Encounter (Signed)
pls call pt: Advise i recommend she reduce verapamil to 1T qd rather than 2T qd. She may schedule appt if she has concerns or if her BP is running over 150/90. New script sent.

## 2015-07-07 NOTE — Telephone Encounter (Signed)
Please advise 

## 2015-07-07 NOTE — Telephone Encounter (Signed)
Patient requests 90 day supply Verapamil 120MG  Tab [facility]  [address], [place]

## 2015-07-12 ENCOUNTER — Other Ambulatory Visit (INDEPENDENT_AMBULATORY_CARE_PROVIDER_SITE_OTHER): Payer: Medicare HMO

## 2015-07-12 DIAGNOSIS — E559 Vitamin D deficiency, unspecified: Secondary | ICD-10-CM

## 2015-07-12 DIAGNOSIS — E7800 Pure hypercholesterolemia, unspecified: Secondary | ICD-10-CM

## 2015-07-12 DIAGNOSIS — D509 Iron deficiency anemia, unspecified: Secondary | ICD-10-CM

## 2015-07-12 LAB — LIPID PANEL
Cholesterol: 162 mg/dL (ref 0–200)
HDL: 55.2 mg/dL (ref >39.00)
LDL Cholesterol: 88 mg/dL (ref 0–99)
NonHDL: 106.8
Total CHOL/HDL Ratio: 3
Triglycerides: 96 mg/dL (ref 0.0–149.0)
VLDL: 19.2 mg/dL (ref 0.0–40.0)

## 2015-07-12 LAB — IRON AND TIBC
%SAT: 30 % (ref 20–55)
Iron: 97 ug/dL (ref 42–145)
TIBC: 325 ug/dL (ref 250–470)
UIBC: 228 ug/dL (ref 125–400)

## 2015-07-12 LAB — VITAMIN D 25 HYDROXY (VIT D DEFICIENCY, FRACTURES): VITD: 26.54 ng/mL — ABNORMAL LOW (ref 30.00–100.00)

## 2015-07-13 ENCOUNTER — Telehealth: Payer: Self-pay | Admitting: Nurse Practitioner

## 2015-07-13 DIAGNOSIS — E559 Vitamin D deficiency, unspecified: Secondary | ICD-10-CM

## 2015-07-13 MED ORDER — VITAMIN D3 1.25 MG (50000 UT) PO CAPS: 1.0000 | ORAL_CAPSULE | ORAL | Status: DC

## 2015-07-13 NOTE — Telephone Encounter (Signed)
pls call pt: Advise Vit D still low. Pls start prescription strength as directed. Have level checked in 3 mos. All other labs look great![name] Kitchen

## 2015-07-28 ENCOUNTER — Ambulatory Visit (INDEPENDENT_AMBULATORY_CARE_PROVIDER_SITE_OTHER): Payer: Medicare HMO | Admitting: Family Medicine

## 2015-07-28 VITALS — BP 145/80 | HR 77 | Temp 98.2°F | Resp 18 | Ht 62.5 in | Wt 194.0 lb

## 2015-07-28 DIAGNOSIS — I10 Essential (primary) hypertension: Secondary | ICD-10-CM | POA: Insufficient documentation

## 2015-07-28 DIAGNOSIS — B372 Candidiasis of skin and nail: Secondary | ICD-10-CM

## 2015-07-28 MED ORDER — BLOOD PRESSURE MONITOR/L CUFF MISC: Status: DC

## 2015-07-28 NOTE — Patient Instructions (Signed)
It was a pleasure to meet you.  We will follow up with you beginning of October.  Please monitor your BP at home 2-3 weeks. If your BP is elevated above 140/90 consistently then I will need to see you to re-evaluate.

## 2015-07-28 NOTE — Progress Notes (Signed)
Pre visit review using our clinic review tool, if applicable. No additional management support is needed unless otherwise documented below in the visit note. 

## 2015-07-29 ENCOUNTER — Encounter: Payer: Self-pay | Admitting: Family Medicine

## 2015-07-29 NOTE — Assessment & Plan Note (Signed)
Shelly Leonard is a 69 y.o. African-American female presented in office today for concerns for reoccurring cutaneous candidiasis under her left axilla. Patient was blood pressure was elevated on exam. - Candidiasis of the skin: Discussed with patient today does not appear that she currently has an infection today. She denies any current irritation, and there is no findings on exam outside of hyperpigmentation. Scheduled with patient that the hyperpigmentation will likely remain from her chronic infection underneath her axilla. - Discuss only use and clotrimazole when she feels that is irritated or notices redness. Continue vinegar washes as desired. Continue new deodorant, caution with shaving as this may increase chances of repeat infection. Follow-up as needed.

## 2015-07-29 NOTE — Progress Notes (Signed)
Subjective:    Patient ID: Shelly Leonard, female    DOB: 10/18/1946, 69 y.o.   MRN: [id number]  HPI  Rash: Patient presents to acute office visit for rash under her left axillary region. Patient states that she was seen here a few weeks ago, given instructions to complete vinegar washes 2 times a day, keep the area dry, do not use talc powder. She was also prescribed clotrimazole 1% cream. She states yesterday she felt that it was still red underneath her arm, but today the redness has resolved. She states that she has been a new deodorant, without aluminum and feels that is helpful as well. She reports her skin is still darker underneath the arm.  Hypertension: Patient states that when she was here last in June she was instructed to take half doses of her blood pressure medications. She does not check her blood pressure at home. She denies any chest pain, shortness of breath, dizziness, headache or visual changes. She reports she did not take her blood pressure medication this morning, and just took the medication approximately half an hour prior to her appointment.  Never smoker  Past Medical History  Diagnosis Date  . PONV (postoperative nausea and vomiting)   . Hypertension   . Anemia     low iron per pt   . GERD (gastroesophageal reflux disease)   . Headache(784.0)     occasional   . Arthritis     knees and hands    No Known Allergies Past Surgical History  Procedure Laterality Date  . Knee arthroscopy      right knee 2012   . Carpal tunnel release      left   . Other surgical history      spur removed top of left foot   . Breast reduction surgery  1982  . Total knee arthroplasty  04/21/2012    Procedure: TOTAL KNEE ARTHROPLASTY;  Surgeon: [name], MD;  Location: [facility];  Service: Orthopedics;  Laterality: Right;  . Vaginal hysterectomy  1977   No Known Allergies  History   Social History  . Marital Status: Married    Spouse Name: N/A  . Number of  Children: 2  . Years of Education: N/A   Occupational History  . Not on file.   Social History Main Topics  . Smoking status: Never Smoker   . Smokeless tobacco: Never Used  . Alcohol Use: No  . Drug Use: No  . Sexual Activity: Yes    Birth Control/ Protection: Post-menopausal   Other Topics Concern  . Not on file   Social History Narrative   [name] lives with husband. She is musician-plays at churches.   Review of Systems  Constitutional: Negative for diaphoresis and activity change.  Respiratory: Negative for chest tightness and shortness of breath.   Cardiovascular: Negative for chest pain, palpitations and leg swelling.  Musculoskeletal: Positive for arthralgias.  Skin: Positive for color change and rash.  Neurological: Negative for dizziness, syncope, numbness and headaches.      Objective:   Physical Exam BP 145/80 mmHg  Pulse 77  Temp(Src) 98.2 F (36.8 C) (Temporal)  Resp 18  Ht 5' 2.5" (1.588 m)  Wt 194 lb (87.998 kg)  BMI 34.90 kg/m2  SpO2 97% First BP 148/102 Gen: NAD. Nontoxic in appearance, well-developed, well-nourished, obese African-American female, very pleasant. HEENT: AT. [place].Bilateral eyes without injections, drainage or icterus. MMM.  CV: RRR   Skin: No rashes, purpura or  petechiae. Hyperpigmentation left axilla.    Assessment & Plan:  Shelly Leonard is a 70 y.o. African-American female presented in office today for concerns for reoccurring cutaneous candidiasis under her left axilla. Patient was blood pressure was elevated on exam. - Candidiasis of the skin: Discussed with patient today does not appear that she currently has an infection today. She denies any current irritation, and there is no findings on exam outside of hyperpigmentation. Scheduled with patient that the hyperpigmentation will likely remain from her chronic infection underneath her axilla. - Discuss only use and clotrimazole when she feels that is irritated or notices  redness. Continue vinegar washes as desired. Continue new deodorant, caution with shaving as this may increase chances of repeat infection. Follow-up as needed.  - Hypertension: Discussed normal blood pressure with the patient today, and prescribed a blood pressure cuff. I have asked her to take her blood pressure 3 times a week, at all different times of the day and write this down for a follow-up appointment at the beginning of October. She was advised if her blood pressure was above 140/90 consistently then I would need to see her sooner. Patient may need to get back on her prior dose of medication, her blood pressure at that time was normal and she was asymptomatic.

## 2015-07-29 NOTE — Assessment & Plan Note (Signed)
-   Hypertension: Discussed normal blood pressure with the patient today, and prescribed a blood pressure cuff. I have asked her to take her blood pressure 3 times a week, at all different times of the day and write this down for a follow-up appointment at the beginning of October. She was advised if her blood pressure was above 140/90 consistently then I would need to see her sooner. Patient may need to get back on her prior dose of medication, her blood pressure at that time was normal and she was asymptomatic.

## 2015-08-23 ENCOUNTER — Encounter: Payer: Self-pay | Admitting: Family Medicine

## 2015-08-23 DIAGNOSIS — D509 Iron deficiency anemia, unspecified: Secondary | ICD-10-CM | POA: Insufficient documentation

## 2015-10-07 ENCOUNTER — Telehealth: Payer: Self-pay | Admitting: Family Medicine

## 2015-10-07 MED ORDER — VERAPAMIL HCL 120 MG PO TABS: 120.0000 mg | ORAL_TABLET | Freq: Once | ORAL | Status: DC

## 2015-10-07 NOTE — Telephone Encounter (Signed)
Pt called and needs a refill on her Verapamil (90 day suly) Pharmacy will not fill it because the original was written by Layne. She has an appt Oct 18 @ 2:00pm for f/u meds

## 2015-10-07 NOTE — Telephone Encounter (Signed)
Rx sent to [facility] pharmacy.  

## 2015-10-11 ENCOUNTER — Telehealth: Payer: Self-pay | Admitting: Nurse Practitioner

## 2015-10-11 ENCOUNTER — Encounter: Payer: Self-pay | Admitting: Family Medicine

## 2015-10-11 ENCOUNTER — Ambulatory Visit (INDEPENDENT_AMBULATORY_CARE_PROVIDER_SITE_OTHER): Payer: Medicare HMO | Admitting: Family Medicine

## 2015-10-11 VITALS — BP 127/82 | HR 74 | Temp 97.9°F | Resp 20 | Ht 63.0 in | Wt 194.8 lb

## 2015-10-11 DIAGNOSIS — Z1159 Encounter for screening for other viral diseases: Secondary | ICD-10-CM

## 2015-10-11 DIAGNOSIS — K5732 Diverticulitis of large intestine without perforation or abscess without bleeding: Secondary | ICD-10-CM | POA: Diagnosis not present

## 2015-10-11 DIAGNOSIS — E559 Vitamin D deficiency, unspecified: Secondary | ICD-10-CM | POA: Diagnosis not present

## 2015-10-11 NOTE — Telephone Encounter (Signed)
Left message for patient to return my call.

## 2015-10-11 NOTE — Progress Notes (Signed)
Subjective:    Patient ID: Shelly Leonard, female    DOB: February 14, 1946, 69 y.o.   MRN: [id number]  HPI  Vitamin D deficiency: Patient with consistently low vitamin D, last vitamin D 26.5. Patient has been supplementing with 50,000 units weekly, completing her supplementation last week. She continues to take 1000 g vitamin D daily. DEXA in 2015 with osteopenia.  Need for hepatitis C screening test: Indicated  Diverticulitis of colon: Patient in need of colonoscopy, and with diverticulitis. No current complications, has had referral for lobe our common is ready to schedule today.  Hypertension: She reports compliance with her verapamil 120 mg daily and her Maxzide 75-50 milligrams daily. She states that she does not routinely check her blood pressures in the outpatient setting. She has been exercising a great deal, she bowls 3 times a week, and also the member of [employer] where she exercises at least 3 additional times a week. She continues to watch her diet, eating low sugar, low salt. She denies any chest pain, shortness of breath, visual changes, lower extremity edema or orthopnea.  Past Medical History  Diagnosis Date  . PONV (postoperative nausea and vomiting)   . Hypertension   . Anemia     low iron (hgb 8)  . GERD (gastroesophageal reflux disease)   . Headache(784.0)     occasional   . Arthritis     knees and hands   . Vertigo   . Vitamin D deficiency   . Osteopenia   . Carpal tunnel syndrome   . Diverticulitis large intestine w/o perforation or abscess w/bleeding    No Known Allergies Social History   Social History  . Marital Status: Married    Spouse Name: N/A  . Number of Children: 2  . Years of Education: N/A   Occupational History  . Not on file.   Social History Main Topics  . Smoking status: Never Smoker   . Smokeless tobacco: Never Used  . Alcohol Use: No  . Drug Use: No  . Sexual Activity: Yes    Birth Control/ Protection: Post-menopausal    Other Topics Concern  . Not on file   Social History Narrative   Ms Cake lives with husband. She is musician-plays at churches.    Review of Systems Negative, with the exception of above mentioned in HPI     Objective:   Physical Exam Blood pressure 127/82, pulse 74, temperature 97.9 F (36.6 C), temperature source Oral, resp. rate 20, height 5\' 3"  (1.6 m), weight 194 lb 12.8 oz (88.361 kg), SpO2 98 %. Gen: Afebrile. No acute distress. Nontoxic in appearance, well-developed, well-nourished, American female, very pleasant. HENT: AT. [place].  MMM.  Eyes:Pupils Equal Round Reactive to light, Extraocular movements intact,  Conjunctiva without redness, discharge or icterus. CV: RRR no murmurs appreciated, no edema, +2/4 P posterior tibialis pulses Chest: CTAB, no wheeze or crackles Abd: Soft. Obese. NTND. BS present. No Masses palpated.     Assessment & Plan:  1. Vitamin D deficiency - Recheck vitamin D today, if normal values, would continue the daily supplementation of 1000 international units daily. - Vitamin D (25 hydroxy) - DEXA scan will need to be repeated in 2017 if unable to get vitamin D to appropriate levels, if not would consider waiting until 2018.  2. Need for hepatitis C screening test - Hepatitis C Antibody  3. Diverticulitis of colon - Patient states that she is finally able to schedule her appointment to have her  colonoscopy completed, she states that she is setting this up today.  4. Hypertension - Continue current regimen of verapamil 120 mg daily and Maxzide 75-50 daily - Patient to monitor her blood pressure in the outpatient setting, it consistently seeing levels above 140/90 she will need to make appointment sooner to be evaluated, otherwise we'll see her in 3-4 months for hypertension. - Continue to eat a low-salt diet, and exercise at least 150 minutes a week.  Follow-up in 3-4 months for hypertension, preventative visit at the end of April.

## 2015-10-11 NOTE — Patient Instructions (Signed)
Health Maintenance, Female Adopting a healthy lifestyle and getting preventive care can go a long way to promote health and wellness. Talk with your health care provider about what schedule of regular examinations is right for you. This is a good chance for you to check in with your provider about disease prevention and staying healthy. In between checkups, there are plenty of things you can do on your own. Experts have done a lot of research about which lifestyle changes and preventive measures are most likely to keep you healthy. Ask your health care provider for more information. WEIGHT AND DIET  Eat a healthy diet  Be sure to include plenty of vegetables, fruits, low-fat dairy products, and lean protein.  Do not eat a lot of foods high in solid fats, added sugars, or salt.  Get regular exercise. This is one of the most important things you can do for your health.  Most adults should exercise for at least 150 minutes each week. The exercise should increase your heart rate and make you sweat (moderate-intensity exercise).  Most adults should also do strengthening exercises at least twice a week. This is in addition to the moderate-intensity exercise.  Maintain a healthy weight  Body mass index (BMI) is a measurement that can be used to identify possible weight problems. It estimates body fat based on height and weight. Your health care provider can help determine your BMI and help you achieve or maintain a healthy weight.  For females 20 years of age and older:   A BMI below 18.5 is considered underweight.  A BMI of 18.5 to 24.9 is normal.  A BMI of 25 to 29.9 is considered overweight.  A BMI of 30 and above is considered obese.  Watch levels of cholesterol and blood lipids  You should start having your blood tested for lipids and cholesterol at 68 years of age, then have this test every 5 years.  You may need to have your cholesterol levels checked more often if:  Your lipid  or cholesterol levels are high.  You are older than 68 years of age.  You are at high risk for heart disease.  CANCER SCREENING   Lung Cancer  Lung cancer screening is recommended for adults 55-80 years old who are at high risk for lung cancer because of a history of smoking.  A yearly low-dose CT scan of the lungs is recommended for people who:  Currently smoke.  Have quit within the past 15 years.  Have at least a 30-pack-year history of smoking. A pack year is smoking an average of one pack of cigarettes a day for 1 year.  Yearly screening should continue until it has been 15 years since you quit.  Yearly screening should stop if you develop a health problem that would prevent you from having lung cancer treatment.  Breast Cancer  Practice breast self-awareness. This means understanding how your breasts normally appear and feel.  It also means doing regular breast self-exams. Let your health care provider know about any changes, no matter how small.  If you are in your 20s or 30s, you should have a clinical breast exam (CBE) by a health care provider every 1-3 years as part of a regular health exam.  If you are 40 or older, have a CBE every year. Also consider having a breast X-ray (mammogram) every year.  If you have a family history of breast cancer, talk to your health care provider about genetic screening.  If you   are at high risk for breast cancer, talk to your health care provider about having an MRI and a mammogram every year.  Breast cancer gene (BRCA) assessment is recommended for women who have family members with BRCA-related cancers. BRCA-related cancers include:  Breast.  Ovarian.  Tubal.  Peritoneal cancers.  Results of the assessment will determine the need for genetic counseling and BRCA1 and BRCA2 testing. Cervical Cancer Your health care provider may recommend that you be screened regularly for cancer of the pelvic organs (ovaries, uterus, and  vagina). This screening involves a pelvic examination, including checking for microscopic changes to the surface of your cervix (Pap test). You may be encouraged to have this screening done every 3 years, beginning at age 21.  For women ages 30-65, health care providers may recommend pelvic exams and Pap testing every 3 years, or they may recommend the Pap and pelvic exam, combined with testing for human papilloma virus (HPV), every 5 years. Some types of HPV increase your risk of cervical cancer. Testing for HPV may also be done on women of any age with unclear Pap test results.  Other health care providers may not recommend any screening for nonpregnant women who are considered low risk for pelvic cancer and who do not have symptoms. Ask your health care provider if a screening pelvic exam is right for you.  If you have had past treatment for cervical cancer or a condition that could lead to cancer, you need Pap tests and screening for cancer for at least 20 years after your treatment. If Pap tests have been discontinued, your risk factors (such as having a new sexual partner) need to be reassessed to determine if screening should resume. Some women have medical problems that increase the chance of getting cervical cancer. In these cases, your health care provider may recommend more frequent screening and Pap tests. Colorectal Cancer  This type of cancer can be detected and often prevented.  Routine colorectal cancer screening usually begins at 68 years of age and continues through 68 years of age.  Your health care provider may recommend screening at an earlier age if you have risk factors for colon cancer.  Your health care provider may also recommend using home test kits to check for hidden blood in the stool.  A small camera at the end of a tube can be used to examine your colon directly (sigmoidoscopy or colonoscopy). This is done to check for the earliest forms of colorectal  cancer.  Routine screening usually begins at age 50.  Direct examination of the colon should be repeated every 5-10 years through 68 years of age. However, you may need to be screened more often if early forms of precancerous polyps or small growths are found. Skin Cancer  Check your skin from head to toe regularly.  Tell your health care provider about any new moles or changes in moles, especially if there is a change in a mole's shape or color.  Also tell your health care provider if you have a mole that is larger than the size of a pencil eraser.  Always use sunscreen. Apply sunscreen liberally and repeatedly throughout the day.  Protect yourself by wearing long sleeves, pants, a wide-brimmed hat, and sunglasses whenever you are outside. HEART DISEASE, DIABETES, AND HIGH BLOOD PRESSURE   High blood pressure causes heart disease and increases the risk of stroke. High blood pressure is more likely to develop in:  People who have blood pressure in the high end   of the normal range (130-139/85-89 mm Hg).  People who are overweight or obese.  People who are African American.  If you are 38-23 years of age, have your blood pressure checked every 3-5 years. If you are 61 years of age or older, have your blood pressure checked every year. You should have your blood pressure measured twice--once when you are at a hospital or clinic, and once when you are not at a hospital or clinic. Record the average of the two measurements. To check your blood pressure when you are not at a hospital or clinic, you can use:  An automated blood pressure machine at a pharmacy.  A home blood pressure monitor.  If you are between 45 years and 39 years old, ask your health care provider if you should take aspirin to prevent strokes.  Have regular diabetes screenings. This involves taking a blood sample to check your fasting blood sugar level.  If you are at a normal weight and have a low risk for diabetes,  have this test once every three years after 68 years of age.  If you are overweight and have a high risk for diabetes, consider being tested at a younger age or more often. PREVENTING INFECTION  Hepatitis B  If you have a higher risk for hepatitis B, you should be screened for this virus. You are considered at high risk for hepatitis B if:  You were born in a country where hepatitis B is common. Ask your health care provider which countries are considered high risk.  Your parents were born in a high-risk country, and you have not been immunized against hepatitis B (hepatitis B vaccine).  You have HIV or AIDS.  You use needles to inject street drugs.  You live with someone who has hepatitis B.  You have had sex with someone who has hepatitis B.  You get hemodialysis treatment.  You take certain medicines for conditions, including cancer, organ transplantation, and autoimmune conditions. Hepatitis C  Blood testing is recommended for:  Everyone born from [birth year] through 1964.  Anyone with known risk factors for hepatitis C. Sexually transmitted infections (STIs)  You should be screened for sexually transmitted infections (STIs) including gonorrhea and chlamydia if:  You are sexually active and are younger than 68 years of age.  You are older than 68 years of age and your health care provider tells you that you are at risk for this type of infection.  Your sexual activity has changed since you were last screened and you are at an increased risk for chlamydia or gonorrhea. Ask your health care provider if you are at risk.  If you do not have HIV, but are at risk, it may be recommended that you take a prescription medicine daily to prevent HIV infection. This is called pre-exposure prophylaxis (PrEP). You are considered at risk if:  You are sexually active and do not regularly use condoms or know the HIV status of your partner(s).  You take drugs by injection.  You are sexually  active with a partner who has HIV. Talk with your health care provider about whether you are at high risk of being infected with HIV. If you choose to begin PrEP, you should first be tested for HIV. You should then be tested every 3 months for as long as you are taking PrEP.  PREGNANCY   If you are premenopausal and you may become pregnant, ask your health care provider about preconception counseling.  If you may  become pregnant, take 400 to 800 micrograms (mcg) of folic acid every day.  If you want to prevent pregnancy, talk to your health care provider about birth control (contraception). OSTEOPOROSIS AND MENOPAUSE   Osteoporosis is a disease in which the bones lose minerals and strength with aging. This can result in serious bone fractures. Your risk for osteoporosis can be identified using a bone density scan.  If you are 61 years of age or older, or if you are at risk for osteoporosis and fractures, ask your health care provider if you should be screened.  Ask your health care provider whether you should take a calcium or vitamin D supplement to lower your risk for osteoporosis.  Menopause may have certain physical symptoms and risks.  Hormone replacement therapy may reduce some of these symptoms and risks. Talk to your health care provider about whether hormone replacement therapy is right for you.  HOME CARE INSTRUCTIONS   Schedule regular health, dental, and eye exams.  Stay current with your immunizations.   Do not use any tobacco products including cigarettes, chewing tobacco, or electronic cigarettes.  If you are pregnant, do not drink alcohol.  If you are breastfeeding, limit how much and how often you drink alcohol.  Limit alcohol intake to no more than 1 drink per day for nonpregnant women. One drink equals 12 ounces of beer, 5 ounces of wine, or 1 ounces of hard liquor.  Do not use street drugs.  Do not share needles.  Ask your health care provider for help if  you need support or information about quitting drugs.  Tell your health care provider if you often feel depressed.  Tell your health care provider if you have ever been abused or do not feel safe at home.   This information is not intended to replace advice given to you by your health care provider. Make sure you discuss any questions you have with your health care provider.   Document Released: 06/25/2011 Document Revised: 12/31/2014 Document Reviewed: 11/11/2013 Elsevier Interactive Patient Education [employer].

## 2015-10-11 NOTE — Telephone Encounter (Signed)
-----   Message -----    From: Nickola Major    Sent: 10/11/2015   2:45 PM      To: Shelly Leonard had an office visit at our office today. She hasn't had a colonoscopy so she still would like to have one at [facility]. She did see an [facility] doctor once for an office visit but according to her they didn't do a colonoscopy. I had her sign a record request form & I faxed it today. Hopefully we can schedule her soon. Thanks, Diane

## 2015-10-12 ENCOUNTER — Telehealth: Payer: Self-pay | Admitting: Family Medicine

## 2015-10-12 LAB — VITAMIN D 25 HYDROXY (VIT D DEFICIENCY, FRACTURES): Vit D, 25-Hydroxy: 63 ng/mL (ref 30–100)

## 2015-10-12 LAB — HEPATITIS C ANTIBODY: HCV Ab: NEGATIVE

## 2015-10-12 NOTE — Telephone Encounter (Signed)
Please call pt: her labs are normal. Her vitamin D is now 51. That is great. She should continue the 1000 u daily of D.

## 2015-10-12 NOTE — Telephone Encounter (Signed)
Left message with patient lab results and instructions to continue Vit D 1000u daily

## 2015-10-20 ENCOUNTER — Telehealth: Payer: Self-pay | Admitting: Family Medicine

## 2015-10-20 NOTE — Telephone Encounter (Signed)
Patient states she is having lower back pain lower left side radiates to front of abdomen. Patient denies any fever,diarrhea,nausea or vomiting. Patient states she is having regular bowel movements. Call [phone] first or [phone] to reach her in am. She feel like she needs antibiotics and something for pain . Patient has not returned call to [facility] they have been trying to contact her for appt/colonoscopy. Instructed patient to call [facility] to set up appt to establish care. Instructed patient if she develops fever, diarrhea, increased abdominal pain or nausea and vomiting report to ER. We will call her back in am to advise if she needs to come in for office visit with Dr Raoul Pitch.

## 2015-10-20 NOTE — Telephone Encounter (Signed)
Pt states her diverticulitus is acting up and she would like at RX. Please call her at [phone]

## 2015-10-21 NOTE — Telephone Encounter (Signed)
Pt should try to contact either GI (since they tried to get a hold of her) or at the least needs to come to an appointment (or go to ED if in a great deal of pain).

## 2015-10-21 NOTE — Telephone Encounter (Signed)
Spoke with patient she states she will try to get an appt with [facility] or she will call back here to get appt. She states she does not have a fever, nausea or vomiting at this time . Advised patient if increasing pain report to ED for evaluation. Patient verbalized understanding.

## 2015-10-26 ENCOUNTER — Telehealth: Payer: Self-pay | Admitting: Gastroenterology

## 2015-10-26 NOTE — Telephone Encounter (Signed)
Received records from [place]. Pt wants to transfer care because she did not agree with the plan of care she had received from [facility].  No particular MD. Doc of day for 05/19/15 in the afternoon which was Dr. Deatra Ina. 05/20/15 morning doc of day was Dr. Ardis Hughs.

## 2015-10-28 ENCOUNTER — Encounter: Payer: Self-pay | Admitting: Family Medicine

## 2015-10-28 ENCOUNTER — Ambulatory Visit (INDEPENDENT_AMBULATORY_CARE_PROVIDER_SITE_OTHER): Payer: Medicare HMO | Admitting: Family Medicine

## 2015-10-28 DIAGNOSIS — M5442 Lumbago with sciatica, left side: Secondary | ICD-10-CM | POA: Diagnosis not present

## 2015-10-28 MED ORDER — PREDNISONE 20 MG PO TABS: 20.0000 mg | ORAL_TABLET | Freq: Every day | ORAL | Status: DC

## 2015-10-28 MED ORDER — HYDROCODONE-ACETAMINOPHEN 5-325 MG PO TABS: 1.0000 | ORAL_TABLET | Freq: Three times a day (TID) | ORAL | Status: DC | PRN

## 2015-10-28 NOTE — Patient Instructions (Signed)
Prednisone called in for you to take for 10 days. Norco for moderate to severe, pain only.  Heat therapy.   Follow up 2 weeks.

## 2015-10-28 NOTE — Progress Notes (Signed)
Subjective:    Patient ID: Shelly Leonard, female    DOB: [birth year], 69 y.o.   MRN: [id number]  HPI  Left lower back pain with sciatica: Patient presents for acute office visit today for her left lower back pain, which she points to her midline lumbar spine and left SI joint. She states the pain is worse in the morning and standing. She has tried to take Tylenol, which his not helped with her pain. This acute back pain has been worsening over the last 1 week. She states that she has had these symptoms intermittently for over 10 years, and this is consistent with her prior back pain flares. She states the pain is mostly on the left, really hurts when she attempts to move. She denies any prior or present back injury or surgery. Patient denies any recent strenuous activity lifting changes in activity, saddle anesthesia bladder or bowel changes. Patient has responded to prednisone in the past. MRI in 2008 most significant for L4-5 with a broad-based posterior lateral and foraminal disc protrusion on the right, with right L4 nerve root compression. Mild to moderate central and right greater than left lateral recess stenosis at that level. Significant degenerative disc disease L5-S1, with a broad-based central and left-sided disc protrusion causing significant left lateral recess and foraminal stenosis. Left L5 nerve root compression. Lesser spondolysis  at L2-3 and L3-4. No nerve root compression at those levels. Sagittal images demonstrate a disc bulging and facet hypertrophy T11-12 with resulting effacement of the CSF surrounding the cord and probable slight cord flattening   Past Medical History  Diagnosis Date  . PONV (postoperative nausea and vomiting)   . Hypertension   . Anemia     low iron (hgb 8)  . GERD (gastroesophageal reflux disease)   . Headache(784.0)     occasional   . Arthritis     knees and hands   . Vertigo   . Vitamin D deficiency   . Osteopenia   . Carpal tunnel syndrome    . Diverticulitis large intestine w/o perforation or abscess w/bleeding    No Known Allergies Past Surgical History  Procedure Laterality Date  . Knee arthroscopy      right knee 2012   . Carpal tunnel release      left   . Other surgical history      spur removed top of left foot   . Breast reduction surgery  1982  . Total knee arthroplasty  04/21/2012    Procedure: TOTAL KNEE ARTHROPLASTY;  Surgeon: [name], MD;  Location: [facility];  Service: Orthopedics;  Laterality: Right;  . Vaginal hysterectomy  1977   Social History   Social History  . Marital Status: Married    Spouse Name: N/A  . Number of Children: 2  . Years of Education: N/A   Occupational History  . Not on file.   Social History Main Topics  . Smoking status: Never Smoker   . Smokeless tobacco: Never Used  . Alcohol Use: No  . Drug Use: No  . Sexual Activity: Yes    Birth Control/ Protection: Post-menopausal   Other Topics Concern  . Not on file   Social History Narrative   Shelly Leonard lives with husband. She is musician-plays at churches.    Review of Systems Negative, with the exception of above mentioned in HPI    Objective:   Physical Exam BP 150/90 mmHg  Pulse 65  Temp(Src) 97.9 F (36.6 C) (Oral)  Resp 20  Wt 193 lb 4 oz (87.658 kg)  SpO2 98% Gen: Afebrile. No acute distress. Nontoxic in appearance, well-developed, well-nourished, very pleasant African-American female. MSK/Neuro: Lumbar spine: No erythema, no tissue texture changes, tender to palpation L3-L5 and left SI joint. No bony step-off. Moderate pain with extension. No discomfort with flexion. Moderate to severe discomfort with left side bending, with reproduction of radiation of pain down left leg to ankle. Discomfort with left rotation. Positive left straight leg raise, positive left FABRE for SI pain. Muscle strength 5/5 right lower extremity, 4/5 hip flexion, and 4/5 dorsi flexion/plantar flexion of left foot.  Difficult to  obtain adequate deep tendon reflexes.    Assessment & Plan:  1. Left-sided low back pain with left-sided sciatica - Discussed in detail with patient today, that her back issues may have worsened since her MRI in 2008. Will attempt trial of prednisone, rest, heat application. Patient was prescribed Norco for moderate to severe pain, patient is aware this is a very short-term medication. - predniSONE (DELTASONE) 20 MG tablet; Take 1 tablet (20 mg total) by mouth daily with breakfast. 60 mg x3 days, 40 mg x3 days, 20 mg x2 days, 10 mg x2 days.  Dispense: 18 tablet; Refill: 0 - HYDROcodone-acetaminophen (NORCO) 5-325 MG tablet; Take 1 tablet by mouth every 8 (eight) hours as needed for moderate pain or severe pain.  Dispense: 30 tablet; Refill: 0 - Patient advised to follow-up in 2 weeks, if no improvement will obtain additional imaging versus referral at that time.  Follow-up 2 weeks

## 2015-11-02 NOTE — Telephone Encounter (Signed)
Dr. Ardis Hughs reviewed records and has accepted. Ok to schedule OV. Office visit scheduled for 01/11/2016.

## 2015-11-22 ENCOUNTER — Other Ambulatory Visit: Payer: Self-pay | Admitting: Family Medicine

## 2015-11-22 DIAGNOSIS — I10 Essential (primary) hypertension: Secondary | ICD-10-CM

## 2015-11-22 MED ORDER — TRIAMTERENE-HCTZ 75-50 MG PO TABS: 1.0000 | ORAL_TABLET | Freq: Every day | ORAL | Status: DC

## 2015-11-29 ENCOUNTER — Encounter: Payer: Self-pay | Admitting: Family Medicine

## 2015-11-29 ENCOUNTER — Ambulatory Visit (INDEPENDENT_AMBULATORY_CARE_PROVIDER_SITE_OTHER): Payer: Medicare HMO | Admitting: Family Medicine

## 2015-11-29 VITALS — BP 144/80 | HR 86 | Temp 98.0°F | Resp 20 | Wt 194.5 lb

## 2015-11-29 DIAGNOSIS — Z299 Encounter for prophylactic measures, unspecified: Secondary | ICD-10-CM | POA: Diagnosis not present

## 2015-11-29 DIAGNOSIS — M5410 Radiculopathy, site unspecified: Secondary | ICD-10-CM | POA: Diagnosis not present

## 2015-11-29 DIAGNOSIS — Z23 Encounter for immunization: Secondary | ICD-10-CM

## 2015-11-29 NOTE — Progress Notes (Signed)
   Subjective:    Patient ID: Shelly Leonard, female    DOB: [birth year], 69 y.o.   MRN: [id number]  HPI  Left-sided back pain with left-sided sciatica: Patient presents for follow-up visit to her left-sided back pain with left-sided sciatica. Patient states her pain is much improved, after prednisone burst. She is not taking the Norco, and only used a few of her prescription. She has been continuing stretches provided to her daily. She still has mild radiation of pain in the L4/L5 dermatome pattern, from hip to ankle.  Prior MRI 2008 MRI in 2008 most significant for L4-5 with a broad-based posterior lateral and foraminal disc protrusion on the right, with right L4 nerve root compression. Mild to moderate central and right greater than left lateral recess stenosis at that level. Significant degenerative disc disease L5-S1, with a broad-based central and left-sided disc protrusion causing significant left lateral recess and foraminal stenosis. Left L5 nerve root compression. Lesser spondolysis at L2-3 and L3-4. No nerve root compression at those levels. Sagittal images demonstrate a disc bulging and facet hypertrophy T11-12 with resulting effacement of the CSF surrounding the cord and probable slight cord flattening   Past Medical History  Diagnosis Date  . PONV (postoperative nausea and vomiting)   . Hypertension   . Anemia     low iron (hgb 8)  . GERD (gastroesophageal reflux disease)   . Headache(784.0)     occasional   . Arthritis     knees and hands   . Vertigo   . Vitamin D deficiency   . Osteopenia   . Carpal tunnel syndrome   . Diverticulitis large intestine w/o perforation or abscess w/bleeding    No Known Allergies   Review of Systems Negative, with the exception of above mentioned in HPI     Objective:   Physical Exam BP 144/80 mmHg  Pulse 86  Temp(Src) 98 F (36.7 C) (Oral)  Resp 20  Wt 194 lb 8 oz (88.225 kg)  SpO2 98% Gen: Afebrile. No acute distress.  Nontoxic in appearance, well-developed, well-nourished, African-American female. Very pleasant. HENT: AT. [place].  MMM.  Eyes:Pupils Equal Round Reactive to light, Extraocular movements intact,  Conjunctiva without redness, discharge or icterus. MSK: Lumbar spine: No erythema, no tissue texture changes, no tenderness to palpation.  No bony step-off. Mild pain with extension. No discomfort with flexion. No discomfort with left side bending. Positive left straight leg raise, positive left FABRE for SI pain. Muscle strength 5/5 right lower extremity, 4/5 hip flexion, and 4/5 dorsi flexion/plantar flexion of left foot.  Difficult to obtain adequate deep tendon reflexes.     Assessment & Plan:  1. Need for vaccination/preventive measure - Pneumococcal polysaccharide vaccine 23-valent greater than or equal to 2yo subcutaneous/IM - PCV 13 greater and one half years ago, this should   2. Back pain with left-sided radiculopathy - Much improved, but without resolution. Discussed the hospital need to send a specialist. Also discussed gabapentin for radicular symptoms, however radicular symptoms are intermittent at this time. Patient decided she will call in her flares are becoming worse, she experienced weakness where she desires referral. Back pain: improved.  Preventive; PPSV 23 due, PCV 13 give 2015

## 2015-11-29 NOTE — Patient Instructions (Addendum)
Lumbosacral Radiculopathy Lumbosacral radiculopathy is a condition that involves the spinal nerves and nerve roots in the low back and bottom of the spine. The condition develops when these nerves and nerve roots move out of place or become inflamed and cause symptoms. CAUSES This condition may be caused by:  Pressure from a disk that bulges out of place (herniated disk). A disk is a plate of cartilage that separates bones in the spine.  Disk degeneration.  A narrowing of the bones of the lower back (spinal stenosis).  A tumor.  An infection.  An injury that places sudden pressure on the disks that cushion the bones of your lower spine. RISK FACTORS This condition is more likely to develop in:  Males aged 30-50 years.  Females aged 69-60 years.  People who lift improperly.  People who are overweight or live a sedentary lifestyle.  People who smoke.  People who perform repetitive activities that strain the spine. SYMPTOMS Symptoms of this condition include:  Pain that goes down from the back into the legs (sciatica). This is the most common symptom. The pain may be worse with sitting, coughing, or sneezing.  Pain and numbness in the arms and legs.  Muscle weakness.  Tingling.  Loss of bladder control or bowel control. DIAGNOSIS This condition is diagnosed with a physical exam and medical history. If the pain is lasting, you may have tests, such as:  MRI scan.  X-ray.  CT scan.  Myelogram.  Nerve conduction study. TREATMENT This condition is often treated with:  Hot packs and ice applied to affected areas.  Stretches to improve flexibility.  Exercises to strengthen back muscles.  Physical therapy.  Pain medicine.  A steroid injection in the spine. In some cases, no treatment is needed. If the condition is long-lasting (chronic), or if symptoms are severe, treatment may involve surgery or lifestyle changes, such as following a weight loss plan. HOME  CARE INSTRUCTIONS Medicines  Take medicines only as directed by your health care provider.  Do not drive or operate heavy machinery while taking pain medicine. Injury Care  Apply a heat pack to the injured area as directed by your health care provider.  Apply ice to the affected area:  Put ice in a plastic bag.  Place a towel between your skin and the bag.  Leave the ice on for 20-30 minutes, every 2 hours while you are awake or as needed. Or, leave the ice on for as long as directed by your health care provider. Other Instructions  If you were shown how to do any exercises or stretches, do them as directed by your health care provider.  If your health care provider prescribed a diet or exercise program, follow it as directed.  Keep all follow-up visits as directed by your health care provider. This is important. SEEK MEDICAL CARE IF:  Your pain does not improve over time even when taking pain medicines. SEEK IMMEDIATE MEDICAL CARE IF:  Your develop severe pain.  Your pain suddenly gets worse.  You develop increasing weakness in your legs.  You lose the ability to control your bladder or bowel.  You have difficulty walking or balancing.  You have a fever.   This information is not intended to replace advice given to you by your health care provider. Make sure you discuss any questions you have with your health care provider.   Document Released: 12/10/2005 Document Revised: 04/26/2015 Document Reviewed: 12/06/2014 Elsevier Interactive Patient Education [employer].  All of your pneumonia vaccinations are completed Forever!!! :)

## 2015-12-20 NOTE — Telephone Encounter (Signed)
Appointment has been scheduled.

## 2016-01-11 ENCOUNTER — Ambulatory Visit: Payer: Self-pay | Admitting: Gastroenterology

## 2016-02-03 ENCOUNTER — Ambulatory Visit (INDEPENDENT_AMBULATORY_CARE_PROVIDER_SITE_OTHER): Payer: Medicare HMO | Admitting: Family Medicine

## 2016-02-03 ENCOUNTER — Encounter: Payer: Self-pay | Admitting: Family Medicine

## 2016-02-03 DIAGNOSIS — J0100 Acute maxillary sinusitis, unspecified: Secondary | ICD-10-CM | POA: Insufficient documentation

## 2016-02-03 MED ORDER — FLUTICASONE PROPIONATE 50 MCG/ACT NA SUSP: 2.0000 | Freq: Every day | NASAL | Status: DC

## 2016-02-03 MED ORDER — PREDNISONE 20 MG PO TABS: 20.0000 mg | ORAL_TABLET | Freq: Every day | ORAL | Status: DC

## 2016-02-03 MED ORDER — AMOXICILLIN-POT CLAVULANATE 875-125 MG PO TABS: 1.0000 | ORAL_TABLET | Freq: Two times a day (BID) | ORAL | Status: DC

## 2016-02-03 NOTE — Patient Instructions (Signed)

## 2016-02-03 NOTE — Progress Notes (Signed)
Patient ID: Shelly Leonard, female   DOB: Nov 10, 1946, 70 y.o.   MRN: [id number]    Shelly Leonard , 03/20/1946, 70 y.o., female MRN: [id number]  CC:  Subjective: Pt presents for an acute OV with complaints of nasal congestion, headache, dry cough, sneezing of 7 days or more duration.  Associated symptoms include sore neck (right trapezium) and decreased appetite. Pt feels symptoms are worse in the morning and middle of the day and better with a little hot tea/honey and coricidin. Put feels her symptoms are worsening. She denies fever, chills, nausea, vomot  No Known Allergies Social History  Substance Use Topics  . Smoking status: Never Smoker   . Smokeless tobacco: Never Used  . Alcohol Use: No   Past Medical History  Diagnosis Date  . PONV (postoperative nausea and vomiting)   . Hypertension   . Anemia     low iron (hgb 8)  . GERD (gastroesophageal reflux disease)   . Headache(784.0)     occasional   . Arthritis     knees and hands   . Vertigo   . Vitamin D deficiency   . Osteopenia   . Carpal tunnel syndrome   . Diverticulitis large intestine w/o perforation or abscess w/bleeding    Past Surgical History  Procedure Laterality Date  . Knee arthroscopy      right knee 2012   . Carpal tunnel release      left   . Other surgical history      spur removed top of left foot   . Breast reduction surgery  1982  . Total knee arthroplasty  04/21/2012    Procedure: TOTAL KNEE ARTHROPLASTY;  Surgeon: [name], MD;  Location: [facility];  Service: Orthopedics;  Laterality: Right;  . Vaginal hysterectomy  1977   Family History  Problem Relation Age of Onset  . Breast cancer Mother     late in life  . Diabetes Mother     late in life  . Heart disease Father   . Lung disease Father      Medication List       This list is accurate as of: 02/03/16  9:07 AM.  Always use your most recent med list.               acetaminophen 500 MG tablet  Commonly known as:   TYLENOL  Take 1,000 mg by mouth every 6 (six) hours as needed for mild pain or headache.     Blood Pressure Monitor/L Cuff Misc  Please supply automatic BP cuff/device (please fit)     clotrimazole 1 % cream  Commonly known as:  LOTRIMIN  Apply 1 application topically 2 (two) times daily.     Garlic 123XX123 MG Tabs  Take 100 mg by mouth daily with breakfast.     HYDROcodone-acetaminophen 5-325 MG tablet  Commonly known as:  NORCO  Take 1 tablet by mouth every 8 (eight) hours as needed for moderate pain or severe pain.     multivitamin with minerals Tabs tablet  Take 1 tablet by mouth daily with breakfast.     OMEGA 3 PO  Take 1 capsule by mouth daily.     pantoprazole 40 MG tablet  Commonly known as:  PROTONIX  Take 40 mg by mouth daily as needed.     triamterene-hydrochlorothiazide 75-50 MG tablet  Commonly known as:  MAXZIDE  Take 1 tablet by mouth daily with breakfast.     verapamil 120  MG tablet  Commonly known as:  CALAN  Take 1 tablet (120 mg total) by mouth once.         ROS: Negative, with the exception of above mentioned in HPI   Objective:  BP 119/83 mmHg  Pulse 72  Temp(Src) 98.1 F (36.7 C) (Oral)  Resp 20  Wt 196 lb 4 oz (89.018 kg)  SpO2 95% Body mass index is 34.77 kg/(m^2). Gen: Afebrile. No acute distress. Nontoxic in appearance. AAF, very pleasant HENT: AT. [place]. Bilateral TM visualized Shiney/full, no erythema. MMM, no oral lesions. Bilateral nares severe swelling and erythema. Throat without erythema or exudates. Mild cough, no hoarseness, TTP bilateral max sinus Eyes:Pupils Equal Round Reactive to light, Extraocular movements intact,  Conjunctiva without redness, discharge or icterus. Neck/lymp/endocrine: Supple,right ant cervical lymphadenopathy CV: RRR  Chest: CTAB, no wheeze or crackles. Good air movement, normal resp effort.  Abd: Soft. Round,  NTND. BS present MSK: FROM neck, neg kernig's and brudinski Skin: No rashes, purpura or  petechiae.  Neuro: Normal gait. PERLA. EOMi. Alert. Oriented x3   Assessment/Plan: ZEPHORA Shelly Leonard is a 70 y.o. female present for acute OV for  Acute maxillary sinusitis, recurrence not specified - Rest, hydrate, flonase, mucinex (plain), seroid taper, abx - amoxicillin-clavulanate (AUGMENTIN) 875-125 MG tablet; Take 1 tablet by mouth 2 (two) times daily.  Dispense: 20 tablet; Refill: 0 - fluticasone (FLONASE) 50 MCG/ACT nasal spray; Place 2 sprays into both nostrils daily.  Dispense: 16 g; Refill: 6 - predniSONE (DELTASONE) 20 MG tablet; Take 1 tablet (20 mg total) by mouth daily with breakfast. 60 mg x3 days, 40 mg x3 days, 20 mg x2 days, 10 mg x2 days.  Dispense: 18 tablet; Refill: 0   Shelly Kuneff, DO  [place]

## 2016-03-06 DIAGNOSIS — H2513 Age-related nuclear cataract, bilateral: Secondary | ICD-10-CM | POA: Diagnosis not present

## 2016-03-06 DIAGNOSIS — H10413 Chronic giant papillary conjunctivitis, bilateral: Secondary | ICD-10-CM | POA: Diagnosis not present

## 2016-03-06 DIAGNOSIS — H43812 Vitreous degeneration, left eye: Secondary | ICD-10-CM | POA: Diagnosis not present

## 2016-03-06 DIAGNOSIS — H43811 Vitreous degeneration, right eye: Secondary | ICD-10-CM | POA: Diagnosis not present

## 2016-03-13 ENCOUNTER — Ambulatory Visit (INDEPENDENT_AMBULATORY_CARE_PROVIDER_SITE_OTHER): Payer: Medicare HMO | Admitting: Gastroenterology

## 2016-03-13 ENCOUNTER — Encounter: Payer: Self-pay | Admitting: Gastroenterology

## 2016-03-13 ENCOUNTER — Other Ambulatory Visit: Payer: Self-pay

## 2016-03-13 VITALS — BP 150/80 | HR 78 | Ht 63.0 in | Wt 198.0 lb

## 2016-03-13 DIAGNOSIS — K5732 Diverticulitis of large intestine without perforation or abscess without bleeding: Secondary | ICD-10-CM | POA: Diagnosis not present

## 2016-03-13 MED ORDER — METOCLOPRAMIDE HCL 5 MG PO TABS: 5.0000 mg | ORAL_TABLET | ORAL | Status: DC

## 2016-03-13 NOTE — Progress Notes (Signed)
HPI: This is Leonard     very pleasant 70 year old woman   who was referred to me by Shelly Pouch A, DO  to evaluate  previous diverticulitis .    Chief complaint is diverticulitis 2016  She had Leonard colonoscopy with Dr. Leonie Leonard in 2007. It was normal. He recommended she repeat colon cancer screening with colonoscopy at 10 year interval  Presented with what sounds like acute sigmoid diverticulitis February 2016. CBC done at that time showed elevated white blood cell count at 18,000. CT scan done at that time with IV and oral contrast confirmed sigmoid inflammation consistent with possible diverticulitis.  Her pains improved after Leonard course of antibiotics.  She has not been bothered since then, 01/2015, with any abd pains.  Has 2-3 formed BMs daily.  She avoids seeds, corn. No bleeding.  Overall her weight has been overall stable, it does fluctuate.  Review of systems: Pertinent positive and negative review of systems were noted in the above HPI section. Complete review of systems was performed and was otherwise normal.   Past Medical History  Diagnosis Date  . PONV (postoperative nausea and vomiting)   . Hypertension   . Anemia     low iron (hgb 8)  . GERD (gastroesophageal reflux disease)   . Headache(784.0)     occasional   . Arthritis     knees and hands   . Vertigo   . Vitamin D deficiency   . Osteopenia   . Carpal tunnel syndrome   . Diverticulitis large intestine w/o perforation or abscess w/bleeding     Past Surgical History  Procedure Laterality Date  . Knee arthroscopy      right knee 2012   . Carpal tunnel release      left   . Other surgical history      spur removed top of left foot   . Breast reduction surgery  1982  . Total knee arthroplasty  04/21/2012    Procedure: TOTAL KNEE ARTHROPLASTY;  Surgeon: Shelly Pole, MD;  Location: [facility];  Service: Orthopedics;  Laterality: Right;  . Vaginal hysterectomy  1977    Current Outpatient Prescriptions  Medication  Sig Dispense Refill  . acetaminophen (TYLENOL) 500 MG tablet Take 1,000 mg by mouth every 6 (six) hours as needed for mild pain or headache.    . Blood Pressure Monitoring (BLOOD PRESSURE MONITOR/L CUFF) MISC Please supply automatic BP cuff/device (please fit) 1 each 0  . clotrimazole (LOTRIMIN) 1 % cream Apply 1 application topically 2 (two) times daily. 28 g 0  . fluticasone (FLONASE) 50 MCG/ACT nasal spray Place 2 sprays into both nostrils daily. 16 g 6  . Garlic 123XX123 MG TABS Take 100 mg by mouth daily with breakfast.     . HYDROcodone-acetaminophen (NORCO) 5-325 MG tablet Take 1 tablet by mouth every 8 (eight) hours as needed for moderate pain or severe pain. 30 tablet 0  . Multiple Vitamin (MULITIVITAMIN WITH MINERALS) TABS Take 1 tablet by mouth daily with breakfast.    . Omega-3 Fatty Acids (OMEGA 3 PO) Take 1 capsule by mouth daily.    . pantoprazole (PROTONIX) 40 MG tablet Take 40 mg by mouth daily as needed.    . predniSONE (DELTASONE) 20 MG tablet Take 1 tablet (20 mg total) by mouth daily with breakfast. 60 mg x3 days, 40 mg x3 days, 20 mg x2 days, 10 mg x2 days. 18 tablet 0  . triamterene-hydrochlorothiazide (MAXZIDE) 75-50 MG tablet Take 1  tablet by mouth daily with breakfast. 90 tablet 1  . verapamil (CALAN) 120 MG tablet Take 1 tablet (120 mg total) by mouth once. 90 tablet 1   No current facility-administered medications for this visit.    Allergies as of 03/13/2016  . (No Known Allergies)    Family History  Problem Relation Age of Onset  . Breast cancer Mother     late in life  . Diabetes Mother     late in life  . Heart disease Father   . Lung disease Father     Social History   Social History  . Marital Status: Married    Spouse Name: N/Leonard  . Number of Children: 2  . Years of Education: N/Leonard   Occupational History  . Not on file.   Social History Main Topics  . Smoking status: Never Smoker   . Smokeless tobacco: Never Used  . Alcohol Use: No  . Drug Use:  No  . Sexual Activity: Yes    Birth Control/ Protection: Post-menopausal   Other Topics Concern  . Not on file   Social History Narrative   Shelly Leonard lives with husband. She is musician-plays at churches.     Physical Exam: Ht 5\' 3"  (1.6 m)  Wt 198 lb (89.812 kg)  BMI 35.08 kg/m2 Constitutional: generally well-appearing Psychiatric: alert and oriented x3 Eyes: extraocular movements intact Mouth: oral pharynx moist, no lesions Neck: supple no lymphadenopathy Cardiovascular: heart regular rate and rhythm Lungs: clear to auscultation bilaterally Abdomen: soft, nontender, nondistended, no obvious ascites, no peritoneal signs, normal bowel sounds Extremities: no lower extremity edema bilaterally Skin: no lesions on visible extremities   Assessment and plan: 70 y.o. female with  "diverticulitis 2016", history of normal colonoscopy 2007  Her symptoms improved in 2016 after Leonard typical course of antibiotics and so I do suspect she indeed had acute mild sigmoid diverticulitis. She has not had colon cancer screening in about 10 years from what I can tell, her last colonoscopy was in 2007 and I recommended we repeat that now at her soonest convenience. She was pretty adamant about not having Leonard split dose prep since she has significant trouble getting up early in the morning and is wants to just simply take it all at once. I did explain to her that split dose preps are more standard of care now because they give her more reliably clean examination. She understands and still wishes for standard single dose prep.   Shelly Loffler, MD [facility] 03/13/2016, 1:24 PM  Cc: Shelly Hillock, DO

## 2016-03-13 NOTE — Patient Instructions (Signed)
You will be set up for a colonoscopy for routine risk colon cancer screening, 2016 diverticulitis.  Since you prefer not a split dose prep, we will order an older form of the prep for you and you understand that single dose prep MAY lead to less than adequate prep for the procedure.

## 2016-04-05 ENCOUNTER — Encounter: Payer: Self-pay | Admitting: Family Medicine

## 2016-04-05 ENCOUNTER — Ambulatory Visit (INDEPENDENT_AMBULATORY_CARE_PROVIDER_SITE_OTHER): Payer: Medicare HMO | Admitting: Family Medicine

## 2016-04-05 VITALS — BP 109/70 | HR 76 | Temp 98.1°F | Resp 20 | Wt 194.5 lb

## 2016-04-05 DIAGNOSIS — B356 Tinea cruris: Secondary | ICD-10-CM | POA: Insufficient documentation

## 2016-04-05 MED ORDER — CICLOPIROX OLAMINE 0.77 % EX CREA: TOPICAL_CREAM | Freq: Two times a day (BID) | CUTANEOUS | Status: DC

## 2016-04-05 NOTE — Patient Instructions (Signed)
Cutaneous Candidiasis °Cutaneous candidiasis is a condition in which there is an overgrowth of yeast (candida) on the skin. Yeast normally live on the skin, but in small enough numbers not to cause any symptoms. In certain cases, increased growth of the yeast may cause an actual yeast infection. This kind of infection usually occurs in areas of the skin that are constantly warm and moist, such as the armpits or the groin. Yeast is the most common cause of diaper rash in babies and in people who cannot control their bowel movements (incontinence). °CAUSES  °The fungus that most often causes cutaneous candidiasis is Candida albicans. Conditions that can increase the risk of getting a yeast infection of the skin include: °· Obesity. °· Pregnancy. °· Diabetes. °· Taking antibiotic medicine. °· Taking birth control pills. °· Taking steroid medicines. °· Thyroid disease. °· An iron or zinc deficiency. °· Problems with the immune system. °SYMPTOMS  °· Red, swollen area of the skin. °· Bumps on the skin. °· Itchiness. °DIAGNOSIS  °The diagnosis of cutaneous candidiasis is usually based on its appearance. Light scrapings of the skin may also be taken and viewed under a microscope to identify the presence of yeast. °TREATMENT  °Antifungal creams may be applied to the infected skin. In severe cases, oral medicines may be needed.  °HOME CARE INSTRUCTIONS  °· Keep your skin clean and dry. °· Maintain a healthy weight. °· If you have diabetes, keep your blood sugar under control. °SEEK IMMEDIATE MEDICAL CARE IF: °· Your rash continues to spread despite treatment. °· You have a fever, chills, or abdominal pain. °  °This information is not intended to replace advice given to you by your health care provider. Make sure you discuss any questions you have with your health care provider. °  °Document Released: 08/28/2011 Document Revised: 03/03/2012 Document Reviewed: 06/13/2015 °Elsevier Interactive Patient Education ©2016 Elsevier  Inc. ° °

## 2016-04-05 NOTE — Progress Notes (Signed)
Patient ID: JENNIVER PURCELL, female   DOB: 20-Apr-1946, 70 y.o.   MRN: [id number]    DASHA OLVER , 01/17/1946, 70 y.o., female MRN: [id number]  CC: Skin rash Subjective: Skin rash: Patient presents to the office for an acute complaint of skin rash between her inner thigh leg folds for approximately one week in duration. She states she has had a similar rash in the past, usually occurs during more warm months where she sweating. Typically over-the-counter medications/creams seem to be effective, however she has tried these and it has not effective this time. She is uncertain what over-the-counter medication cream she is using. She denies any vaginal irritation or discharge. She states that the itching and irritation is at between her legs folds and right underneath her belly/pubic. Patient is not a diabetic.  Back pain: Patient has been having problems with her back intermittently through the last few years. She has questions today regarding whether or not she should do something else for her back. She is continuing to daily exercises. She has started walking more frequently. She has lost 3-1/2 pounds. She takes Tylenol for pain. She has a prescription for Norco, but states she only takes that if it really hurts. The pain is mostly in her lower left back, without any radiation.  Prior MRI 2008 MRI in 2008 most significant for L4-5 with a broad-based posterior lateral and foraminal disc protrusion on the right, with right L4 nerve root compression. Mild to moderate central and right greater than left lateral recess stenosis at that level. Significant degenerative disc disease L5-S1, with a broad-based central and left-sided disc protrusion causing significant left lateral recess and foraminal stenosis. Left L5 nerve root compression. Lesser spondolysis at L2-3 and L3-4. No nerve root compression at those levels. Sagittal images demonstrate a disc bulging and facet hypertrophy T11-12 with resulting  effacement of the CSF surrounding the cord and probable slight cord flattening   No Known Allergies Social History  Substance Use Topics  . Smoking status: Never Smoker   . Smokeless tobacco: Never Used  . Alcohol Use: No   Past Medical History  Diagnosis Date  . PONV (postoperative nausea and vomiting)   . Hypertension   . Anemia     low iron (hgb 8)  . GERD (gastroesophageal reflux disease)   . Headache(784.0)     occasional   . Arthritis     knees and hands   . Vertigo   . Vitamin D deficiency   . Osteopenia   . Carpal tunnel syndrome   . Diverticulitis large intestine w/o perforation or abscess w/bleeding    Past Surgical History  Procedure Laterality Date  . Knee arthroscopy      right knee 2012   . Carpal tunnel release      left   . Other surgical history      spur removed top of left foot   . Breast reduction surgery  1982  . Total knee arthroplasty  04/21/2012    Procedure: TOTAL KNEE ARTHROPLASTY;  Surgeon: [name], MD;  Location: [facility];  Service: Orthopedics;  Laterality: Right;  . Vaginal hysterectomy  1977   Family History  Problem Relation Age of Onset  . Breast cancer Mother     late in life  . Diabetes Mother     late in life  . Heart disease Father   . Lung disease Father      Medication List       This  list is accurate as of: 04/05/16 11:36 AM.  Always use your most recent med list.               acetaminophen 500 MG tablet  Commonly known as:  TYLENOL  Take 1,000 mg by mouth every 6 (six) hours as needed for mild pain or headache.     Blood Pressure Monitor/L Cuff Misc  Please supply automatic BP cuff/device (please fit)     clotrimazole 1 % cream  Commonly known as:  LOTRIMIN  Apply 1 application topically 2 (two) times daily.     fluticasone 50 MCG/ACT nasal spray  Commonly known as:  FLONASE  Place 2 sprays into both nostrils daily.     Garlic 123XX123 MG Tabs  Take 100 mg by mouth daily with breakfast.      HYDROcodone-acetaminophen 5-325 MG tablet  Commonly known as:  NORCO  Take 1 tablet by mouth every 8 (eight) hours as needed for moderate pain or severe pain.     metoCLOPramide 5 MG tablet  Commonly known as:  REGLAN  Take 1 tablet (5 mg total) by mouth as directed. Take as directed per colon instructions     multivitamin with minerals Tabs tablet  Take 1 tablet by mouth daily with breakfast.     OMEGA 3 PO  Take 1 capsule by mouth daily.     pantoprazole 40 MG tablet  Commonly known as:  PROTONIX  Take 40 mg by mouth daily as needed.     triamterene-hydrochlorothiazide 75-50 MG tablet  Commonly known as:  MAXZIDE  Take 1 tablet by mouth daily with breakfast.     verapamil 120 MG tablet  Commonly known as:  CALAN  Take 1 tablet (120 mg total) by mouth once.       ROS: Negative, with the exception of above mentioned in HPI  Objective:  BP 109/70 mmHg  Pulse 76  Temp(Src) 98.1 F (36.7 C)  Resp 20  Wt 194 lb 8 oz (88.225 kg)  SpO2 97% Body mass index is 34.46 kg/(m^2). Gen: Afebrile. No acute distress. Nontoxic in appearance, well developed, well nourished female.  HENT: AT. [place].MMM, no oral lesions.  Eyes:Pupils Equal Round Reactive to light, Extraocular movements intact,  Conjunctiva without redness, discharge or icterus. Skin: Flat Erythemic skin changes bilateral inner thigh leg fold and just below abdominal fold midline, no purpura or petechiae.  Neuro: Normal gait. PERLA. EOMi. Alert. Oriented x3   Assessment/Plan: LEELEE BEARDEN is a 70 y.o. female present for acute OV for  Tinea cruris/Yeast infection skin - Yeast versus other fungal infection of the skin in between inner thigh leg folds and abdominal fold. Discussed with patient use of medication as 2 times a day until area is cleared. If it is not effective in clearing the area within 4 weeks we would need to revisit other possible etiologies of the rash. Patient was encouraged to keep area clean and dry.  Replacing washcloths and towels daily. - ciclopirox (LOPROX) 0.77 % cream; Apply topically 2 (two) times daily.  Dispense: 30 g; Refill: 0 - Follow-up 4 weeks  Back pain: Discussed with patient today, her options consistent with living with the discomfort that she currently feels in until its to the point where it is bothering her more often. We could also consider ending to a neurologist, getting updated MRI as well. Patient is not currently ready to have injections or surgery. She agrees to return if/when she has back flares and be  treated per flair, and if worsens been week can move  further with retreatment. Follow-up when necessary   electronically signed by:  Howard Pouch, DO  [place]

## 2016-04-10 ENCOUNTER — Encounter: Payer: Self-pay | Admitting: Gastroenterology

## 2016-04-23 ENCOUNTER — Telehealth: Payer: Self-pay | Admitting: Family Medicine

## 2016-04-23 NOTE — Telephone Encounter (Signed)
Patient would like to have her bone density scan done. She said her last one was done at [facility].

## 2016-05-01 NOTE — Telephone Encounter (Signed)
Is  She medicare? If she is medicare she can have one every 2-3 years. Her last was 2015- with osteopenia by records. If she is medicare she needs a MWV, if she is private insurance she should have a preventive visit so we can discuss, if she has not had one of them in the last year. This is information I need before receiving request. Thanks.

## 2016-05-02 NOTE — Telephone Encounter (Signed)
Patient has Shelly Leonard PPO

## 2016-05-02 NOTE — Telephone Encounter (Signed)
Left message for patient to call and schedule Medicare wellness visit to address any testing that might be due.

## 2016-05-08 ENCOUNTER — Encounter: Payer: Self-pay | Admitting: Gastroenterology

## 2016-05-08 ENCOUNTER — Encounter: Payer: Self-pay | Admitting: Family Medicine

## 2016-05-08 ENCOUNTER — Ambulatory Visit (INDEPENDENT_AMBULATORY_CARE_PROVIDER_SITE_OTHER): Payer: Medicare HMO | Admitting: Family Medicine

## 2016-05-08 VITALS — BP 117/77 | HR 71 | Temp 97.8°F | Resp 20 | Ht 63.0 in | Wt 194.0 lb

## 2016-05-08 DIAGNOSIS — Z1231 Encounter for screening mammogram for malignant neoplasm of breast: Secondary | ICD-10-CM

## 2016-05-08 DIAGNOSIS — Z0000 Encounter for general adult medical examination without abnormal findings: Secondary | ICD-10-CM

## 2016-05-08 DIAGNOSIS — Z1239 Encounter for other screening for malignant neoplasm of breast: Secondary | ICD-10-CM

## 2016-05-08 DIAGNOSIS — M8580 Other specified disorders of bone density and structure, unspecified site: Secondary | ICD-10-CM

## 2016-05-08 DIAGNOSIS — E559 Vitamin D deficiency, unspecified: Secondary | ICD-10-CM | POA: Diagnosis not present

## 2016-05-08 DIAGNOSIS — D509 Iron deficiency anemia, unspecified: Secondary | ICD-10-CM

## 2016-05-08 DIAGNOSIS — I10 Essential (primary) hypertension: Secondary | ICD-10-CM

## 2016-05-08 DIAGNOSIS — M810 Age-related osteoporosis without current pathological fracture: Secondary | ICD-10-CM | POA: Insufficient documentation

## 2016-05-08 DIAGNOSIS — E2839 Other primary ovarian failure: Secondary | ICD-10-CM | POA: Insufficient documentation

## 2016-05-08 NOTE — Patient Instructions (Addendum)
  Ms. Semaan , Thank you for taking time to come for your Medicare Wellness Visit. I appreciate your ongoing commitment to your health goals. Please review the following plan we discussed and let me know if I can assist you in the future.   These are the goals we discussed: Goals    None      This is a list of the screening recommended for you and due dates:  Health Maintenance  Topic Date Due  . Mammogram  04/26/2016  . Flu Shot  08/24/2016*  . DEXA scan (bone density measurement)  06/03/2016  . Colon Cancer Screening  06/13/2016  . Tetanus Vaccine  08/13/2021  . Shingles Vaccine  Completed  .  Hepatitis C: One time screening is recommended by Center for Disease Control  (CDC) for  adults born from [birth year] through 1964.   Completed  . Pneumonia vaccines  Addressed  *Topic was postponed. The date shown is not the original due date.   BMP diabetes screening.  CBC anemia screen  Schedule mammogram and DEXA after June 11th Work on advanced directive and bring a copy once completed.

## 2016-05-08 NOTE — Progress Notes (Signed)
Patient ID: Shelly Leonard, female   DOB: [birth year], 70 y.o.   MRN: [id number] Medicare AWV    History of Present Ilness: Shelly Leonard, 70 y.o. , female presents today for Medicare wellness visit.  Vital Signs: BP 117/77 mmHg  Pulse 71  Temp(Src) 97.8 F (36.6 C) (Oral)  Resp 20  Ht 5\' 3"  (1.6 m)  Wt 194 lb (87.998 kg)  BMI 34.37 kg/m2  SpO2 95% List of providers/suppliers:  Updated in pts records (snapshot) Patient Care Team    Relationship Specialty Notifications Start End  Shelly Hillock, DO PCP - General Family Medicine  08/18/15   Shelly Fillers, MD Consulting Physician Ophthalmology  05/08/16   Shelly Banister, MD Attending Physician Gastroenterology  05/08/16     Past medical, surgical, family and social histories reviewed (including experiences with illnesses, hospital stays, operations, injuries, and treatments):  Past Medical History  Diagnosis Date  . PONV (postoperative nausea and vomiting)   . Hypertension   . Anemia     low iron (hgb 8)  . GERD (gastroesophageal reflux disease)   . Headache(784.0)     occasional   . Arthritis     knees and hands   . Vertigo   . Vitamin D deficiency   . Osteopenia   . Carpal tunnel syndrome   . Diverticulitis large intestine w/o perforation or abscess w/bleeding    All allergies reviewed No Known Allergies Past Surgical History  Procedure Laterality Date  . Knee arthroscopy      right knee 2012   . Carpal tunnel release      left   . Other surgical history      spur removed top of left foot   . Breast reduction surgery  1982  . Total knee arthroplasty  04/21/2012    Procedure: TOTAL KNEE ARTHROPLASTY;  Surgeon: Shelly Pole, MD;  Location: [facility];  Service: Orthopedics;  Laterality: Right;  . Vaginal hysterectomy  1977   Family History  Problem Relation Age of Onset  . Breast cancer Mother     late in life  . Diabetes Mother     late in life  . Heart disease Father   . Lung disease Father     Social History   Social History Narrative   [name] lives with husband. She is musician-plays at churches.    All medications verified   Medication List       This list is accurate as of: 05/08/16 11:21 AM.  Always use your most recent med list.               acetaminophen 500 MG tablet  Commonly known as:  TYLENOL  Take 1,000 mg by mouth every 6 (six) hours as needed for mild pain or headache.     Blood Pressure Monitor/L Cuff Misc  Please supply automatic BP cuff/device (please fit)     ciclopirox 0.77 % cream  Commonly known as:  LOPROX  Apply topically 2 (two) times daily.     clotrimazole 1 % cream  Commonly known as:  LOTRIMIN  Apply 1 application topically 2 (two) times daily.     fluticasone 50 MCG/ACT nasal spray  Commonly known as:  FLONASE  Place 2 sprays into both nostrils daily.     Garlic 123XX123 MG Tabs  Take 100 mg by mouth daily with breakfast.     HYDROcodone-acetaminophen 5-325 MG tablet  Commonly known as:  NORCO  Take 1 tablet by  mouth every 8 (eight) hours as needed for moderate pain or severe pain.     metoCLOPramide 5 MG tablet  Commonly known as:  REGLAN  Take 1 tablet (5 mg total) by mouth as directed. Take as directed per colon instructions     multivitamin with minerals Tabs tablet  Take 1 tablet by mouth daily with breakfast.     OMEGA 3 PO  Take 1 capsule by mouth daily.     pantoprazole 40 MG tablet  Commonly known as:  PROTONIX  Take 40 mg by mouth daily as needed.     triamterene-hydrochlorothiazide 75-50 MG tablet  Commonly known as:  MAXZIDE  Take 1 tablet by mouth daily with breakfast.     verapamil 120 MG tablet  Commonly known as:  CALAN  Take 1 tablet (120 mg total) by mouth once.       Exercise/Diet: Current Exercise Habits: Home exercise routine, Type of exercise: calisthenics;walking;Other - see comments (bowling), Time (Minutes): 30, Frequency (Times/Week): 7, Weekly Exercise (Minutes/Week): 210,  Intensity: Moderate   Diet: Regular diet, low salt.  Functional Status Survey: Is the patient deaf or have difficulty hearing?: No Does the patient have difficulty seeing, even when wearing glasses/contacts?: No Does the patient have difficulty concentrating, remembering, or making decisions?: No Does the patient have difficulty walking or climbing stairs?: No Does the patient have difficulty dressing or bathing?: No Does the patient have difficulty doing errands alone such as visiting a doctor's office or shopping?: No  Fall Risk  05/08/2016 05/19/2015  Falls in the past year? No Yes  Number falls in past yr: - 2 or more  Injury with Fall? - Yes   Cognitive assessment:  No barriers identified today   Hearing assessment: No barriers identified today  Depression Screening: Depression screen [facility] 2/9 05/08/2016 05/19/2015  Decreased Interest 0 0  Down, Depressed, Hopeless 0 0  PHQ - 2 Score 0 0    Advanced Care Planning: packet and instructions given to pt today.  Health maintenance:  Immunizations: Immunization History  Administered Date(s) Administered  . Pneumococcal Conjugate-13 04/01/2014  . Pneumococcal Polysaccharide-23 11/29/2015  . Td 08/14/2011  . Zoster 01/20/2013   Info -Pneumococcal (once in a lifetime after age 35); Seasonal Influenza annually; Tetanus ( Tdap ) once every 10 years- not covered by medicare; Zostavax - Shingles vaccine - not covered by Part A or B   Bone mass measurements Date of Study: 06/03/2014        Date of previous bone density study: osteopenia, vitamin D and estrogen deficiency.  A follow up BMD is recommended every 2 years Info:  USPSTF: "B" recommendation to screen, >2 yr interval advised. Coverage: Medicare patients at risk for developing Osteoporosis. Medicare covers every 24 months or based on previous test.   Cardiovascular Screening Blood Tests Lipid Panel     Component Value Date/Time   CHOL 162 07/12/2015 1101   CHOL 173  04/01/2014   TRIG 96.0 07/12/2015 1101   TRIG 90 04/01/2014   HDL 55.20 07/12/2015 1101   CHOLHDL 3 07/12/2015 1101   CHOLHDL 3.3 04/01/2014   VLDL 19.2 07/12/2015 1101   LDLCALC 88 07/12/2015 1101   LDLCALC 52 04/01/2014   Info:  USPSTF: "C" recommendation for no screening unless patient has risk factors, "A" recommendation to screen if has risk factors: HTN, DM, ASCVD, Fam Hx of ASCVD in men <50, women <60, Tobacco use, BMI > 30. USPSTF prefers Total Cholesterol and HDL for screening. USPSTF  advises every 5 years Coverage: All asymptomic Medicare patients (No fast required for total and HDL measurement. 12-hr fast is required if lipid panel done)  Diabetes Screening Tests No results found for: HGBA1C No results for input(s): NA, K, CL, CO2, GLUCOSE, BUN, CREATININE, CALCIUM, MG, PHOS in the last 168 hours. Info USPSTF: "B" recommendation to screen if patient has sustained BP > 135/80; ([employer] covers 2 screening tests per year for patient diagnosed with pre-diabetes; 1 screening per year if previously tested, but not diagnosed with pre-diabetes or if never tested) Coverage: - Medicare patients with certain risk factors for diabetes or diagnosed with pre-diabetes (patients previously diagnosed with diabetes aren't eligible for benefit)  Diabetes Self-Mgmt Training and Medical Nutrition Therapy  Date previously done: None  Info: (Up to 10 hrs of initial training within a continuous 6mo period; subsequent yrs up to 2hrs follow-up training each year after initial year) ;  Coverage: Medicare patients at risk for complications from diabetes, recently diagnosed with diabetes or previously diagnosed with diabetes (must certify DSMT need)   Colorectal Cancer Screening Last Colonoscopy: Colonoscopy schedule in June 2017, with Dr. Ardis Hughs (10 year surveillance)  Done by: Dr. Ardis Hughs Info USPSTF: "A" for CRC screening, ages 27-74;  3 screening methods: USPSTF says no method is preferred - Annual  high-sensitivity FOBT OR - Flex sig Q 5 y + annual FOBT OR - Colonoscopy Q 10 y;  Medicare covers:  -Flexible sigmoidoscopy (74yrs, or once every 10 yrs after a screening colonoscopy) -Screening Colonoscopy (every 5 yrs if at high risk; every 10 yrs otherwise) -Fecal Occult Blood Test (annually) or Cologuard q3 years Coverage: Medicare covers for patients age 37 and up - Screening colonoscopy: For those at high risk; no minimum age  Glaucoma Screening  Date of Last ophthalmology evaluation: 2017 Done by: Dr Katy Fitch  Info USPSTF: "I" recommendation - Insufficent evidence to recommend for or against screening (Medicare coveres annually for patients in a high risk group);  Coverage: Medicare covers for patients with diabetes mellitus, family history of glaucoma, African-Americans age 66 and over, or 72 age 76 and up  Screening Pap tests and Pelvic Exam Last Pap and Pelvic exam: Not indicated by age, Hysterectomy for fibroids and menorrhagia.  info:  USPSTF: "D" recommendation against cervical cancer screening for women > age 36 with adequate screening history not at high risk for cervical cancer.  (Medicare covers annually if high-risk, or childbearing age with abnormal Pap test within past 3 yrs; every 24 months for all other women); Medicare covers for all female Medicare patients  Screening Mammography Completed 04/27/2015, [place], prior at [facility]. All normal mammograms. Mother with history of breast cancer late in life.  Info:  USPSTF "B" recommendation for mammography every 2 years ages 21-74;  (Medicare covers annually); Medicare covers for all female patients 45 or older   AAA/EKG Screening: completed if indicated and medicare welcome visit.   Alcohol abuse screening: completed if indicated  STIs screening: Completed if indicated  Assessment and Plan: BMP diabetes screening/HTN.  CBC anemia screen  Schedule mammogram and DEXA after June 11th Work on  advanced directive and bring a copy once completed.   Education and Counseling Provided:  See after visit summary that was printed and given to patient 1. Tobacco abuse counseling 2. Alcohol abuse counseling 3. STIs counseling 4. Referrals made  Referrals/orders made if indicated: 1. IBT (Intensive Behavior Therapy) for Cardiovascular disease 2. IBT for Obesity 3. MNT (Medical Nutrition Therapy)  4. Behavioral  Counseling for Alcohol abuse  5. HIBT (High Intensity Behavioral Counseling) to prevent STIs (Sexually Transmitted Infections) 6. [place] for AAA screening - IPPE only 7. EKG screening- IPPE only 8. Low dose CT- lung cancer screen 9. Pelvic/PAP exam 10. PSA/Prostate 11. Screen mammogram 12. HIV screen 13. Hep C screen 14. Glaucoma screen 15. Diabetes screen 16. Colon cancer screen 17. DEXA  Vaccinations Administered: 1. Influenza 2. PPV23/prevnar 13 3. Hepatitis B 4. Tdap - print script 5. Shingle's vaccination- print script  Electronically Signed by: Howard Pouch, DO [place]

## 2016-05-09 ENCOUNTER — Other Ambulatory Visit (INDEPENDENT_AMBULATORY_CARE_PROVIDER_SITE_OTHER): Payer: Medicare HMO

## 2016-05-09 DIAGNOSIS — Z1231 Encounter for screening mammogram for malignant neoplasm of breast: Secondary | ICD-10-CM

## 2016-05-09 DIAGNOSIS — E559 Vitamin D deficiency, unspecified: Secondary | ICD-10-CM

## 2016-05-09 DIAGNOSIS — D509 Iron deficiency anemia, unspecified: Secondary | ICD-10-CM | POA: Diagnosis not present

## 2016-05-09 DIAGNOSIS — M8580 Other specified disorders of bone density and structure, unspecified site: Secondary | ICD-10-CM

## 2016-05-09 DIAGNOSIS — Z1239 Encounter for other screening for malignant neoplasm of breast: Secondary | ICD-10-CM

## 2016-05-09 DIAGNOSIS — E2839 Other primary ovarian failure: Secondary | ICD-10-CM

## 2016-05-09 DIAGNOSIS — I10 Essential (primary) hypertension: Secondary | ICD-10-CM

## 2016-05-09 LAB — CBC WITH DIFFERENTIAL/PLATELET
BASOS ABS: 0 10*3/uL (ref 0.0–0.1)
BASOS PCT: 0.4 % (ref 0.0–3.0)
EOS ABS: 0.3 10*3/uL (ref 0.0–0.7)
Eosinophils Relative: 6.3 % — ABNORMAL HIGH (ref 0.0–5.0)
HCT: 37.5 % (ref 36.0–46.0)
HEMOGLOBIN: 12.3 g/dL (ref 12.0–15.0)
Lymphocytes Relative: 45.8 % (ref 12.0–46.0)
Lymphs Abs: 2.5 10*3/uL (ref 0.7–4.0)
MCHC: 32.8 g/dL (ref 30.0–36.0)
MCV: 85.5 fl (ref 78.0–100.0)
MONO ABS: 0.8 10*3/uL (ref 0.1–1.0)
Monocytes Relative: 14.3 % — ABNORMAL HIGH (ref 3.0–12.0)
Neutro Abs: 1.8 10*3/uL (ref 1.4–7.7)
Neutrophils Relative %: 33.2 % — ABNORMAL LOW (ref 43.0–77.0)
Platelets: 277 10*3/uL (ref 150.0–400.0)
RBC: 4.39 Mil/uL (ref 3.87–5.11)
RDW: 15.3 % (ref 11.5–15.5)
WBC: 5.5 10*3/uL (ref 4.0–10.5)

## 2016-05-09 LAB — BASIC METABOLIC PANEL
BUN: 13 mg/dL (ref 6–23)
CO2: 32 mEq/L (ref 19–32)
Calcium: 10.5 mg/dL (ref 8.4–10.5)
Chloride: 102 mEq/L (ref 96–112)
Creatinine, Ser: 0.57 mg/dL (ref 0.40–1.20)
GFR: 135.42 mL/min (ref >60.00)
GLUCOSE: 94 mg/dL (ref 70–99)
POTASSIUM: 4 meq/L (ref 3.5–5.1)
SODIUM: 138 meq/L (ref 135–145)

## 2016-05-09 LAB — VITAMIN D 25 HYDROXY (VIT D DEFICIENCY, FRACTURES): VITD: 25.33 ng/mL — AB (ref 30.00–100.00)

## 2016-05-10 ENCOUNTER — Telehealth: Payer: Self-pay | Admitting: Family Medicine

## 2016-05-10 DIAGNOSIS — E559 Vitamin D deficiency, unspecified: Secondary | ICD-10-CM

## 2016-05-10 MED ORDER — VITAMIN D (ERGOCALCIFEROL) 1.25 MG (50000 UNIT) PO CAPS: 50000.0000 [IU] | ORAL_CAPSULE | ORAL | Status: DC

## 2016-05-10 NOTE — Telephone Encounter (Signed)
Left message for patient to return call to review lab results. 

## 2016-05-10 NOTE — Telephone Encounter (Signed)
Please call pt: - Her labs look good, except her Vitamin D remains low - I have called in a Vit D supplement for her to take once a week for 12 weeks (4 pills x2 refills- medicare) - after the completion of this medication (12 weeks), I will want to retest her levels to make sure they are normal.  - She should also take 1000u by mouth daily OTC vitamin D daily if not already taking (not on medications list). If she reports taking more than this OTC, then continue.   - Vitamin D should be taken with a meal.

## 2016-05-11 NOTE — Telephone Encounter (Signed)
Spoke with patient reviewed results and instructions . Patient verbalized understanding. 

## 2016-05-22 ENCOUNTER — Other Ambulatory Visit: Payer: Self-pay | Admitting: Family Medicine

## 2016-05-24 ENCOUNTER — Telehealth: Payer: Self-pay | Admitting: Gastroenterology

## 2016-05-24 MED ORDER — METOCLOPRAMIDE HCL 5 MG PO TABS: 5.0000 mg | ORAL_TABLET | ORAL | Status: DC

## 2016-05-24 NOTE — Telephone Encounter (Signed)
reglan has been sent to the pharmacy as requested and the pt has been notified that the miralax and dulcolax are OTC. Pt verbalized understanding.

## 2016-05-29 ENCOUNTER — Encounter: Payer: Self-pay | Admitting: Gastroenterology

## 2016-05-29 ENCOUNTER — Ambulatory Visit (AMBULATORY_SURGERY_CENTER): Payer: Medicare HMO | Admitting: Gastroenterology

## 2016-05-29 VITALS — BP 125/76 | HR 68 | Temp 98.2°F | Resp 14 | Ht 63.0 in | Wt 198.0 lb

## 2016-05-29 DIAGNOSIS — D120 Benign neoplasm of cecum: Secondary | ICD-10-CM

## 2016-05-29 DIAGNOSIS — Z1211 Encounter for screening for malignant neoplasm of colon: Secondary | ICD-10-CM

## 2016-05-29 DIAGNOSIS — K635 Polyp of colon: Secondary | ICD-10-CM | POA: Diagnosis not present

## 2016-05-29 DIAGNOSIS — I10 Essential (primary) hypertension: Secondary | ICD-10-CM | POA: Diagnosis not present

## 2016-05-29 DIAGNOSIS — K5732 Diverticulitis of large intestine without perforation or abscess without bleeding: Secondary | ICD-10-CM | POA: Diagnosis not present

## 2016-05-29 MED ORDER — SODIUM CHLORIDE 0.9 % IV SOLN: 500.0000 mL | INTRAVENOUS | Status: DC

## 2016-05-29 NOTE — Progress Notes (Signed)
Called to room to assist during endoscopic procedure.  Patient ID and intended procedure confirmed with present staff. Received instructions for my participation in the procedure from the performing physician.  

## 2016-05-29 NOTE — Patient Instructions (Signed)
Discharge instructions given. Handout on polyps. Resume previous medications. YOU HAD AN ENDOSCOPIC PROCEDURE TODAY AT THE [facility]:   Refer to the procedure report that was given to you for any specific questions about what was found during the examination.  If the procedure report does not answer your questions, please call your gastroenterologist to clarify.  If you requested that your care partner not be given the details of your procedure findings, then the procedure report has been included in a sealed envelope for you to review at your convenience later.  YOU SHOULD EXPECT: Some feelings of bloating in the abdomen. Passage of more gas than usual.  Walking can help get rid of the air that was put into your GI tract during the procedure and reduce the bloating. If you had a lower endoscopy (such as a colonoscopy or flexible sigmoidoscopy) you may notice spotting of blood in your stool or on the toilet paper. If you underwent a bowel prep for your procedure, you may not have a normal bowel movement for a few days.  Please Note:  You might notice some irritation and congestion in your nose or some drainage.  This is from the oxygen used during your procedure.  There is no need for concern and it should clear up in a day or so.  SYMPTOMS TO REPORT IMMEDIATELY:   Following lower endoscopy (colonoscopy or flexible sigmoidoscopy):  Excessive amounts of blood in the stool  Significant tenderness or worsening of abdominal pains  Swelling of the abdomen that is new, acute  Fever of 100F or higher   For urgent or emergent issues, a gastroenterologist can be reached at any hour by calling [phone].   DIET: Your first meal following the procedure should be a small meal and then it is ok to progress to your normal diet. Heavy or fried foods are harder to digest and may make you feel nauseous or bloated.  Likewise, meals heavy in dairy and vegetables can increase bloating.  Drink  plenty of fluids but you should avoid alcoholic beverages for 24 hours.  ACTIVITY:  You should plan to take it easy for the rest of today and you should NOT DRIVE or use heavy machinery until tomorrow (because of the sedation medicines used during the test).    FOLLOW UP: Our staff will call the number listed on your records the next business day following your procedure to check on you and address any questions or concerns that you may have regarding the information given to you following your procedure. If we do not reach you, we will leave a message.  However, if you are feeling well and you are not experiencing any problems, there is no need to return our call.  We will assume that you have returned to your regular daily activities without incident.  If any biopsies were taken you will be contacted by phone or by letter within the next 1-3 weeks.  Please call us at [phone] if you have not heard about the biopsies in 3 weeks.    SIGNATURES/CONFIDENTIALITY: You and/or your care partner have signed paperwork which will be entered into your electronic medical record.  These signatures attest to the fact that that the information above on your After Visit Summary has been reviewed and is understood.  Full responsibility of the confidentiality of this discharge information lies with you and/or your care-partner. 

## 2016-05-29 NOTE — Op Note (Signed)
[facility] Patient Name: Shelly Leonard Procedure Date: 05/29/2016 3:13 PM MRN: [id number] Endoscopist: Milus Banister , MD Age: 70 Referring MD:  Date of Birth: [birth year] Gender: Female Procedure:                Colonoscopy Indications:              Screening for colorectal malignant neoplasm Medicines:                Monitored Anesthesia Care Procedure:                Pre-Anesthesia Assessment:                           - Prior to the procedure, a History and Physical                            was performed, and patient medications and                            allergies were reviewed. The patient's tolerance of                            previous anesthesia was also reviewed. The risks                            and benefits of the procedure and the sedation                            options and risks were discussed with the patient.                            All questions were answered, and informed consent                            was obtained. Prior Anticoagulants: The patient has                            taken no previous anticoagulant or antiplatelet                            agents. ASA Grade Assessment: II - A patient with                            mild systemic disease. After reviewing the risks                            and benefits, the patient was deemed in                            satisfactory condition to undergo the procedure.                           After obtaining informed consent, the colonoscope  was passed under direct vision. Throughout the                            procedure, the patient's blood pressure, pulse, and                            oxygen saturations were monitored continuously. The                            Model CF-HQ190L [id number]) scope was introduced                            through the anus and advanced to the the cecum,                            identified by appendiceal orifice and  ileocecal                            valve. The colonoscopy was performed without                            difficulty. The patient tolerated the procedure                            well. The quality of the bowel preparation was                            good. The ileocecal valve, appendiceal orifice, and                            rectum were photographed. Scope In: 3:26:05 PM Scope Out: 3:37:15 PM Scope Withdrawal Time: 0 hours 9 minutes 12 seconds  Total Procedure Duration: 0 hours 11 minutes 10 seconds  Findings:                 A 3 mm polyp was found in the cecum. The polyp was                            sessile. The polyp was removed with a cold snare.                            Resection and retrieval were complete.                           Many small and large-mouthed diverticula were found                            in the entire colon.                           The exam was otherwise without abnormality on                            direct and retroflexion views. Complications:  No immediate complications. Estimated blood loss:                            None. Estimated Blood Loss:     Estimated blood loss: none. Impression:               - One 3 mm polyp in the cecum, removed with a cold                            snare. Resected and retrieved.                           - Diverticulosis in the entire examined colon.                           - The examination was otherwise normal on direct                            and retroflexion views. Recommendation:           - Patient has a contact number available for                            emergencies. The signs and symptoms of potential                            delayed complications were discussed with the                            patient. Return to normal activities tomorrow.                            Written discharge instructions were provided to the                            patient.                            - Resume previous diet.                           - Continue present medications.                           You will receive a letter within 2-3 weeks with the                            pathology results and my final recommendations.                           If the polyp(s) is proven to be 'pre-cancerous' on                            pathology, you will need repeat colonoscopy in 5  years. If the polyp(s) is NOT 'precancerous' on                            pathology then you should repeat colon cancer                            screening in 10 years with colonoscopy without need                            for colon cancer screening by any method prior to                            then (including stool testing). Milus Banister, MD 05/29/2016 3:40:05 PM This report has been signed electronically.

## 2016-05-29 NOTE — Progress Notes (Signed)
Discharge instructions given. Handout on polyps. Resume previous medications. YOU HAD AN ENDOSCOPIC PROCEDURE TODAY AT THE [facility]:   Refer to the procedure report that was given to you for any specific questions about what was found during the examination.  If the procedure report does not answer your questions, please call your gastroenterologist to clarify.  If you requested that your care partner not be given the details of your procedure findings, then the procedure report has been included in a sealed envelope for you to review at your convenience later.  YOU SHOULD EXPECT: Some feelings of bloating in the abdomen. Passage of more gas than usual.  Walking can help get rid of the air that was put into your GI tract during the procedure and reduce the bloating. If you had a lower endoscopy (such as a colonoscopy or flexible sigmoidoscopy) you may notice spotting of blood in your stool or on the toilet paper. If you underwent a bowel prep for your procedure, you may not have a normal bowel movement for a few days.  Please Note:  You might notice some irritation and congestion in your nose or some drainage.  This is from the oxygen used during your procedure.  There is no need for concern and it should clear up in a day or so.  SYMPTOMS TO REPORT IMMEDIATELY:   Following lower endoscopy (colonoscopy or flexible sigmoidoscopy):  Excessive amounts of blood in the stool  Significant tenderness or worsening of abdominal pains  Swelling of the abdomen that is new, acute  Fever of 100F or higher   For urgent or emergent issues, a gastroenterologist can be reached at any hour by calling [phone].   DIET: Your first meal following the procedure should be a small meal and then it is ok to progress to your normal diet. Heavy or fried foods are harder to digest and may make you feel nauseous or bloated.  Likewise, meals heavy in dairy and vegetables can increase bloating.  Drink  plenty of fluids but you should avoid alcoholic beverages for 24 hours.  ACTIVITY:  You should plan to take it easy for the rest of today and you should NOT DRIVE or use heavy machinery until tomorrow (because of the sedation medicines used during the test).    FOLLOW UP: Our staff will call the number listed on your records the next business day following your procedure to check on you and address any questions or concerns that you may have regarding the information given to you following your procedure. If we do not reach you, we will leave a message.  However, if you are feeling well and you are not experiencing any problems, there is no need to return our call.  We will assume that you have returned to your regular daily activities without incident.  If any biopsies were taken you will be contacted by phone or by letter within the next 1-3 weeks.  Please call us at [phone] if you have not heard about the biopsies in 3 weeks.    SIGNATURES/CONFIDENTIALITY: You and/or your care partner have signed paperwork which will be entered into your electronic medical record.  These signatures attest to the fact that that the information above on your After Visit Summary has been reviewed and is understood.  Full responsibility of the confidentiality of this discharge information lies with you and/or your care-partner. 

## 2016-05-29 NOTE — Progress Notes (Signed)
Patient awakening, vss report to rn 

## 2016-05-30 ENCOUNTER — Telehealth: Payer: Self-pay | Admitting: *Deleted

## 2016-05-30 ENCOUNTER — Encounter: Payer: Self-pay | Admitting: Family Medicine

## 2016-05-30 DIAGNOSIS — Z86010 Personal history of colonic polyps: Secondary | ICD-10-CM | POA: Insufficient documentation

## 2016-05-30 NOTE — Telephone Encounter (Signed)
  Follow up Call-  Call back number 05/29/2016  Post procedure Call Back phone  # [phone]  Permission to leave phone message Yes     Patient questions:  Do you have a fever, pain , or abdominal swelling? No. Pain Score  0 *  Have you tolerated food without any problems? Yes.    Have you been able to return to your normal activities? Yes.    Do you have any questions about your discharge instructions: Diet   No. Medications  No. Follow up visit  No.  Do you have questions or concerns about your Care? No.  Actions: * If pain score is 4 or above: No action needed, pain <4.

## 2016-06-01 ENCOUNTER — Telehealth: Payer: Self-pay | Admitting: Gastroenterology

## 2016-06-01 NOTE — Telephone Encounter (Signed)
Left a message for patient to call back. 

## 2016-06-04 NOTE — Telephone Encounter (Signed)
Left message with family member to return call.

## 2016-06-04 NOTE — Telephone Encounter (Signed)
Pt states she is feeling better and will wait for the path results.

## 2016-06-05 ENCOUNTER — Encounter: Payer: Self-pay | Admitting: Gastroenterology

## 2016-06-14 ENCOUNTER — Ambulatory Visit: Payer: Self-pay

## 2016-06-14 ENCOUNTER — Other Ambulatory Visit: Payer: Self-pay

## 2016-06-14 ENCOUNTER — Ambulatory Visit
Admission: RE | Admit: 2016-06-14 | Discharge: 2016-06-14 | Disposition: A | Discharge status: Home / Self Care | Payer: Medicare HMO | Source: Ambulatory Visit | Attending: Family Medicine | Admitting: Family Medicine

## 2016-06-14 DIAGNOSIS — D509 Iron deficiency anemia, unspecified: Secondary | ICD-10-CM

## 2016-06-14 DIAGNOSIS — E2839 Other primary ovarian failure: Secondary | ICD-10-CM

## 2016-06-14 DIAGNOSIS — I10 Essential (primary) hypertension: Secondary | ICD-10-CM

## 2016-06-14 DIAGNOSIS — E559 Vitamin D deficiency, unspecified: Secondary | ICD-10-CM

## 2016-06-14 DIAGNOSIS — M8580 Other specified disorders of bone density and structure, unspecified site: Secondary | ICD-10-CM

## 2016-06-14 DIAGNOSIS — M8589 Other specified disorders of bone density and structure, multiple sites: Secondary | ICD-10-CM | POA: Diagnosis not present

## 2016-06-14 DIAGNOSIS — Z1239 Encounter for other screening for malignant neoplasm of breast: Secondary | ICD-10-CM

## 2016-06-14 DIAGNOSIS — Z780 Asymptomatic menopausal state: Secondary | ICD-10-CM | POA: Diagnosis not present

## 2016-06-14 DIAGNOSIS — Z1231 Encounter for screening mammogram for malignant neoplasm of breast: Secondary | ICD-10-CM | POA: Diagnosis not present

## 2016-06-18 ENCOUNTER — Telehealth: Payer: Self-pay | Admitting: Family Medicine

## 2016-06-18 NOTE — Telephone Encounter (Signed)
Left message for patient to return call.

## 2016-06-18 NOTE — Telephone Encounter (Addendum)
Please call pt: - She has some bone softening (osteopenia) by her DEXA scan. This is consistent with reading in 2015.  - She should continue her vitamin D. I would recommend a daily dose, amount dependent on how much she was actually taking of the weekly prescribed (she was not routinely taking). I would suggest starting about 1000 u daily. She will should also be encouraged to take 1000-1200 mg of calcium.

## 2016-06-20 NOTE — Telephone Encounter (Signed)
Left message with Dexa scan results and instructions for Vit D and Calcium supplementation.

## 2016-06-22 NOTE — Telephone Encounter (Signed)
Patient states she is taking 1000 Units daily plus 50,000 Units once weekly.  Patient was not taking calcium.  I advised her to take 1000 / 1200 MG daily.  Patient understood.

## 2016-07-06 ENCOUNTER — Telehealth: Payer: Self-pay | Admitting: Family Medicine

## 2016-07-06 ENCOUNTER — Encounter: Payer: Self-pay | Admitting: Family Medicine

## 2016-07-06 ENCOUNTER — Ambulatory Visit (INDEPENDENT_AMBULATORY_CARE_PROVIDER_SITE_OTHER): Payer: Medicare HMO | Admitting: Family Medicine

## 2016-07-06 VITALS — BP 113/75 | HR 65 | Temp 98.6°F | Resp 20 | Wt 189.1 lb

## 2016-07-06 DIAGNOSIS — M8580 Other specified disorders of bone density and structure, unspecified site: Secondary | ICD-10-CM

## 2016-07-06 DIAGNOSIS — Z1239 Encounter for other screening for malignant neoplasm of breast: Secondary | ICD-10-CM

## 2016-07-06 DIAGNOSIS — E559 Vitamin D deficiency, unspecified: Secondary | ICD-10-CM | POA: Diagnosis not present

## 2016-07-06 DIAGNOSIS — Z1231 Encounter for screening mammogram for malignant neoplasm of breast: Secondary | ICD-10-CM

## 2016-07-06 LAB — VITAMIN D 25 HYDROXY (VIT D DEFICIENCY, FRACTURES): VITD: 45.46 ng/mL (ref 30.00–100.00)

## 2016-07-06 NOTE — Progress Notes (Signed)
Patient ID: Shelly Leonard, female   DOB: [birth year], 70 y.o.   MRN: [id number]    Shelly Leonard , Mar 21, 1946, 70 y.o., female MRN: [id number] Patient Care Team    Relationship Specialty Notifications Start End  [name], DO PCP - General Family Medicine  08/18/15   Warden Fillers, MD Consulting Physician Ophthalmology  05/08/16   Milus Banister, MD Attending Physician Gastroenterology  05/08/16     CC: vit D deficiency Subjective:  Vitamin D deficiency/osteopenia: Pt presents to discuss DEXA scan results and be rechecked for her low vitamin D. She has been taking the 50000 u D as prescribed and has 1 pill left. She has also been taking calcium 1200 mg a day for her osteopenia.   No Known Allergies Social History  Substance Use Topics  . Smoking status: Never Smoker   . Smokeless tobacco: Never Used  . Alcohol Use: No   Past Medical History  Diagnosis Date  . PONV (postoperative nausea and vomiting)   . Hypertension   . Anemia     low iron (hgb 8)  . GERD (gastroesophageal reflux disease)   . Headache(784.0)     occasional   . Arthritis     knees and hands   . Vertigo   . Vitamin D deficiency   . Osteopenia   . Carpal tunnel syndrome   . Diverticulitis large intestine w/o perforation or abscess w/bleeding   . Allergy   . Cataract    Past Surgical History  Procedure Laterality Date  . Knee arthroscopy      right knee 2012   . Carpal tunnel release      left   . Other surgical history      spur removed top of left foot   . Breast reduction surgery  1982  . Total knee arthroplasty  04/21/2012    Procedure: TOTAL KNEE ARTHROPLASTY;  Surgeon: [name], MD;  Location: [facility];  Service: Orthopedics;  Laterality: Right;  . Vaginal hysterectomy  1977  . Foot surgery    . Colonoscopy     Family History  Problem Relation Age of Onset  . Breast cancer Mother     late in life  . Diabetes Mother     late in life  . Heart disease Father   . Lung  disease Father   . Colon cancer Neg Hx   . Esophageal cancer Neg Hx   . Stomach cancer Neg Hx   . Rectal cancer Neg Hx      Medication List       This list is accurate as of: 07/06/16  1:26 PM.  Always use your most recent med list.               acetaminophen 500 MG tablet  Commonly known as:  TYLENOL  Take 1,000 mg by mouth every 6 (six) hours as needed for mild pain or headache.     Blood Pressure Monitor/L Cuff Misc  Please supply automatic BP cuff/device (please fit)     ciclopirox 0.77 % cream  Commonly known as:  LOPROX  APPLY  CREAM TOPICALLY TO AFFECTED AREA TWICE DAILY     fluticasone 50 MCG/ACT nasal spray  Commonly known as:  FLONASE  Place 2 sprays into both nostrils daily.     Garlic 123XX123 MG Tabs  Take 100 mg by mouth daily with breakfast.     HYDROcodone-acetaminophen 5-325 MG tablet  Commonly known as:  NORCO  Take 1 tablet by mouth every 8 (eight) hours as needed for moderate pain or severe pain.     multivitamin with minerals Tabs tablet  Take 1 tablet by mouth daily with breakfast.     OMEGA 3 PO  Take 1 capsule by mouth daily.     pantoprazole 40 MG tablet  Commonly known as:  PROTONIX  Take 40 mg by mouth daily as needed.     triamterene-hydrochlorothiazide 75-50 MG tablet  Commonly known as:  MAXZIDE  TAKE ONE TABLET BY MOUTH ONCE DAILY WITH  BREAKFAST     verapamil 120 MG tablet  Commonly known as:  CALAN  Take 1 tablet (120 mg total) by mouth once.     Vitamin D (Ergocalciferol) 50000 units Caps capsule  Commonly known as:  DRISDOL  Take 1 capsule (50,000 Units total) by mouth every 7 (seven) days.        Dg Bone Density  06/18/2016  EXAM: DUAL X-RAY ABSORPTIOMETRY (DXA) FOR BONE MINERAL DENSITY IMPRESSION: Referring Physician:  Yalda Herd A Aliyyah Riese PATIENT: Name: Shelly, Leonard Patient ID: [id number] Birth Date: 11/17/1946 Height: 62.5 in. Sex: Female Measured: 06/14/2016 Weight: 184.0 lbs. Indications: Estrogen Deficient, Height Loss  (781.91), Hysterectomy, Low Calcium Intake (269.3), Postmenopausal Fractures: None Treatments: Vitamin D (E933.5) ASSESSMENT: The BMD measured at Femur Neck Right is 0.827 g/cm2 with a T-score of -1.5. This patient is considered osteopenic according to [facility]) criteria. Lumbar spine was not utilized due to advanced degenerative changes. Per the official positions of the ISCD, it is not possible to quantitatively compare BMD or calculate an [facility] between exams done at different facilities. Site Region Measured Date Measured Age YA BMD Significant CHANGE T-score DualFemur Neck Right 06/14/2016 68.6 -1.5 0.827 g/cm2 Left Forearm Radius 33% 06/14/2016 68.6 -1.5 0.755 g/cm2 World Health Organization [facility]) criteria for post-menopausal, Caucasian Women: Normal       T-score at or above -1 SD Osteopenia   T-score between -1 and -2.5 SD Osteoporosis T-score at or below -2.5 SD RECOMMENDATION: [place] recommends that FDA-approved medical therapies be considered in postmenopausal women and men age 92 or older with a: 1. Hip or vertebral (clinical or morphometric) fracture. 2. T-score of <-2.5 at the spine or hip. 3. Ten-year fracture probability by FRAX of 3% or greater for hip fracture or 20% or greater for major osteoporotic fracture. All treatment decisions require clinical judgment and consideration of individual patient factors, including patient preferences, co-morbidities, previous drug use, risk factors not captured in the FRAX model (e.g. falls, vitamin D deficiency, increased bone turnover, interval significant decline in bone density) and possible under - or over-estimation of fracture risk by FRAX. All patients should ensure an adequate intake of dietary calcium (1200 mg/d) and vitamin D (800 IU daily) unless contraindicated. FOLLOW-UP: People with diagnosed cases of osteoporosis or at high risk for fracture should have regular bone mineral density tests. For patients  eligible for Medicare, routine testing is allowed once every 2 years. The testing frequency can be increased to one year for patients who have rapidly progressing disease, those who are receiving or discontinuing medical therapy to restore bone mass, or have additional risk factors. I have reviewed this report, and agree with the above findings. [facility] FRAX* 10-year Probability of Fracture Based on femoral neck BMD: DualFemur (Right) Major Osteoporotic Fracture: 4.1% Hip Fracture:                0.5% Population:  [place] (Black) Risk Factors:                None *FRAX is a Materials engineer of the [employer] for Metabolic Bone Disease, a [place] (WHO) [facility]. ASSESSMENT: The probability of a major osteoporotic fracture is 4.1 % withinthe next ten years. The probability of a hip fracture is 0.5 % within the next ten tears. Electronically Signed   By: David  Martinique M.D.   On: 06/18/2016 07:48   Mm Digital Screening Bilateral  06/18/2016  CLINICAL DATA:  Screening. History of bilateral breast productive mammoplasty in 1982. EXAM: DIGITAL SCREENING BILATERAL MAMMOGRAM WITH CAD COMPARISON:  Previous exam(s). ACR Breast Density Category b: There are scattered areas of fibroglandular density. FINDINGS: There are no findings suspicious for malignancy. Images were processed with CAD. IMPRESSION: No mammographic evidence of malignancy. A result letter of this screening mammogram will be mailed directly to the patient. RECOMMENDATION: Screening mammogram in one year. (Code:SM-B-01Y) BI-RADS CATEGORY  1: Negative. Electronically Signed   By: Fidela Salisbury M.D.   On: 06/18/2016 08:03   Recent Results (from the past 2160 hour(s))  CBC w/Diff     Status: Abnormal   Collection Time: 05/09/16 11:26 AM  Result Value Ref Range   WBC 5.5 4.0 - 10.5 K/uL   RBC 4.39 3.87 - 5.11 Mil/uL   Hemoglobin 12.3 12.0 - 15.0 g/dL   HCT 37.5 36.0  - 46.0 %   MCV 85.5 78.0 - 100.0 fl   MCHC 32.8 30.0 - 36.0 g/dL   RDW 15.3 11.5 - 15.5 %   Platelets 277.0 150.0 - 400.0 K/uL   Neutrophils Relative % 33.2 (L) 43.0 - 77.0 %   Lymphocytes Relative 45.8 12.0 - 46.0 %   Monocytes Relative 14.3 (H) 3.0 - 12.0 %   Eosinophils Relative 6.3 (H) 0.0 - 5.0 %   Basophils Relative 0.4 0.0 - 3.0 %   Neutro Abs 1.8 1.4 - 7.7 K/uL   Lymphs Abs 2.5 0.7 - 4.0 K/uL   Monocytes Absolute 0.8 0.1 - 1.0 K/uL   Eosinophils Absolute 0.3 0.0 - 0.7 K/uL   Basophils Absolute 0.0 0.0 - 0.1 K/uL  Basic Metabolic Panel (BMET)     Status: None   Collection Time: 05/09/16 11:26 AM  Result Value Ref Range   Sodium 138 135 - 145 mEq/L   Potassium 4.0 3.5 - 5.1 mEq/L   Chloride 102 96 - 112 mEq/L   CO2 32 19 - 32 mEq/L   Glucose, Bld 94 70 - 99 mg/dL   BUN 13 6 - 23 mg/dL   Creatinine, Ser 0.57 0.40 - 1.20 mg/dL   Calcium 10.5 8.4 - 10.5 mg/dL   GFR 135.42 >60.00 mL/min  VITAMIN D 25 Hydroxy (Vit-D Deficiency, Fractures)     Status: Abnormal   Collection Time: 05/09/16 11:26 AM  Result Value Ref Range   VITD 25.33 (L) 30.00 - 100.00 ng/mL     ROS: Negative, with the exception of above mentioned in HPI   Objective:  BP 113/75 mmHg  Pulse 65  Temp(Src) 98.6 F (37 C)  Resp 20  Wt 189 lb 1.9 oz (85.784 kg)  SpO2 97% Body mass index is 33.51 kg/(m^2). Gen: Afebrile. No acute distress. Nontoxic in appearance, well developed, well nourished.  HENT: AT. [place].MMM, no oral lesions.  Eyes:Pupils Equal Round Reactive to light, Extraocular movements intact,  Conjunctiva without redness, discharge or icterus. Neuro: Normal gait. PERLA. EOMi. Alert.  Oriented x3  Psych: Normal affect, dress and demeanor. Normal speech. Normal thought content and judgment.  Assessment/Plan: ARLYCE WOLLARD is a 70 y.o. female present for acute OV for  Osteopenia/Vitamin D deficiency - Vit D recheck today - taking 1200 mg ca and 1000 Vit d, +50000 daily.  Breast cancer  screening, high risk patient Reviewed results - birads 1 - continue yearly screening as desired.    electronically signed by:  Howard Pouch, DO  [facility]

## 2016-07-06 NOTE — Patient Instructions (Signed)
It was a pleasure seeing you today.  I will call you with the results and guidance on vit d dose. Until then continue the supplements you are on.

## 2016-07-06 NOTE — Telephone Encounter (Signed)
Please call pt: - her vitamin d looks great. Continue the daily dose she is currently on. She can finish the weekly as well.

## 2016-07-09 NOTE — Telephone Encounter (Signed)
Left message with results and instructions on patient voice mail per DPR 

## 2016-07-11 ENCOUNTER — Other Ambulatory Visit: Payer: Self-pay | Admitting: *Deleted

## 2016-07-11 MED ORDER — VERAPAMIL HCL 120 MG PO TABS: 120.0000 mg | ORAL_TABLET | Freq: Once | ORAL | Status: DC

## 2016-07-11 NOTE — Telephone Encounter (Signed)
Verapamil refilled

## 2016-07-20 ENCOUNTER — Other Ambulatory Visit: Payer: Self-pay | Admitting: Family Medicine

## 2016-07-23 DIAGNOSIS — H43812 Vitreous degeneration, left eye: Secondary | ICD-10-CM | POA: Diagnosis not present

## 2016-07-23 DIAGNOSIS — H16223 Keratoconjunctivitis sicca, not specified as Sjogren's, bilateral: Secondary | ICD-10-CM | POA: Diagnosis not present

## 2016-07-23 DIAGNOSIS — H10413 Chronic giant papillary conjunctivitis, bilateral: Secondary | ICD-10-CM | POA: Diagnosis not present

## 2016-07-23 DIAGNOSIS — H43811 Vitreous degeneration, right eye: Secondary | ICD-10-CM | POA: Diagnosis not present

## 2016-07-23 DIAGNOSIS — H2513 Age-related nuclear cataract, bilateral: Secondary | ICD-10-CM | POA: Diagnosis not present

## 2016-07-23 DIAGNOSIS — H0289 Other specified disorders of eyelid: Secondary | ICD-10-CM | POA: Diagnosis not present

## 2016-08-09 ENCOUNTER — Encounter (HOSPITAL_BASED_OUTPATIENT_CLINIC_OR_DEPARTMENT_OTHER): Payer: Self-pay | Admitting: *Deleted

## 2016-08-09 ENCOUNTER — Emergency Department (HOSPITAL_BASED_OUTPATIENT_CLINIC_OR_DEPARTMENT_OTHER)
Admission: EM | Admit: 2016-08-09 | Discharge: 2016-08-09 | Disposition: A | Discharge status: Home / Self Care | Payer: No Typology Code available for payment source | Attending: Emergency Medicine | Admitting: Emergency Medicine

## 2016-08-09 DIAGNOSIS — Y9389 Activity, other specified: Secondary | ICD-10-CM | POA: Insufficient documentation

## 2016-08-09 DIAGNOSIS — Y999 Unspecified external cause status: Secondary | ICD-10-CM | POA: Insufficient documentation

## 2016-08-09 DIAGNOSIS — S161XXA Strain of muscle, fascia and tendon at neck level, initial encounter: Secondary | ICD-10-CM | POA: Insufficient documentation

## 2016-08-09 DIAGNOSIS — M79602 Pain in left arm: Secondary | ICD-10-CM | POA: Diagnosis not present

## 2016-08-09 DIAGNOSIS — M79622 Pain in left upper arm: Secondary | ICD-10-CM | POA: Diagnosis not present

## 2016-08-09 DIAGNOSIS — Y92410 Unspecified street and highway as the place of occurrence of the external cause: Secondary | ICD-10-CM | POA: Insufficient documentation

## 2016-08-09 DIAGNOSIS — S199XXA Unspecified injury of neck, initial encounter: Secondary | ICD-10-CM | POA: Diagnosis present

## 2016-08-09 DIAGNOSIS — M25512 Pain in left shoulder: Secondary | ICD-10-CM | POA: Diagnosis not present

## 2016-08-09 MED ORDER — TRAMADOL HCL 50 MG PO TABS: 50.0000 mg | ORAL_TABLET | Freq: Four times a day (QID) | ORAL | 0 refills | Status: DC | PRN

## 2016-08-09 MED ORDER — IBUPROFEN 800 MG PO TABS: 800.0000 mg | ORAL_TABLET | Freq: Three times a day (TID) | ORAL | 0 refills | Status: DC

## 2016-08-09 MED ORDER — CYCLOBENZAPRINE HCL 5 MG PO TABS: 5.0000 mg | ORAL_TABLET | Freq: Two times a day (BID) | ORAL | 0 refills | Status: DC | PRN

## 2016-08-09 NOTE — ED Notes (Signed)
PA at bedside to evaluate pt at this time.

## 2016-08-09 NOTE — ED Notes (Signed)
Pt verbalizes understanding of d/c instructions and denies any further needs at this time. 

## 2016-08-09 NOTE — ED Notes (Signed)
Pt up at desk asking when she is going to be seen.  She is informed that the PA has signed up for her and should be in shortly to come and see her.

## 2016-08-09 NOTE — ED Notes (Signed)
Pt is waiting to be seen by Dr. Rogene Houston before she can leave, she has expressed that she is ready to go home at this time.
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
## 2016-08-09 NOTE — ED Notes (Signed)
Pt requesting wait time left for her and her husband to be seen, pt states she is leaving to get food and will be back instructed if she left and returned she would have to check back in and start again
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
## 2016-08-21 ENCOUNTER — Ambulatory Visit (INDEPENDENT_AMBULATORY_CARE_PROVIDER_SITE_OTHER): Payer: Medicare HMO | Admitting: Family Medicine

## 2016-08-21 ENCOUNTER — Encounter: Payer: Self-pay | Admitting: Family Medicine

## 2016-08-21 VITALS — BP 116/76 | HR 67 | Temp 98.1°F | Resp 20 | Ht 62.0 in | Wt 193.0 lb

## 2016-08-21 DIAGNOSIS — T148XXA Other injury of unspecified body region, initial encounter: Secondary | ICD-10-CM

## 2016-08-21 DIAGNOSIS — S46812A Strain of other muscles, fascia and tendons at shoulder and upper arm level, left arm, initial encounter: Secondary | ICD-10-CM | POA: Diagnosis not present
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
## 2016-09-25 DIAGNOSIS — H10413 Chronic giant papillary conjunctivitis, bilateral: Secondary | ICD-10-CM | POA: Diagnosis not present

## 2016-09-25 DIAGNOSIS — H43812 Vitreous degeneration, left eye: Secondary | ICD-10-CM | POA: Diagnosis not present

## 2016-09-25 DIAGNOSIS — H2513 Age-related nuclear cataract, bilateral: Secondary | ICD-10-CM | POA: Diagnosis not present

## 2016-09-25 DIAGNOSIS — H0289 Other specified disorders of eyelid: Secondary | ICD-10-CM | POA: Diagnosis not present

## 2016-09-25 DIAGNOSIS — H16223 Keratoconjunctivitis sicca, not specified as Sjogren's, bilateral: Secondary | ICD-10-CM | POA: Diagnosis not present

## 2016-09-25 DIAGNOSIS — H43811 Vitreous degeneration, right eye: Secondary | ICD-10-CM | POA: Diagnosis not present

## 2016-10-09 ENCOUNTER — Ambulatory Visit (INDEPENDENT_AMBULATORY_CARE_PROVIDER_SITE_OTHER): Payer: Medicare HMO | Admitting: Family Medicine

## 2016-10-09 ENCOUNTER — Encounter: Payer: Self-pay | Admitting: Family Medicine

## 2016-10-09 VITALS — BP 115/74 | HR 84 | Temp 98.5°F | Resp 20 | Wt 190.8 lb

## 2016-10-09 DIAGNOSIS — M25512 Pain in left shoulder: Secondary | ICD-10-CM

## 2016-10-09 DIAGNOSIS — G8929 Other chronic pain: Secondary | ICD-10-CM
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
## 2016-10-09 NOTE — Patient Instructions (Signed)
Sorry your shoulder is hurting you again.  I have placed a referral to [facility]. This is the same place you went for your knee.  Use muscle relaxer and tramadol.

## 2016-10-10 ENCOUNTER — Telehealth: Payer: Self-pay | Admitting: *Deleted

## 2016-10-10 MED ORDER — PREDNISONE 20 MG PO TABS: ORAL_TABLET | ORAL | 0 refills | Status: DC

## 2016-10-10 NOTE — Telephone Encounter (Signed)
Patient called and left message she states she needs prednisone for her shoulder. She states she needs to be able to drive and cant use flexeril or tramadol when driving. She states she cant get in to see Ortho until first week in November. She is requesting we call in prednisone today. Please advise

## 2016-10-10 NOTE — Telephone Encounter (Signed)
Left message for patient with information on voice mail.

## 2016-10-10 NOTE — Telephone Encounter (Signed)
We can try the prednisone orally since she will not get into ortho until next month. I cannot guarantee it will be affective if the shoulder/rotator cuff is the actual issue.  This has been called in for her .

## 2016-10-23 ENCOUNTER — Ambulatory Visit (INDEPENDENT_AMBULATORY_CARE_PROVIDER_SITE_OTHER): Payer: Medicare HMO | Admitting: Family Medicine

## 2016-10-23 ENCOUNTER — Encounter: Payer: Self-pay | Admitting: Family Medicine

## 2016-10-23 VITALS — BP 132/93 | HR 90 | Temp 98.0°F | Resp 20 | Wt 188.5 lb

## 2016-10-23 DIAGNOSIS — K219 Gastro-esophageal reflux disease without esophagitis: Secondary | ICD-10-CM

## 2016-10-23 DIAGNOSIS — F419 Anxiety disorder, unspecified: Secondary | ICD-10-CM | POA: Diagnosis not present

## 2016-10-23 NOTE — Patient Instructions (Signed)
-   decrease/avoid caffeine.  - avoid steroid use - relaxation- breathing exercises when feeling anxious.  - protonix daily for 2-4 weeks  If symptoms are not resolved in about 2 weeks, please be seen to discuss. I think these symptoms are from the prednisone use the last 10 days.   If you get chest pain or emergent symptoms discussed today with palpitations, be seen immediately.     Palpitations A palpitation is the feeling that your heartbeat is irregular or is faster than normal. It may feel like your heart is fluttering or skipping a beat. Palpitations are usually not a serious problem. However, in some cases, you may need further medical evaluation. CAUSES  Palpitations can be caused by:  Smoking.  Caffeine or other stimulants, such as diet pills or energy drinks.  Alcohol.  Stress and anxiety.  Strenuous physical activity.  Fatigue.  Certain medicines.  Heart disease, especially if you have a history of irregular heart rhythms (arrhythmias), such as atrial fibrillation, atrial flutter, or supraventricular tachycardia.  An improperly working pacemaker or defibrillator. DIAGNOSIS  To find the cause of your palpitations, your health care provider will take your medical history and perform a physical exam. Your health care provider may also have you take a test called an ambulatory electrocardiogram (ECG). An ECG records your heartbeat patterns over a 24-hour period. You may also have other tests, such as:  Transthoracic echocardiogram (TTE). During echocardiography, sound waves are used to evaluate how blood flows through your heart.  Transesophageal echocardiogram (TEE).  Cardiac monitoring. This allows your health care provider to monitor your heart rate and rhythm in real time.  Holter monitor. This is a portable device that records your heartbeat and can help diagnose heart arrhythmias. It allows your health care provider to track your heart activity for several days,  if needed.  Stress tests by exercise or by giving medicine that makes the heart beat faster. TREATMENT  Treatment of palpitations depends on the cause of your symptoms and can vary greatly. Most cases of palpitations do not require any treatment other than time, relaxation, and monitoring your symptoms. Other causes, such as atrial fibrillation, atrial flutter, or supraventricular tachycardia, usually require further treatment. HOME CARE INSTRUCTIONS   Avoid:  Caffeinated coffee, tea, soft drinks, diet pills, and energy drinks.  Chocolate.  Alcohol.  Stop smoking if you smoke.  Reduce your stress and anxiety. Things that can help you relax include:  A method of controlling things in your body, such as your heartbeats, with your mind (biofeedback).  Yoga.  Meditation.  Physical activity such as swimming, jogging, or walking.  Get plenty of rest and sleep. SEEK MEDICAL CARE IF:   You continue to have a fast or irregular heartbeat beyond 24 hours.  Your palpitations occur more often. SEEK IMMEDIATE MEDICAL CARE IF:  You have chest pain or shortness of breath.  You have a severe headache.  You feel dizzy or you faint. MAKE SURE YOU:  Understand these instructions.  Will watch your condition.  Will get help right away if you are not doing well or get worse.   This information is not intended to replace advice given to you by your health care provider. Make sure you discuss any questions you have with your health care provider.   Document Released: 12/07/2000 Document Revised: 12/15/2013 Document Reviewed: 02/08/2012 Elsevier Interactive Patient Education [employer].

## 2016-10-23 NOTE — Progress Notes (Signed)
Shelly Leonard , 28-Feb-1946, 70 y.o., female MRN: [id number] Patient Care Team    Relationship Specialty Notifications Start End  [name], DO PCP - General Family Medicine  08/18/15   Warden Fillers, MD Consulting Physician Ophthalmology  05/08/16   Milus Banister, MD Attending Physician Gastroenterology  05/08/16     CC: "not feeling well"  Subjective:  Pt presents for an acute OV with complaints of anxiousiness, "not feeling Well" and heartburn. She states for the last week she has been feeling anxious, and she has never been an anxious person. She is having trouble sleeping as well. Moments of "heart racing" that last only a few seconds.  She also noticed she has had more heartburn since she ate greek food a few days ago. She has not been taking her PPI. She deies chest pain , diaphoresis or panic attack.She has just recently finished a taper of steroids for her shoulder pain. She denies recent stress in her life. . No Known Allergies Social History  Substance Use Topics  . Smoking status: Never Smoker  . Smokeless tobacco: Never Used  . Alcohol use No   Past Medical History:  Diagnosis Date  . Allergy   . Anemia    low iron (hgb 8)  . Arthritis    knees and hands   . Carpal tunnel syndrome   . Cataract   . Diverticulitis large intestine w/o perforation or abscess w/bleeding   . GERD (gastroesophageal reflux disease)   . Headache(784.0)    occasional   . Hypertension   . Osteopenia   . PONV (postoperative nausea and vomiting)   . Vertigo   . Vitamin D deficiency    Past Surgical History:  Procedure Laterality Date  . BREAST REDUCTION SURGERY  1982  . CARPAL TUNNEL RELEASE     left   . COLONOSCOPY    . FOOT SURGERY    . KNEE ARTHROSCOPY     right knee 2012   . OTHER SURGICAL HISTORY     spur removed top of left foot   . TOTAL KNEE ARTHROPLASTY  04/21/2012   Procedure: TOTAL KNEE ARTHROPLASTY;  Surgeon: [name], MD;  Location: [facility];  Service:  Orthopedics;  Laterality: Right;  [name] Kitchen VAGINAL HYSTERECTOMY  1977   Family History  Problem Relation Age of Onset  . Breast cancer Mother     late in life  . Diabetes Mother     late in life  . Heart disease Father   . Lung disease Father   . Colon cancer Neg Hx   . Esophageal cancer Neg Hx   . Stomach cancer Neg Hx   . Rectal cancer Neg Hx      Medication List       Accurate as of 10/23/16  2:14 PM. Always use your most recent med list.          acetaminophen 500 MG tablet Commonly known as:  TYLENOL Take 1,000 mg by mouth every 6 (six) hours as needed for mild pain or headache.   Blood Pressure Monitor/L Cuff Misc Please supply automatic BP cuff/device (please fit)   ciclopirox 0.77 % cream Commonly known as:  LOPROX APPLY  CREAM TOPICALLY TO AFFECTED AREA TWICE DAILY   cyclobenzaprine 5 MG tablet Commonly known as:  FLEXERIL Take 1 tablet (5 mg total) by mouth 2 (two) times daily as needed for muscle spasms.   fluticasone 50 MCG/ACT nasal spray Commonly known as:  FLONASE Place 2 sprays into both nostrils daily.   Garlic 123XX123 MG Tabs Take 100 mg by mouth daily with breakfast.   ibuprofen 800 MG tablet Commonly known as:  ADVIL,MOTRIN Take 1 tablet (800 mg total) by mouth 3 (three) times daily.   multivitamin with minerals Tabs tablet Take 1 tablet by mouth daily with breakfast.   OMEGA 3 PO Take 1 capsule by mouth daily.   pantoprazole 40 MG tablet Commonly known as:  PROTONIX Take 40 mg by mouth daily as needed.   traMADol 50 MG tablet Commonly known as:  ULTRAM Take 1 tablet (50 mg total) by mouth every 6 (six) hours as needed.   triamterene-hydrochlorothiazide 75-50 MG tablet Commonly known as:  MAXZIDE TAKE ONE TABLET BY MOUTH ONCE DAILY WITH  BREAKFAST   verapamil 120 MG tablet Commonly known as:  CALAN Take 1 tablet (120 mg total) by mouth once.   Vitamin D (Ergocalciferol) 50000 units Caps capsule Commonly known as:  DRISDOL Take 1  capsule (50,000 Units total) by mouth every 7 (seven) days.       No results found for this or any previous visit (from the past 24 hour(s)). No results found.   ROS: Negative, with the exception of above mentioned in HPI   Objective:  BP (!) 132/93 (BP Location: Right Arm, Patient Position: Sitting, Cuff Size: Large)   Pulse 90   Temp 98 F (36.7 C)   Resp 20   Wt 188 lb 8 oz (85.5 kg)   SpO2 97%   BMI 34.48 kg/m  Body mass index is 34.48 kg/m. Gen: Afebrile. No acute distress. Nontoxic in appearance, well developed, well nourished. AAF. HENT: AT. [place].  MMM Eyes:Pupils Equal Round Reactive to light, Extraocular movements intact,  Conjunctiva without redness, discharge or icterus. CV: RRR, no edema Chest: CTAB, no wheeze or crackles. Good air movement, normal resp effort.  Abd: Soft. obese. NTND. BS present.  Skin: no rashes, purpura or petechiae.  Neuro: Normal gait. PERLA. EOMi. Alert. Oriented x3  Psych: Normal affect, dress and demeanor. Normal speech. Normal thought content and judgment.  Assessment/Plan: Shelly Leonard is a 70 y.o. female present for acute OV for  Anxiousness Gastroesophageal reflux disease, esophagitis presence not specified - anxiousness  possibly 2/2 steroid use for her shoulder pain. Discussed waiting to allow ample time for steroids to be completely out of her system, before considering medication for anxiety. She is agreeable to plan.  - decrease caffeine.  - avoid steroid use - relaxation techniques.  - protonix daily for 2-3 weeks - F/U in 2 weeks if anxiousness not improved     electronically signed by:  Howard Pouch, DO  [place]

## 2016-10-24 DIAGNOSIS — M19012 Primary osteoarthritis, left shoulder: Secondary | ICD-10-CM | POA: Diagnosis not present

## 2016-11-26 ENCOUNTER — Other Ambulatory Visit: Payer: Self-pay | Admitting: Family Medicine

## 2017-01-15 ENCOUNTER — Encounter: Payer: Self-pay | Admitting: Family Medicine

## 2017-01-15 ENCOUNTER — Ambulatory Visit (INDEPENDENT_AMBULATORY_CARE_PROVIDER_SITE_OTHER): Payer: Medicare HMO | Admitting: Family Medicine

## 2017-01-15 VITALS — BP 123/72 | HR 74 | Temp 98.0°F | Resp 20 | Wt 185.5 lb

## 2017-01-15 DIAGNOSIS — J0100 Acute maxillary sinusitis, unspecified: Secondary | ICD-10-CM

## 2017-01-15 DIAGNOSIS — R69 Illness, unspecified: Secondary | ICD-10-CM | POA: Diagnosis not present

## 2017-01-15 DIAGNOSIS — F4321 Adjustment disorder with depressed mood: Secondary | ICD-10-CM

## 2017-01-15 DIAGNOSIS — F4320 Adjustment disorder, unspecified: Secondary | ICD-10-CM | POA: Diagnosis not present

## 2017-01-15 MED ORDER — AMOXICILLIN-POT CLAVULANATE 875-125 MG PO TABS: 1.0000 | ORAL_TABLET | Freq: Two times a day (BID) | ORAL | 0 refills | Status: DC

## 2017-01-15 NOTE — Patient Instructions (Addendum)
Start Augmentin every 12 hours for 10 days.  Rest and hydrate.  +/- flonase.    Sinusitis, Adult Sinusitis is soreness and inflammation of your sinuses. Sinuses are hollow spaces in the bones around your face. They are located:  Around your eyes.  In the middle of your forehead.  Behind your nose.  In your cheekbones. Your sinuses and nasal passages are lined with a stringy fluid (mucus). Mucus normally drains out of your sinuses. When your nasal tissues get inflamed or swollen, the mucus can get trapped or blocked so air cannot flow through your sinuses. This lets bacteria, viruses, and funguses grow, and that leads to infection. Follow these instructions at home: Medicines  Take, use, or apply over-the-counter and prescription medicines only as told by your doctor. These may include nasal sprays.  If you were prescribed an antibiotic medicine, take it as told by your doctor. Do not stop taking the antibiotic even if you start to feel better. Hydrate and Humidify  Drink enough water to keep your pee (urine) clear or pale yellow.  Use a cool mist humidifier to keep the humidity level in your home above 50%.  Breathe in steam for 10-15 minutes, 3-4 times a day or as told by your doctor. You can do this in the bathroom while a hot shower is running.  Try not to spend time in cool or dry air. Rest  Rest as much as possible.  Sleep with your head raised (elevated).  Make sure to get enough sleep each night. General instructions  Put a warm, moist washcloth on your face 3-4 times a day or as told by your doctor. This will help with discomfort.  Wash your hands often with soap and water. If there is no soap and water, use hand sanitizer.  Do not smoke. Avoid being around people who are smoking (secondhand smoke).  Keep all follow-up visits as told by your doctor. This is important. Contact a doctor if:  You have a fever.  Your symptoms get worse.  Your symptoms do not  get better within 10 days. Get help right away if:  You have a very bad headache.  You cannot stop throwing up (vomiting).  You have pain or swelling around your face or eyes.  You have trouble seeing.  You feel confused.  Your neck is stiff.  You have trouble breathing. This information is not intended to replace advice given to you by your health care provider. Make sure you discuss any questions you have with your health care provider. Document Released: 05/28/2008 Document Revised: 08/05/2016 Document Reviewed: 10/05/2015 Elsevier Interactive Patient Education  2017 [employer].

## 2017-01-15 NOTE — Progress Notes (Signed)
Shelly Leonard , 1946/08/03, 71 y.o., female MRN: [id number] Patient Care Team    Relationship Specialty Notifications Start End  [name], DO PCP - General Family Medicine  08/18/15   Warden Fillers, MD Consulting Physician Ophthalmology  05/08/16   Milus Banister, MD Attending Physician Gastroenterology  05/08/16     CC: headache Subjective: Pt presents for an acute OV with complaints of headache of 2 weeks duration.  Associated symptoms include headache, right ear pain, nasal congestion. She denies fever, chills, nausea, vomit, or diarrhea.  Pt has tried vics and sudafed to ease their symptoms.  No Known Allergies Social History  Substance Use Topics  . Smoking status: Never Smoker  . Smokeless tobacco: Never Used  . Alcohol use No   Past Medical History:  Diagnosis Date  . Allergy   . Anemia    low iron (hgb 8)  . Arthritis    knees and hands   . Carpal tunnel syndrome   . Cataract   . Diverticulitis large intestine w/o perforation or abscess w/bleeding   . GERD (gastroesophageal reflux disease)   . Headache(784.0)    occasional   . Hypertension   . Osteopenia   . PONV (postoperative nausea and vomiting)   . Vertigo   . Vitamin D deficiency    Past Surgical History:  Procedure Laterality Date  . BREAST REDUCTION SURGERY  1982  . CARPAL TUNNEL RELEASE     left   . COLONOSCOPY    . FOOT SURGERY    . KNEE ARTHROSCOPY     right knee 2012   . OTHER SURGICAL HISTORY     spur removed top of left foot   . TOTAL KNEE ARTHROPLASTY  04/21/2012   Procedure: TOTAL KNEE ARTHROPLASTY;  Surgeon: [name], MD;  Location: [facility];  Service: Orthopedics;  Laterality: Right;  [name] Kitchen VAGINAL HYSTERECTOMY  1977   Family History  Problem Relation Age of Onset  . Breast cancer Mother     late in life  . Diabetes Mother     late in life  . Heart disease Father   . Lung disease Father   . Colon cancer Neg Hx   . Esophageal cancer Neg Hx   . Stomach cancer Neg Hx    . Rectal cancer Neg Hx    Allergies as of 01/15/2017   No Known Allergies     Medication List       Accurate as of 01/15/17  2:00 PM. Always use your most recent med list.          acetaminophen 500 MG tablet Commonly known as:  TYLENOL Take 1,000 mg by mouth every 6 (six) hours as needed for mild pain or headache.   Blood Pressure Monitor/L Cuff Misc Please supply automatic BP cuff/device (please fit)   ciclopirox 0.77 % cream Commonly known as:  LOPROX APPLY  CREAM TOPICALLY TO AFFECTED AREA TWICE DAILY   cyclobenzaprine 5 MG tablet Commonly known as:  FLEXERIL Take 1 tablet (5 mg total) by mouth 2 (two) times daily as needed for muscle spasms.   fluticasone 50 MCG/ACT nasal spray Commonly known as:  FLONASE Place 2 sprays into both nostrils daily.   Garlic 123XX123 MG Tabs Take 100 mg by mouth daily with breakfast.   ibuprofen 800 MG tablet Commonly known as:  ADVIL,MOTRIN Take 1 tablet (800 mg total) by mouth 3 (three) times daily.   multivitamin with minerals Tabs tablet Take 1  tablet by mouth daily with breakfast.   OMEGA 3 PO Take 1 capsule by mouth daily.   pantoprazole 40 MG tablet Commonly known as:  PROTONIX Take 40 mg by mouth daily as needed.   traMADol 50 MG tablet Commonly known as:  ULTRAM Take 1 tablet (50 mg total) by mouth every 6 (six) hours as needed.   triamterene-hydrochlorothiazide 75-50 MG tablet Commonly known as:  MAXZIDE TAKE ONE TABLET BY MOUTH ONCE DAILY WITH BREAKFAST   verapamil 120 MG tablet Commonly known as:  CALAN Take 1 tablet (120 mg total) by mouth once.       No results found for this or any previous visit (from the past 24 hour(s)). No results found.   ROS: Negative, with the exception of above mentioned in HPI  Objective:  BP 123/72 (BP Location: Left Arm, Patient Position: Sitting, Cuff Size: Large)   Pulse 74   Temp 98 F (36.7 C)   Resp 20   Wt 185 lb 8 oz (84.1 kg)   SpO2 97%   BMI 33.93 kg/m    Body mass index is 33.93 kg/m. Gen: Afebrile. No acute distress. Nontoxic in appearance, well developed, well nourished.  HENT: AT. [place]. Bilateral TM visualized bilateral air fluid levels.. MMM, no oral lesions. Bilateral nares with swelling and drainage. Throat without erythema or exudates. PND present, TTP max sinus.  Eyes:Pupils Equal Round Reactive to light, Extraocular movements intact,  Conjunctiva without redness, discharge or icterus. Neck/lymp/endocrine: Supple,no lymphadenopathy CV: RRR, no edema Chest: CTAB, no wheeze or crackles. Good air movement, normal resp effort.  Abd: Soft. NTND. BS present.   Assessment/Plan: Shelly Leonard is a 71 y.o. female present for acute OV for  Acute maxillary sinusitis, recurrence not specified - rest and hydrate.  - restart flonase.  - amoxicillin-clavulanate (AUGMENTIN) 875-125 MG tablet; Take 1 tablet by mouth 2 (two) times daily.  Dispense: 20 tablet; Refill: 0 - F/U PRN, or sooner if symptoms are worsening.   Grief: pts husband passed away a few weeks ago. She reports she is coping alright. She is not eating as well, but taking efforts to drink plenty of fluids.  Offered condolences and if she feels she would like to discuss further or consider counseling, we would be happy to provide resources.    electronically signed by:  Howard Pouch, DO  [place]

## 2017-02-04 ENCOUNTER — Other Ambulatory Visit: Payer: Self-pay | Admitting: Family Medicine

## 2017-02-07 ENCOUNTER — Telehealth: Payer: Self-pay | Admitting: Family Medicine

## 2017-02-07 NOTE — Telephone Encounter (Signed)
Patient called to see if she can get the 90 day rx instead of the 30 day that she received for verapamil (CALAN) 120 MG tablet at the [place], [place] [phone] (Phone) [phone] (Fax)   Please call patient and explain why she needs to come into the office first, she does not want to come back into the office for that change in rx. Patient would like to speak to [place] or MD. Please call back at [phone].   Thank you.

## 2017-02-07 NOTE — Telephone Encounter (Signed)
Left message for patient to return call.

## 2017-02-08 ENCOUNTER — Telehealth: Payer: Self-pay | Admitting: Family Medicine

## 2017-02-08 NOTE — Telephone Encounter (Signed)
Patient needs follow up appt prior to anymore refills 30 day supply sent to patient pharmacy recently. Explained to patient she need to follow up every 6 months . Offered to schedule patient appt. She states she will call back.

## 2017-02-08 NOTE — Telephone Encounter (Signed)
Patient would like a call back at [phone]. Needs a refill on verapamil (CALAN) 120 MG tablet at  [place], [place] [phone] (Phone) [phone] (Fax)   Patient needs a 90 day rx not a 30 day rx. She does not want to come in for a follow up appt before getting refill (highly upset about not getting refill). Please call and explain what she needs to do.   Thank you

## 2017-02-19 ENCOUNTER — Ambulatory Visit (INDEPENDENT_AMBULATORY_CARE_PROVIDER_SITE_OTHER): Payer: Medicare HMO | Admitting: Family Medicine

## 2017-02-19 ENCOUNTER — Encounter: Payer: Self-pay | Admitting: Family Medicine

## 2017-02-19 VITALS — BP 118/76 | HR 66 | Temp 98.5°F | Resp 20 | Wt 180.8 lb

## 2017-02-19 DIAGNOSIS — Z6833 Body mass index (BMI) 33.0-33.9, adult: Secondary | ICD-10-CM | POA: Diagnosis not present

## 2017-02-19 DIAGNOSIS — J320 Chronic maxillary sinusitis: Secondary | ICD-10-CM | POA: Diagnosis not present

## 2017-02-19 DIAGNOSIS — I10 Essential (primary) hypertension: Secondary | ICD-10-CM | POA: Diagnosis not present

## 2017-02-19 DIAGNOSIS — R430 Anosmia: Secondary | ICD-10-CM | POA: Diagnosis not present

## 2017-02-19 DIAGNOSIS — B356 Tinea cruris: Secondary | ICD-10-CM | POA: Diagnosis not present

## 2017-02-19 MED ORDER — TRIAMTERENE-HCTZ 75-50 MG PO TABS: ORAL_TABLET | ORAL | 1 refills | Status: DC

## 2017-02-19 MED ORDER — METHYLPREDNISOLONE ACETATE 80 MG/ML IJ SUSP
80.0000 mg | Freq: Once | INTRAMUSCULAR | Status: AC
  Administered 2017-02-19: 80 mg via INTRAMUSCULAR

## 2017-02-19 MED ORDER — VERAPAMIL HCL 120 MG PO TABS: 120.0000 mg | ORAL_TABLET | Freq: Every day | ORAL | 1 refills | Status: DC

## 2017-02-19 MED ORDER — CICLOPIROX OLAMINE 0.77 % EX CREA: TOPICAL_CREAM | CUTANEOUS | 0 refills | Status: DC

## 2017-02-19 MED ORDER — DOXYCYCLINE HYCLATE 100 MG PO TABS: 100.0000 mg | ORAL_TABLET | Freq: Two times a day (BID) | ORAL | 0 refills | Status: DC

## 2017-02-19 MED ORDER — LEVOCETIRIZINE DIHYDROCHLORIDE 5 MG PO TABS: 5.0000 mg | ORAL_TABLET | Freq: Every evening | ORAL | 1 refills | Status: DC

## 2017-02-19 NOTE — Progress Notes (Signed)
Shelly Leonard , [birth year], 71 y.o., female MRN: [id number] Patient Care Team    Relationship Specialty Notifications Start End  [name], DO PCP - General Family Medicine  08/18/15   Warden Fillers, MD Consulting Physician Ophthalmology  05/08/16   Milus Banister, MD Attending Physician Gastroenterology  05/08/16     CC: Hypertension Subjective:   HTN: Pt reports compliance with verapamil 120 mg QD and maxzide (75-50) QD. Blood pressures ranges at home are normal. Patient denies chest pain, shortness of breath or lower extremity edema. Pt is not prescribed statin. BMP: 04/2016 WNL CBC: 04/2016 WNL Lipids: 06/2015 WNL - fish oil only Diet: watching closely Exercise: Walking RF: Fhx heart disease, Obesity.  Loss sense of smell and taste: Pt states since her sinus infection in 01/27/23 she has lost her sense of taste and smell. She has not been on any new medications, other than the treatment for the sinus infection with Augmentin. She has been using Flonase, but intermittently. She is not on an antiallergy medicine. She denies any dizziness, fevers or chills. She states that she is able to taste citrus foods and spicy foods, other foods have no taste. She states she wasn't certain if this was a manifestation of her recent grief, and she lost her husband around the same time. Patient has had no medication changes or head trauma. She denies any family history of Parkinson's or Alzheimer's.  Grief: Pt states she is doing ok with the grief since her husband passed away in 27-Jan-2023. She has decided to have counseling through her church. She reports she continues to go to the activities she had prior [employer], bowling etc). She does not get as much as enjoyment of those activities as prior and states she is going through the motions, but she is going. She still does not feel she needs any additional help.    No Known Allergies Social History  Substance Use Topics  . Smoking status:  Never Smoker  . Smokeless tobacco: Never Used  . Alcohol use No   Past Medical History:  Diagnosis Date  . Allergy   . Anemia    low iron (hgb 8)  . Arthritis    knees and hands   . Carpal tunnel syndrome   . Cataract   . Diverticulitis large intestine w/o perforation or abscess w/bleeding   . GERD (gastroesophageal reflux disease)   . Headache(784.0)    occasional   . Hypertension   . Osteopenia   . PONV (postoperative nausea and vomiting)   . Vertigo   . Vitamin D deficiency    Past Surgical History:  Procedure Laterality Date  . BREAST REDUCTION SURGERY  1982  . CARPAL TUNNEL RELEASE     left   . COLONOSCOPY    . FOOT SURGERY    . KNEE ARTHROSCOPY     right knee 2012   . OTHER SURGICAL HISTORY     spur removed top of left foot   . TOTAL KNEE ARTHROPLASTY  04/21/2012   Procedure: TOTAL KNEE ARTHROPLASTY;  Surgeon: [name], MD;  Location: [facility];  Service: Orthopedics;  Laterality: Right;  [name] Kitchen VAGINAL HYSTERECTOMY  1977   Family History  Problem Relation Age of Onset  . Breast cancer Mother     late in life  . Diabetes Mother     late in life  . Heart disease Father   . Lung disease Father   . Colon cancer Neg Hx   .  Esophageal cancer Neg Hx   . Stomach cancer Neg Hx   . Rectal cancer Neg Hx    Allergies as of 02/19/2017   No Known Allergies     Medication List       Accurate as of 02/19/17  1:58 PM. Always use your most recent med list.          acetaminophen 500 MG tablet Commonly known as:  TYLENOL Take 1,000 mg by mouth every 6 (six) hours as needed for mild pain or headache.   Blood Pressure Monitor/L Cuff Misc Please supply automatic BP cuff/device (please fit)   ciclopirox 0.77 % cream Commonly known as:  LOPROX APPLY  CREAM TOPICALLY TO AFFECTED AREA TWICE DAILY   cyclobenzaprine 5 MG tablet Commonly known as:  FLEXERIL Take 1 tablet (5 mg total) by mouth 2 (two) times daily as needed for muscle spasms.   fluticasone 50 MCG/ACT  nasal spray Commonly known as:  FLONASE Place 2 sprays into both nostrils daily.   Garlic 123XX123 MG Tabs Take 100 mg by mouth daily with breakfast.   ibuprofen 800 MG tablet Commonly known as:  ADVIL,MOTRIN Take 1 tablet (800 mg total) by mouth 3 (three) times daily.   multivitamin with minerals Tabs tablet Take 1 tablet by mouth daily with breakfast.   OMEGA 3 PO Take 1 capsule by mouth daily.   pantoprazole 40 MG tablet Commonly known as:  PROTONIX Take 40 mg by mouth daily as needed.   traMADol 50 MG tablet Commonly known as:  ULTRAM Take 1 tablet (50 mg total) by mouth every 6 (six) hours as needed.   triamterene-hydrochlorothiazide 75-50 MG tablet Commonly known as:  MAXZIDE TAKE ONE TABLET BY MOUTH ONCE DAILY WITH BREAKFAST   verapamil 120 MG tablet Commonly known as:  CALAN TAKE ONE TABLET BY MOUTH ONCE DAILY       No results found for this or any previous visit (from the past 24 hour(s)). No results found.   ROS: Negative, with the exception of above mentioned in HPI   Objective:  BP 118/76 (BP Location: Left Arm, Patient Position: Sitting, Cuff Size: Large)   Pulse 66   Temp 98.5 F (36.9 C)   Resp 20   Wt 180 lb 12 oz (82 kg)   SpO2 97%   BMI 33.06 kg/m  Body mass index is 33.06 kg/m. Gen: Afebrile. No acute distress. Nontoxic in appearance, well developed, well nourished. Very pleasant AAF. Obese.  HENT: AT. [place]. Bilateral TM visualized with fullness, no erythema.  MMM, no oral lesions. Bilateral nares with swelling, drainage and eyrthema. Throat without erythema or exudates. No cough. No hoarseness, TTP max sinus.  Eyes:Pupils Equal Round Reactive to light, Extraocular movements intact,  Conjunctiva without redness, discharge or icterus. Neck/lymp/endocrine: Supple,no lymphadenopathy CV: RRR, noedema Chest: CTAB, no wheeze or crackles. Good air movement, normal resp effort.  Neuro:  Normal gait. PERLA. EOMi. Alert. Oriented x3  Psych: Grieving  normally. Normal affect, dress and demeanor. Normal speech. Normal thought content and judgment.  Assessment/Plan: Shelly Leonard is a 71 y.o. female present for acute OV for  Tinea cruris - recurrent, refilled cream per request - ciclopirox (LOPROX) 0.77 % cream; APPLY  CREAM TOPICALLY TO AFFECTED AREA TWICE DAILY  Dispense: 30 g; Refill: 0   Essential hypertension, benign/BMI 33 - Stable today. Continue  verapamil 120 mg QD and maxzide (75-50) QD. Refills provided today. - Low salt diet and exercise encouraged.  - triamterene-hydrochlorothiazide (MAXZIDE) 75-50  MG tablet; TAKE ONE TABLET BY MOUTH ONCE DAILY WITH BREAKFAST  Dispense: 90 tablet; Refill: 1 - verapamil (CALAN) 120 MG tablet; Take 1 tablet (120 mg total) by mouth daily.  Dispense: 90 tablet; Refill: 1 - F/U Q 6 months  Loss of sense of smell/Chronic maxillary sinusitis - New - discussed the possibility of chronic sinusitis/allergies by exam today and condition started after sinus infection in January. - Prescribed flonase and xyzal daily. IM depo medrol today (she does not do well with oral steroids).  - Retreat with doxy (last time Augmentin).  - methylPREDNISolone acetate (DEPO-MEDROL) injection 80 mg; Inject 1 mL (80 mg total) into the muscle once. - If this does not improve her symptoms would consider ENT or Neuro referral, depending in symptoms.  - F/U 3-4 weeks if still symptomatic.    electronically signed by:  Howard Pouch, DO  [place]

## 2017-02-19 NOTE — Patient Instructions (Addendum)
Start xyzal daily and Flonase daily.  If your sense of taste/smell does not start to improve, I would want to see you back and discuss further evaluation.   Doxycyline for 10 days with food.  Prednisone shot today.   You can have these type of symptoms with a chronic sinus infection.

## 2017-02-20 DIAGNOSIS — R430 Anosmia: Secondary | ICD-10-CM | POA: Insufficient documentation

## 2017-03-08 ENCOUNTER — Ambulatory Visit (INDEPENDENT_AMBULATORY_CARE_PROVIDER_SITE_OTHER): Payer: Medicare HMO | Admitting: Family Medicine

## 2017-03-08 ENCOUNTER — Encounter: Payer: Self-pay | Admitting: Family Medicine

## 2017-03-08 VITALS — BP 130/76 | HR 72 | Temp 98.8°F | Resp 20 | Wt 180.5 lb

## 2017-03-08 DIAGNOSIS — J329 Chronic sinusitis, unspecified: Secondary | ICD-10-CM | POA: Insufficient documentation

## 2017-03-08 DIAGNOSIS — R430 Anosmia: Secondary | ICD-10-CM

## 2017-03-08 MED ORDER — AZELASTINE-FLUTICASONE 137-50 MCG/ACT NA SUSP: 1.0000 | Freq: Two times a day (BID) | NASAL | 11 refills | Status: DC

## 2017-03-08 MED ORDER — MONTELUKAST SODIUM 10 MG PO TABS: 10.0000 mg | ORAL_TABLET | Freq: Every day | ORAL | 3 refills | Status: DC

## 2017-03-08 NOTE — Patient Instructions (Addendum)
Start singulair also at night.  Continue flonase unless new nasal spray approved.  Continue xyzal.  Mucinex DM will help with drainage.  I have referred you to ENT, they will call you to schedule.     Nasal Allergies Nasal allergies are a reaction to allergens in the air. Allergens are tiny specks (particles) in the air that cause your body to have an allergic reaction. Nasal allergies are not passed from person to person (contagious). They cannot be cured, but they can be controlled. Common causes of nasal allergies include:  Pollen from grasses, trees, and weeds.  House dust mites.  Pet dander.  Mold. Follow these instructions at home:  Avoid the allergen that is causing your symptoms, if you can.  Keep windows closed. If possible, use air conditioning when there is a lot of pollen in the air.  Do not use fans in your home.  Do not hang clothes outside to dry.  Wear sunglasses to keep pollen out of your eyes.  Wash your hands right away after you touch household pets.  Take over-the-counter and prescription medicines only as told by your doctor.  Keep all follow-up visits as told by your doctor. This is important. Contact a doctor if:  You have a fever.  You have a cough that does not go away (is persistent).  You start to make whistling sounds when you breathe (wheeze).  Your symptoms do not get better with treatment.  You have thick fluid coming from your nose.  You start to have nosebleeds. Get help right away if:  Your tongue or your lips are swollen.  You have trouble breathing.  You feel light-headed or you feel like you are going to pass out (faint).  You have cold sweats. This information is not intended to replace advice given to you by your health care provider. Make sure you discuss any questions you have with your health care provider. Document Released: 04/11/2011 Document Revised: 05/17/2016 Document Reviewed: 06/22/2015 Elsevier Interactive  Patient Education  2017 [employer].

## 2017-03-08 NOTE — Progress Notes (Signed)
Shelly Leonard , Nov 27, 1946, 71 y.o., female MRN: [id number] Patient Care Team    Relationship Specialty Notifications Start End  [name], DO PCP - General Family Medicine  08/18/15   Warden Fillers, MD Consulting Physician Ophthalmology  05/08/16   Milus Banister, MD Attending Physician Gastroenterology  05/08/16     CC: chronic sinusitis Subjective:     Chronic sinusitis/Loss sense of smell and taste:  Pt states she had on mild and temporary improvement after last treatment with doxy and IM depo medrol. She reports compliance with floanse and xyzal daily. She states her sense of smell is a little improved, but her sense of taste is still severely diminished unless its something citrus. She is still having difficulty breathing out of her nose and is experiencing headaches/facial pressure over sinus cavities intermittently. She denies fever, chills. Dizziness, nausea or vomit.    Prior note:  Pt states since her sinus infection in January she has lost her sense of taste and smell. She has not been on any new medications, other than the treatment for the sinus infection with Augmentin. She has been using Flonase, but intermittently. She is not on an antiallergy medicine. She denies any dizziness, fevers or chills. She states that she is able to taste citrus foods and spicy foods, other foods have no taste. She states she wasn't certain if this was a manifestation of her recent grief, and she lost her husband around the same time. Patient has had no medication changes or head trauma. She denies any family history of Parkinson's or Alzheimer's.   No Known Allergies Social History  Substance Use Topics  . Smoking status: Never Smoker  . Smokeless tobacco: Never Used  . Alcohol use No   Past Medical History:  Diagnosis Date  . Allergy   . Anemia    low iron (hgb 8)  . Arthritis    knees and hands   . Carpal tunnel syndrome   . Cataract   . Diverticulitis large  intestine w/o perforation or abscess w/bleeding   . GERD (gastroesophageal reflux disease)   . Headache(784.0)    occasional   . Hypertension   . Osteopenia   . PONV (postoperative nausea and vomiting)   . Vertigo   . Vitamin D deficiency    Past Surgical History:  Procedure Laterality Date  . BREAST REDUCTION SURGERY  1982  . CARPAL TUNNEL RELEASE     left   . COLONOSCOPY    . FOOT SURGERY    . KNEE ARTHROSCOPY     right knee 2012   . OTHER SURGICAL HISTORY     spur removed top of left foot   . TOTAL KNEE ARTHROPLASTY  04/21/2012   Procedure: TOTAL KNEE ARTHROPLASTY;  Surgeon: [name], MD;  Location: [facility];  Service: Orthopedics;  Laterality: Right;  [name] Kitchen VAGINAL HYSTERECTOMY  1977   Family History  Problem Relation Age of Onset  . Breast cancer Mother     late in life  . Diabetes Mother     late in life  . Heart disease Father   . Lung disease Father   . Colon cancer Neg Hx   . Esophageal cancer Neg Hx   . Stomach cancer Neg Hx   . Rectal cancer Neg Hx    Allergies as of 03/08/2017   No Known Allergies     Medication List       Accurate as of 03/08/17  9:55 AM.  Always use your most recent med list.          ciclopirox 0.77 % cream Commonly known as:  LOPROX APPLY  CREAM TOPICALLY TO AFFECTED AREA TWICE DAILY   cyclobenzaprine 5 MG tablet Commonly known as:  FLEXERIL Take 1 tablet (5 mg total) by mouth 2 (two) times daily as needed for muscle spasms.   fluticasone 50 MCG/ACT nasal spray Commonly known as:  FLONASE Place 2 sprays into both nostrils daily.   Garlic 546 MG Tabs Take 100 mg by mouth daily with breakfast.   ibuprofen 800 MG tablet Commonly known as:  ADVIL,MOTRIN Take 1 tablet (800 mg total) by mouth 3 (three) times daily.   levocetirizine 5 MG tablet Commonly known as:  XYZAL Take 1 tablet (5 mg total) by mouth every evening.   multivitamin with minerals Tabs tablet Take 1 tablet by mouth daily with breakfast.   OMEGA 3  PO Take 1 capsule by mouth daily.   pantoprazole 40 MG tablet Commonly known as:  PROTONIX Take 40 mg by mouth daily as needed.   triamterene-hydrochlorothiazide 75-50 MG tablet Commonly known as:  MAXZIDE TAKE ONE TABLET BY MOUTH ONCE DAILY WITH BREAKFAST   verapamil 120 MG tablet Commonly known as:  CALAN Take 1 tablet (120 mg total) by mouth daily.       No results found for this or any previous visit (from the past 24 hour(s)). No results found.   ROS: Negative, with the exception of above mentioned in HPI   Objective:  BP 130/76 (BP Location: Right Arm, Patient Position: Sitting, Cuff Size: Large)   Pulse 72   Temp 98.8 F (37.1 C)   Resp 20   Wt 180 lb 8 oz (81.9 kg)   SpO2 97%   BMI 33.01 kg/m  Body mass index is 33.01 kg/m. Gen: Afebrile. No acute distress. Very pleasant AAF.  HENT: AT. . Bilateral TM visualized and normal in appearance, mild fullness bilaterally. MMM. Bilateral nares with swelling, mild erythema.. Throat without erythema or exudates. Mild PND present. Mild hoarseness. Mild TTP frontal sinus.  Eyes:Pupils Equal Round Reactive to light, Extraocular movements intact,  Conjunctiva without redness, discharge or icterus. Neck/lymp/endocrine: Supple,no lymphadenopathy CV: RRR  Chest: CTAB, no wheeze or crackles Skin: no rashes, purpura or petechiae.  Neuro:  Normal gait. PERLA. EOMi. Alert. Oriented.  Assessment/Plan: Shelly Leonard is a 71 y.o. female present for acute OV for  Loss of sense of smell/Chronic maxillary sinusitis - not controlled today - discussed the possibility of chronic sinusitis/allergies by exam today and condition started after sinus infection in January. - Continue xyzal and flonase (until new nasal spray approved). - Dymista prescribed, hopefully insurance will cover it.  - Start Singular, prescribed. - No fever, chills today. Lung exam normal. Appears to be chronic sinusitis with new loss of smell taste since  January.  - she has had a round of Augmentin and then doxycyline without improvement in the last 2 weeks.  - ENT referral placed today, she would like Dr. Ernesto Rutherford if able.  - F/u PRN   electronically signed by:  Howard Pouch, DO  [place]

## 2017-03-11 ENCOUNTER — Telehealth: Payer: Self-pay | Admitting: Family Medicine

## 2017-03-11 NOTE — Telephone Encounter (Signed)
Patient is calling to report that prescription she picked up on Friday is making her chest tight.  montelukast (SINGULAIR) 10 MG tablet   Thank you,  -LL

## 2017-03-11 NOTE — Telephone Encounter (Signed)
Tried to call patient back voice mail not set up unable to leave message.

## 2017-03-12 NOTE — Telephone Encounter (Signed)
Spoke with patient she states she stopped taking the singulair and chest tightness went away. Patient states she is feeling better since stopping the singulair. Patient states she has an appt with Dr Ernesto Rutherford next week.

## 2017-03-26 DIAGNOSIS — R430 Anosmia: Secondary | ICD-10-CM | POA: Diagnosis not present

## 2017-03-26 DIAGNOSIS — H0289 Other specified disorders of eyelid: Secondary | ICD-10-CM | POA: Diagnosis not present

## 2017-03-26 DIAGNOSIS — J342 Deviated nasal septum: Secondary | ICD-10-CM | POA: Diagnosis not present

## 2017-03-26 DIAGNOSIS — H10413 Chronic giant papillary conjunctivitis, bilateral: Secondary | ICD-10-CM | POA: Diagnosis not present

## 2017-03-26 DIAGNOSIS — H16223 Keratoconjunctivitis sicca, not specified as Sjogren's, bilateral: Secondary | ICD-10-CM | POA: Diagnosis not present

## 2017-03-26 DIAGNOSIS — B370 Candidal stomatitis: Secondary | ICD-10-CM | POA: Diagnosis not present

## 2017-03-26 DIAGNOSIS — J321 Chronic frontal sinusitis: Secondary | ICD-10-CM | POA: Diagnosis not present

## 2017-03-26 DIAGNOSIS — J370 Chronic laryngitis: Secondary | ICD-10-CM | POA: Diagnosis not present

## 2017-03-26 DIAGNOSIS — H43811 Vitreous degeneration, right eye: Secondary | ICD-10-CM | POA: Diagnosis not present

## 2017-03-26 DIAGNOSIS — H43812 Vitreous degeneration, left eye: Secondary | ICD-10-CM | POA: Diagnosis not present

## 2017-03-26 DIAGNOSIS — H2513 Age-related nuclear cataract, bilateral: Secondary | ICD-10-CM | POA: Diagnosis not present

## 2017-03-26 DIAGNOSIS — J320 Chronic maxillary sinusitis: Secondary | ICD-10-CM | POA: Diagnosis not present

## 2017-03-26 DIAGNOSIS — J322 Chronic ethmoidal sinusitis: Secondary | ICD-10-CM | POA: Diagnosis not present

## 2017-05-10 ENCOUNTER — Telehealth: Payer: Self-pay

## 2017-05-10 NOTE — Telephone Encounter (Signed)
Patient is on the list for [employer] 2018 and may be a good candidate for an AWV. Please let me know if/when appt is scheduled.   

## 2017-05-10 NOTE — Telephone Encounter (Signed)
Yes, patient will be contacted to schedule.

## 2017-05-10 NOTE — Telephone Encounter (Signed)
Will pt be contacted for an appt?

## 2017-05-10 NOTE — Telephone Encounter (Signed)
Last AWV 05/08/16. No future appointment scheduled.

## 2017-05-14 ENCOUNTER — Other Ambulatory Visit: Payer: Self-pay | Admitting: Family Medicine

## 2017-05-14 DIAGNOSIS — Z1231 Encounter for screening mammogram for malignant neoplasm of breast: Secondary | ICD-10-CM

## 2017-05-16 NOTE — Telephone Encounter (Signed)
Spoke with patient regarding AWV. Patient states she will call back next week to schedule.

## 2017-06-24 NOTE — Progress Notes (Addendum)
Subjective:   Shelly Leonard is a 71 y.o. female who presents for Medicare Annual (Subsequent) preventive examination.  Patient very active as Multimedia programmer.   Review of Systems:  No ROS.  Medicare Wellness Visit. Additional risk factors are reflected in the social history.  Cardiac Risk Factors include: advanced age (>28men, >12 women);hypertension;obesity (BMI >30kg/m2)   Sleep patterns: Sleeps 4-5 hours, does not feel rested. Plans to try Melatonin. Up to void x 2.  Home Safety/Smoke Alarms: Feels safe in home. Smoke alarms in place.  Living environment; residence and Firearm Safety: Lives alone in 2 story home, uses rail. Recently lost husband (5 months ago), 2 brothers live in neighborhood.  Seat Belt Safety/Bike Helmet: Wears seat belt.   Counseling:   Eye Exam-Last exam 03/2017, yearly. Dr. Katy Fitch.  Dental-Last exam > 2 years. Will make appointment.   Female:   Pap-N/A       Mammo-06/14/2016, negative. Scheduled for today.   Dexa scan-06/14/2016,  Osteopenia.       [facility]-Colonoscopy 05/29/2016, polyp. Recall 10 years.       Objective:     Vitals: BP (!) 148/84 (BP Location: Right Arm, Patient Position: Sitting, Cuff Size: Normal)   Pulse 66   Ht 5\' 2"  (1.575 m)   Wt 182 lb 1.9 oz (82.6 kg)   SpO2 98%   BMI 33.31 kg/m   Body mass index is 33.31 kg/m.   Tobacco History  Smoking Status  . Never Smoker  Smokeless Tobacco  . Never Used     Counseling given: Not Answered   Past Medical History:  Diagnosis Date  . Allergy   . Anemia    low iron (hgb 8)  . Arthritis    knees and hands   . Carpal tunnel syndrome   . Cataract   . Diverticulitis large intestine w/o perforation or abscess w/bleeding   . GERD (gastroesophageal reflux disease)   . Headache(784.0)    occasional   . Hypertension   . Osteopenia   . PONV (postoperative nausea and vomiting)   . Vertigo   . Vitamin D deficiency    Past Surgical History:  Procedure Laterality Date  .  BREAST REDUCTION SURGERY  1982  . CARPAL TUNNEL RELEASE     left   . COLONOSCOPY    . FOOT SURGERY    . KNEE ARTHROSCOPY     right knee 2012   . OTHER SURGICAL HISTORY     spur removed top of left foot   . TOTAL KNEE ARTHROPLASTY  04/21/2012   Procedure: TOTAL KNEE ARTHROPLASTY;  Surgeon: [name], MD;  Location: [facility];  Service: Orthopedics;  Laterality: Right;  [name] Kitchen VAGINAL HYSTERECTOMY  1977   Family History  Problem Relation Age of Onset  . Breast cancer Mother        late in life  . Diabetes Mother        late in life  . Heart disease Father   . Lung disease Father   . Colon cancer Neg Hx   . Esophageal cancer Neg Hx   . Stomach cancer Neg Hx   . Rectal cancer Neg Hx    History  Sexual Activity  . Sexual activity: Yes  . Birth control/ protection: Post-menopausal    Outpatient Encounter Prescriptions as of 06/25/2017  Medication Sig  . Azelastine-Fluticasone 137-50 MCG/ACT SUSP Place 1 spray into the nose 2 (two) times daily.  . ciclopirox (LOPROX) 0.77 % cream APPLY  CREAM  TOPICALLY TO AFFECTED AREA TWICE DAILY  . cyclobenzaprine (FLEXERIL) 5 MG tablet Take 1 tablet (5 mg total) by mouth 2 (two) times daily as needed for muscle spasms.  . fluticasone (FLONASE) 50 MCG/ACT nasal spray Place 2 sprays into both nostrils daily.  . Garlic 616 MG TABS Take 100 mg by mouth daily with breakfast.   . ibuprofen (ADVIL,MOTRIN) 800 MG tablet Take 1 tablet (800 mg total) by mouth 3 (three) times daily.  . Multiple Vitamin (MULITIVITAMIN WITH MINERALS) TABS Take 1 tablet by mouth daily with breakfast.  . Omega-3 Fatty Acids (OMEGA 3 PO) Take 1 capsule by mouth daily.  . Probiotic Product (PROBIOTIC DAILY PO) Take by mouth.  . triamterene-hydrochlorothiazide (MAXZIDE) 75-50 MG tablet TAKE ONE TABLET BY MOUTH ONCE DAILY WITH BREAKFAST  . verapamil (CALAN) 120 MG tablet Take 1 tablet (120 mg total) by mouth daily.  [name] Kitchen levocetirizine (XYZAL) 5 MG tablet Take 1 tablet (5 mg total) by  mouth every evening. (Patient not taking: Reported on 06/25/2017)  . montelukast (SINGULAIR) 10 MG tablet Take 1 tablet (10 mg total) by mouth at bedtime. (Patient not taking: Reported on 06/25/2017)  . pantoprazole (PROTONIX) 40 MG tablet Take 40 mg by mouth daily as needed.  . Zoster Vac Recomb Adjuvanted [facility]) injection Inject 0.5 mLs into the muscle once.   No facility-administered encounter medications on file as of 06/25/2017.     Activities of Daily Living In your present state of health, do you have any difficulty performing the following activities: 06/25/2017  Hearing? N  Vision? N  Difficulty concentrating or making decisions? N  Walking or climbing stairs? N  Dressing or bathing? N  Doing errands, shopping? N  Preparing Food and eating ? N  Using the Toilet? N  In the past six months, have you accidently leaked urine? N  Do you have problems with loss of bowel control? N  Managing your Medications? N  Managing your Finances? N  Housekeeping or managing your Housekeeping? N  Some recent data might be hidden    Patient Care Team: [name], DO as PCP - General (Family Medicine) Warden Fillers, MD as Consulting Physician (Ophthalmology) Milus Banister, MD as Attending Physician (Gastroenterology) Thornell Sartorius, MD as Consulting Physician (Otolaryngology)    Assessment:    Physical assessment deferred to PCP.  Exercise Activities and Dietary recommendations Current Exercise Habits: Home exercise routine (Housework, yard), Type of exercise: Other - see comments, Time (Minutes): 30, Frequency (Times/Week): 7, Weekly Exercise (Minutes/Week): 210, Exercise limited by: None identified   Diet (meal preparation, eat out, water intake, caffeinated beverages, dairy products, fruits and vegetables): Drinks water, powerade and juice.   Breakfast: oatmeal, coffee, bacon, egg, fruit Lunch: sandwich, vegetables Dinner: vegetables, protein  Discussed heart healthy  diet and increasing activity.   Goals    . Weight (lb) < 160 lb (72.6 kg)          Lose weight by increasing activity and eating healthier.      Fall Risk Fall Risk  06/25/2017 05/08/2016 05/19/2015  Falls in the past year? No No Yes  Number falls in past yr: - - 2 or more  Injury with Fall? - - Yes   Depression Screen PHQ 2/9 Scores 06/25/2017 05/08/2016 05/19/2015  PHQ - 2 Score 0 0 0  Upset frequently from recent loss of husband.   Cognitive Function       Ad8 score reviewed for issues:  Issues making decisions: no  Less interest in hobbies / activities: no  Repeats questions, stories (family complaining): no  Trouble using ordinary gadgets (microwave, computer, phone): no  Forgets the month or year: no  Mismanaging finances: no  Remembering appts: no  Daily problems with thinking and/or memory: no Ad8 score is=0     Immunization History  Administered Date(s) Administered  . Pneumococcal Conjugate-13 04/01/2014  . Pneumococcal Polysaccharide-23 11/29/2015  . Td 08/14/2011  . Zoster 01/20/2013   Screening Tests Health Maintenance  Topic Date Due  . MAMMOGRAM  06/14/2017  . INFLUENZA VACCINE  07/24/2017  . DEXA SCAN  06/14/2018  . TETANUS/TDAP  08/13/2021  . COLONOSCOPY  05/29/2026  . Hepatitis C Screening  Completed  . PNA vac Low Risk Adult  Addressed   Shingles Vaccine Rx sent to pharmacy. Mammogram scheduled for today.      Plan:    Bring a copy of your advance directives to your next office visit.  Continue doing brain stimulating activities (puzzles, reading, adult coloring books, staying active) to keep memory sharp.   Shingrix Vaccine at pharmacy  I have personally reviewed and noted the following in the patient's chart:   . Medical and social history . Use of alcohol, tobacco or illicit drugs  . Current medications and supplements . Functional ability and status . Nutritional status . Physical activity . Advanced directives . List  of other physicians . Hospitalizations, surgeries, and ER visits in previous 12 months . Vitals . Screenings to include cognitive, depression, and falls . Referrals and appointments  In addition, I have reviewed and discussed with patient certain preventive protocols, quality metrics, and best practice recommendations. A written personalized care plan for preventive services as well as general preventive health recommendations were provided to patient.     Gerilyn Nestle, RN  06/25/2017  PCP Note: -Mammogram scheduled for today -Shingrix Rx sent to pharmacy  Medical screening examination/treatment/procedure(s) were performed by non-physician practitioner and as supervising physician I was immediately available for consultation/collaboration.  I agree with above assessment and plan.  Electronically Signed by: Howard Pouch, DO [place] [facility] [place]

## 2017-06-25 ENCOUNTER — Ambulatory Visit (INDEPENDENT_AMBULATORY_CARE_PROVIDER_SITE_OTHER): Payer: Medicare HMO

## 2017-06-25 ENCOUNTER — Ambulatory Visit (HOSPITAL_BASED_OUTPATIENT_CLINIC_OR_DEPARTMENT_OTHER)
Admission: RE | Admit: 2017-06-25 | Discharge: 2017-06-25 | Disposition: A | Discharge status: Home / Self Care | Payer: Medicare HMO | Source: Ambulatory Visit | Attending: Family Medicine | Admitting: Family Medicine

## 2017-06-25 ENCOUNTER — Encounter (HOSPITAL_BASED_OUTPATIENT_CLINIC_OR_DEPARTMENT_OTHER): Payer: Self-pay

## 2017-06-25 VITALS — BP 148/84 | HR 66 | Ht 62.0 in | Wt 182.1 lb

## 2017-06-25 DIAGNOSIS — Z23 Encounter for immunization: Secondary | ICD-10-CM | POA: Diagnosis not present

## 2017-06-25 DIAGNOSIS — Z1231 Encounter for screening mammogram for malignant neoplasm of breast: Secondary | ICD-10-CM | POA: Insufficient documentation

## 2017-06-25 DIAGNOSIS — Z0000 Encounter for general adult medical examination without abnormal findings: Secondary | ICD-10-CM

## 2017-06-25 MED ORDER — ZOSTER VAC RECOMB ADJUVANTED 50 MCG/0.5ML IM SUSR: 0.5000 mL | Freq: Once | INTRAMUSCULAR | 1 refills | Status: AC

## 2017-06-25 NOTE — Patient Instructions (Addendum)
Bring a copy of your advance directives to your next office visit.  Continue doing brain stimulating activities (puzzles, reading, adult coloring books, staying active) to keep memory sharp.   Shingrix Vaccine at pharmacy   Fall Prevention in the Home Falls can cause injuries. They can happen to people of all ages. There are many things you can do to make your home safe and to help prevent falls. What can I do on the outside of my home?  Regularly fix the edges of walkways and driveways and fix any cracks.  Remove anything that might make you trip as you walk through a door, such as a raised step or threshold.  Trim any bushes or trees on the path to your home.  Use bright outdoor lighting.  Clear any walking paths of anything that might make someone trip, such as rocks or tools.  Regularly check to see if handrails are loose or broken. Make sure that both sides of any steps have handrails.  Any raised decks and porches should have guardrails on the edges.  Have any leaves, snow, or ice cleared regularly.  Use sand or salt on walking paths during winter.  Clean up any spills in your garage right away. This includes oil or grease spills. What can I do in the bathroom?  Use night lights.  Install grab bars by the toilet and in the tub and shower. Do not use towel bars as grab bars.  Use non-skid mats or decals in the tub or shower.  If you need to sit down in the shower, use a plastic, non-slip stool.  Keep the floor dry. Clean up any water that spills on the floor as soon as it happens.  Remove soap buildup in the tub or shower regularly.  Attach bath mats securely with double-sided non-slip rug tape.  Do not have throw rugs and other things on the floor that can make you trip. What can I do in the bedroom?  Use night lights.  Make sure that you have a light by your bed that is easy to reach.  Do not use any sheets or blankets that are too big for your bed. They  should not hang down onto the floor.  Have a firm chair that has side arms. You can use this for support while you get dressed.  Do not have throw rugs and other things on the floor that can make you trip. What can I do in the kitchen?  Clean up any spills right away.  Avoid walking on wet floors.  Keep items that you use a lot in easy-to-reach places.  If you need to reach something above you, use a strong step stool that has a grab bar.  Keep electrical cords out of the way.  Do not use floor polish or wax that makes floors slippery. If you must use wax, use non-skid floor wax.  Do not have throw rugs and other things on the floor that can make you trip. What can I do with my stairs?  Do not leave any items on the stairs.  Make sure that there are handrails on both sides of the stairs and use them. Fix handrails that are broken or loose. Make sure that handrails are as long as the stairways.  Check any carpeting to make sure that it is firmly attached to the stairs. Fix any carpet that is loose or worn.  Avoid having throw rugs at the top or bottom of the stairs. If  you do have throw rugs, attach them to the floor with carpet tape.  Make sure that you have a light switch at the top of the stairs and the bottom of the stairs. If you do not have them, ask someone to add them for you. What else can I do to help prevent falls?  Wear shoes that: ? Do not have high heels. ? Have rubber bottoms. ? Are comfortable and fit you well. ? Are closed at the toe. Do not wear sandals.  If you use a stepladder: ? Make sure that it is fully opened. Do not climb a closed stepladder. ? Make sure that both sides of the stepladder are locked into place. ? Ask someone to hold it for you, if possible.  Clearly mark and make sure that you can see: ? Any grab bars or handrails. ? First and last steps. ? Where the edge of each step is.  Use tools that help you move around (mobility aids) if  they are needed. These include: ? Canes. ? Walkers. ? Scooters. ? Crutches.  Turn on the lights when you go into a dark area. Replace any light bulbs as soon as they burn out.  Set up your furniture so you have a clear path. Avoid moving your furniture around.  If any of your floors are uneven, fix them.  If there are any pets around you, be aware of where they are.  Review your medicines with your doctor. Some medicines can make you feel dizzy. This can increase your chance of falling. Ask your doctor what other things that you can do to help prevent falls. This information is not intended to replace advice given to you by your health care provider. Make sure you discuss any questions you have with your health care provider. Document Released: 10/06/2009 Document Revised: 05/17/2016 Document Reviewed: 01/14/2015 Elsevier Interactive Patient Education  2018 [place] Maintenance, Female Adopting a healthy lifestyle and getting preventive care can go a long way to promote health and wellness. Talk with your health care provider about what schedule of regular examinations is right for you. This is a good chance for you to check in with your provider about disease prevention and staying healthy. In between checkups, there are plenty of things you can do on your own. Experts have done a lot of research about which lifestyle changes and preventive measures are most likely to keep you healthy. Ask your health care provider for more information. Weight and diet Eat a healthy diet  Be sure to include plenty of vegetables, fruits, low-fat dairy products, and lean protein.  Do not eat a lot of foods high in solid fats, added sugars, or salt.  Get regular exercise. This is one of the most important things you can do for your health. ? Most adults should exercise for at least 150 minutes each week. The exercise should increase your heart rate and make you sweat (moderate-intensity  exercise). ? Most adults should also do strengthening exercises at least twice a week. This is in addition to the moderate-intensity exercise.  Maintain a healthy weight  Body mass index (BMI) is a measurement that can be used to identify possible weight problems. It estimates body fat based on height and weight. Your health care provider can help determine your BMI and help you achieve or maintain a healthy weight.  For females 24 years of age and older: ? A BMI below 18.5 is considered underweight. ? A BMI of  18.5 to 24.9 is normal. ? A BMI of 25 to 29.9 is considered overweight. ? A BMI of 30 and above is considered obese.  Watch levels of cholesterol and blood lipids  You should start having your blood tested for lipids and cholesterol at 70 years of age, then have this test every 5 years.  You may need to have your cholesterol levels checked more often if: ? Your lipid or cholesterol levels are high. ? You are older than 70 years of age. ? You are at high risk for heart disease.  Cancer screening Lung Cancer  Lung cancer screening is recommended for adults 82-57 years old who are at high risk for lung cancer because of a history of smoking.  A yearly low-dose CT scan of the lungs is recommended for people who: ? Currently smoke. ? Have quit within the past 15 years. ? Have at least a 30-pack-year history of smoking. A pack year is smoking an average of one pack of cigarettes a day for 1 year.  Yearly screening should continue until it has been 15 years since you quit.  Yearly screening should stop if you develop a health problem that would prevent you from having lung cancer treatment.  Breast Cancer  Practice breast self-awareness. This means understanding how your breasts normally appear and feel.  It also means doing regular breast self-exams. Let your health care provider know about any changes, no matter how small.  If you are in your 20s or 30s, you should have a  clinical breast exam (CBE) by a health care provider every 1-3 years as part of a regular health exam.  If you are 70 or older, have a CBE every year. Also consider having a breast X-ray (mammogram) every year.  If you have a family history of breast cancer, talk to your health care provider about genetic screening.  If you are at high risk for breast cancer, talk to your health care provider about having an MRI and a mammogram every year.  Breast cancer gene (BRCA) assessment is recommended for women who have family members with BRCA-related cancers. BRCA-related cancers include: ? Breast. ? Ovarian. ? Tubal. ? Peritoneal cancers.  Results of the assessment will determine the need for genetic counseling and BRCA1 and BRCA2 testing.  Cervical Cancer Your health care provider may recommend that you be screened regularly for cancer of the pelvic organs (ovaries, uterus, and vagina). This screening involves a pelvic examination, including checking for microscopic changes to the surface of your cervix (Pap test). You may be encouraged to have this screening done every 3 years, beginning at age 8.  For women ages 48-65, health care providers may recommend pelvic exams and Pap testing every 3 years, or they may recommend the Pap and pelvic exam, combined with testing for human papilloma virus (HPV), every 5 years. Some types of HPV increase your risk of cervical cancer. Testing for HPV may also be done on women of any age with unclear Pap test results.  Other health care providers may not recommend any screening for nonpregnant women who are considered low risk for pelvic cancer and who do not have symptoms. Ask your health care provider if a screening pelvic exam is right for you.  If you have had past treatment for cervical cancer or a condition that could lead to cancer, you need Pap tests and screening for cancer for at least 20 years after your treatment. If Pap tests have been discontinued,  your  risk factors (such as having a new sexual partner) need to be reassessed to determine if screening should resume. Some women have medical problems that increase the chance of getting cervical cancer. In these cases, your health care provider may recommend more frequent screening and Pap tests.  Colorectal Cancer  This type of cancer can be detected and often prevented.  Routine colorectal cancer screening usually begins at 70 years of age and continues through 70 years of age.  Your health care provider may recommend screening at an earlier age if you have risk factors for colon cancer.  Your health care provider may also recommend using home test kits to check for hidden blood in the stool.  A small camera at the end of a tube can be used to examine your colon directly (sigmoidoscopy or colonoscopy). This is done to check for the earliest forms of colorectal cancer.  Routine screening usually begins at age 64.  Direct examination of the colon should be repeated every 5-10 years through 70 years of age. However, you may need to be screened more often if early forms of precancerous polyps or small growths are found.  Skin Cancer  Check your skin from head to toe regularly.  Tell your health care provider about any new moles or changes in moles, especially if there is a change in a mole's shape or color.  Also tell your health care provider if you have a mole that is larger than the size of a pencil eraser.  Always use sunscreen. Apply sunscreen liberally and repeatedly throughout the day.  Protect yourself by wearing long sleeves, pants, a wide-brimmed hat, and sunglasses whenever you are outside.  Heart disease, diabetes, and high blood pressure  High blood pressure causes heart disease and increases the risk of stroke. High blood pressure is more likely to develop in: ? People who have blood pressure in the high end of the normal range (130-139/85-89 mm Hg). ? People who are  overweight or obese. ? People who are African American.  If you are 54-25 years of age, have your blood pressure checked every 3-5 years. If you are 32 years of age or older, have your blood pressure checked every year. You should have your blood pressure measured twice-once when you are at a hospital or clinic, and once when you are not at a hospital or clinic. Record the average of the two measurements. To check your blood pressure when you are not at a hospital or clinic, you can use: ? An automated blood pressure machine at a pharmacy. ? A home blood pressure monitor.  If you are between 73 years and 63 years old, ask your health care provider if you should take aspirin to prevent strokes.  Have regular diabetes screenings. This involves taking a blood sample to check your fasting blood sugar level. ? If you are at a normal weight and have a low risk for diabetes, have this test once every three years after 70 years of age. ? If you are overweight and have a high risk for diabetes, consider being tested at a younger age or more often. Preventing infection Hepatitis B  If you have a higher risk for hepatitis B, you should be screened for this virus. You are considered at high risk for hepatitis B if: ? You were born in a country where hepatitis B is common. Ask your health care provider which countries are considered high risk. ? Your parents were born in a high-risk country,  and you have not been immunized against hepatitis B (hepatitis B vaccine). ? You have HIV or AIDS. ? You use needles to inject street drugs. ? You live with someone who has hepatitis B. ? You have had sex with someone who has hepatitis B. ? You get hemodialysis treatment. ? You take certain medicines for conditions, including cancer, organ transplantation, and autoimmune conditions.  Hepatitis C  Blood testing is recommended for: ? Everyone born from [birth year] through 1964. ? Anyone with known risk factors for  hepatitis C.  Sexually transmitted infections (STIs)  You should be screened for sexually transmitted infections (STIs) including gonorrhea and chlamydia if: ? You are sexually active and are younger than 70 years of age. ? You are older than 70 years of age and your health care provider tells you that you are at risk for this type of infection. ? Your sexual activity has changed since you were last screened and you are at an increased risk for chlamydia or gonorrhea. Ask your health care provider if you are at risk.  If you do not have HIV, but are at risk, it may be recommended that you take a prescription medicine daily to prevent HIV infection. This is called pre-exposure prophylaxis (PrEP). You are considered at risk if: ? You are sexually active and do not regularly use condoms or know the HIV status of your partner(s). ? You take drugs by injection. ? You are sexually active with a partner who has HIV.  Talk with your health care provider about whether you are at high risk of being infected with HIV. If you choose to begin PrEP, you should first be tested for HIV. You should then be tested every 3 months for as long as you are taking PrEP. Pregnancy  If you are premenopausal and you may become pregnant, ask your health care provider about preconception counseling.  If you may become pregnant, take 400 to 800 micrograms (mcg) of folic acid every day.  If you want to prevent pregnancy, talk to your health care provider about birth control (contraception). Osteoporosis and menopause  Osteoporosis is a disease in which the bones lose minerals and strength with aging. This can result in serious bone fractures. Your risk for osteoporosis can be identified using a bone density scan.  If you are 51 years of age or older, or if you are at risk for osteoporosis and fractures, ask your health care provider if you should be screened.  Ask your health care provider whether you should take a  calcium or vitamin D supplement to lower your risk for osteoporosis.  Menopause may have certain physical symptoms and risks.  Hormone replacement therapy may reduce some of these symptoms and risks. Talk to your health care provider about whether hormone replacement therapy is right for you. Follow these instructions at home:  Schedule regular health, dental, and eye exams.  Stay current with your immunizations.  Do not use any tobacco products including cigarettes, chewing tobacco, or electronic cigarettes.  If you are pregnant, do not drink alcohol.  If you are breastfeeding, limit how much and how often you drink alcohol.  Limit alcohol intake to no more than 1 drink per day for nonpregnant women. One drink equals 12 ounces of beer, 5 ounces of wine, or 1 ounces of hard liquor.  Do not use street drugs.  Do not share needles.  Ask your health care provider for help if you need support or information about quitting drugs.  Tell your health care provider if you often feel depressed.  Tell your health care provider if you have ever been abused or do not feel safe at home. This information is not intended to replace advice given to you by your health care provider. Make sure you discuss any questions you have with your health care provider. Document Released: 06/25/2011 Document Revised: 05/17/2016 Document Reviewed: 09/13/2015 Elsevier Interactive Patient Education  [employer].

## 2017-06-28 NOTE — Telephone Encounter (Signed)
Reviewed EMR - pt has completed AWV with [facility] on 06/25/2017

## 2017-07-08 ENCOUNTER — Encounter: Payer: Self-pay | Admitting: Family Medicine

## 2017-07-08 ENCOUNTER — Telehealth: Payer: Self-pay | Admitting: Family Medicine

## 2017-07-08 ENCOUNTER — Ambulatory Visit (INDEPENDENT_AMBULATORY_CARE_PROVIDER_SITE_OTHER): Payer: Medicare HMO | Admitting: Family Medicine

## 2017-07-08 VITALS — BP 117/81 | HR 65 | Temp 98.8°F | Resp 20 | Wt 184.5 lb

## 2017-07-08 DIAGNOSIS — J329 Chronic sinusitis, unspecified: Secondary | ICD-10-CM

## 2017-07-08 MED ORDER — AZELASTINE HCL 0.1 % NA SOLN: 2.0000 | Freq: Two times a day (BID) | NASAL | 12 refills | Status: DC

## 2017-07-08 MED ORDER — DOXYCYCLINE HYCLATE 100 MG PO TABS: 100.0000 mg | ORAL_TABLET | Freq: Two times a day (BID) | ORAL | 0 refills | Status: DC

## 2017-07-08 NOTE — Telephone Encounter (Signed)
Please call Shelly Leonard and tell her I tried to call in another nasal spray for her. It is like the one the insurance would not cover, but it may cover this one. If it does she is to use this and the flonase both.

## 2017-07-08 NOTE — Patient Instructions (Signed)
Restart the Singulair script you have ([facility]) at night.  Start Claritin daily (OTC) Continue flonase.   Start antibiotic every 12 hours for 10 days.   If continue with symptoms then we will need to consider a CT of the sinus.

## 2017-07-08 NOTE — Progress Notes (Signed)
Shelly Leonard , [birth year], 71 y.o., female MRN: [id number] Patient Care Team    Relationship Specialty Notifications Start End  [name], DO PCP - General Family Medicine  08/18/15   Warden Fillers, MD Consulting Physician Ophthalmology  05/08/16   Milus Banister, MD Attending Physician Gastroenterology  05/08/16   Thornell Sartorius, MD Consulting Physician Otolaryngology  06/25/17     Chief Complaint  Patient presents with  . URI    congestion     Subjective:   Congestion: Patient presented with nasal congestion, headache, mildly productive cough and puffy eyes of about 2 weeks. She still endorses decreased sense of taste and smell. duration. She denies fever, chills, nausea, vomiting or diarrhea. She is fatigued, but states this is from her headache. She has had recurrent sinus infections over the last 6 -12 months. She was referred to ENT in March, which agreed she had a recurrent sinus infection again at that time and treated her with antibiotics. She has not been using the Xyzal or the Singulair, because she felt one of them made her chest tight. She is using the Flonase, Azelastine was not approved by her insurance.  Depression screen [facility] 2/9 06/25/2017 05/08/2016 05/19/2015  Decreased Interest 0 0 0  Down, Depressed, Hopeless 0 0 0  PHQ - 2 Score 0 0 0    No Known Allergies Social History  Substance Use Topics  . Smoking status: Never Smoker  . Smokeless tobacco: Never Used  . Alcohol use No   Past Medical History:  Diagnosis Date  . Allergy   . Anemia    low iron (hgb 8)  . Arthritis    knees and hands   . Carpal tunnel syndrome   . Cataract   . Diverticulitis large intestine w/o perforation or abscess w/bleeding   . GERD (gastroesophageal reflux disease)   . Headache(784.0)    occasional   . Hypertension   . Osteopenia   . PONV (postoperative nausea and vomiting)   . Vertigo   . Vitamin D deficiency    Past Surgical History:  Procedure  Laterality Date  . BREAST REDUCTION SURGERY  1982  . CARPAL TUNNEL RELEASE     left   . COLONOSCOPY    . FOOT SURGERY    . KNEE ARTHROSCOPY     right knee 2012   . OTHER SURGICAL HISTORY     spur removed top of left foot   . REDUCTION MAMMAPLASTY  1982  . TOTAL KNEE ARTHROPLASTY  04/21/2012   Procedure: TOTAL KNEE ARTHROPLASTY;  Surgeon: [name], MD;  Location: [facility];  Service: Orthopedics;  Laterality: Right;  [name] Kitchen VAGINAL HYSTERECTOMY  1977   Family History  Problem Relation Age of Onset  . Breast cancer Mother        late in life  . Diabetes Mother        late in life  . Heart disease Father   . Lung disease Father   . Colon cancer Neg Hx   . Esophageal cancer Neg Hx   . Stomach cancer Neg Hx   . Rectal cancer Neg Hx    Allergies as of 07/08/2017   No Known Allergies     Medication List       Accurate as of 07/08/17  1:44 PM. Always use your most recent med list.          aspirin EC 81 MG tablet Take 81 mg by mouth daily.  ciclopirox 0.77 % cream Commonly known as:  LOPROX APPLY  CREAM TOPICALLY TO AFFECTED AREA TWICE DAILY   fluticasone 50 MCG/ACT nasal spray Commonly known as:  FLONASE Place 2 sprays into both nostrils daily.   Garlic 220 MG Tabs Take 100 mg by mouth daily with breakfast.   ibuprofen 800 MG tablet Commonly known as:  ADVIL,MOTRIN Take 1 tablet (800 mg total) by mouth 3 (three) times daily.   levocetirizine 5 MG tablet Commonly known as:  XYZAL Take 1 tablet (5 mg total) by mouth every evening.   montelukast 10 MG tablet Commonly known as:  SINGULAIR Take 1 tablet (10 mg total) by mouth at bedtime.   multivitamin with minerals Tabs tablet Take 1 tablet by mouth daily with breakfast.   OMEGA 3 PO Take 1 capsule by mouth daily.   pantoprazole 40 MG tablet Commonly known as:  PROTONIX Take 40 mg by mouth daily as needed.   PROBIOTIC DAILY PO Take by mouth.   triamterene-hydrochlorothiazide 75-50 MG tablet Commonly  known as:  MAXZIDE TAKE ONE TABLET BY MOUTH ONCE DAILY WITH BREAKFAST   verapamil 120 MG tablet Commonly known as:  CALAN Take 1 tablet (120 mg total) by mouth daily.       All past medical history, surgical history, allergies, family history, immunizations andmedications were updated in the EMR today and reviewed under the history and medication portions of their EMR.     ROS: Negative, with the exception of above mentioned in HPI   Objective:  BP 117/81 (BP Location: Left Arm, Patient Position: Sitting, Cuff Size: Large)   Pulse 65   Temp 98.8 F (37.1 C)   Resp 20   Wt 184 lb 8 oz (83.7 kg)   SpO2 98%   BMI 33.75 kg/m  Body mass index is 33.75 kg/m. Gen: Afebrile. No acute distress. Nontoxic in appearance, well developed, well nourished.  HENT: AT. [place]. Bilateral TM visualized with fullness bilaterally. MMM, no oral lesions. Bilateral nares with swelling and drainage. Throat without erythema or exudates. No cough, mild hoarseness. Tender palpation maxillary sinus. Puffy eyes. Eyes:Pupils Equal Round Reactive to light, Extraocular movements intact,  Conjunctiva without redness, discharge or icterus. Neck/lymp/endocrine: Supple, no lymphadenopathy CV: RRR, no edema Chest: CTAB, no wheeze or crackles. Good air movement, normal resp effort.  Skin: no rashes, purpura or petechiae.  Neuro: Normal gait. PERLA. EOMi. Alert. Oriented x3  No exam data present No results found. No results found for this or any previous visit (from the past 24 hour(s)).  Assessment/Plan: Shelly Leonard is a 71 y.o. female present for OV for  Chronic sinusitis, unspecified location - If symptoms do not completely resolve, we will move forward with imaging sinuses, given her loss of sense of taste/smell. -Start Claritin over-the-counter, restart the Singulair. Continue Flonase. Add Astelin. Tried to get this covered in a combo, and insurance would not pay for combo. - Start doxycycline twice a day  10 days - Patient established with ENT. - doxycycline (VIBRA-TABS) 100 MG tablet; Take 1 tablet (100 mg total) by mouth 2 (two) times daily.  Dispense: 20 tablet; Refill: 0 - azelastine (ASTELIN) 0.1 % nasal spray; Place 2 sprays into both nostrils 2 (two) times daily. Use in each nostril as directed  Dispense: 30 mL; Refill: 12 -Follow-up 1-2 weeks if no improvement.    Reviewed expectations re: course of current medical issues.  Discussed self-management of symptoms.  Outlined signs and symptoms indicating need for more acute intervention.  Patient verbalized understanding and all questions were answered.  Patient received an After-Visit Summary.    No orders of the defined types were placed in this encounter.    Note is dictated utilizing voice recognition software. Although note has been proof read prior to signing, occasional typographical errors still can be missed. If any questions arise, please do not hesitate to call for verification.   electronically signed by:  Howard Pouch, DO  [place]

## 2017-07-09 ENCOUNTER — Telehealth: Payer: Self-pay

## 2017-07-09 MED ORDER — BUDESONIDE 32 MCG/ACT NA SUSP: 2.0000 | Freq: Every day | NASAL | 0 refills | Status: DC

## 2017-07-09 NOTE — Telephone Encounter (Signed)
Patient called stating that the medication sent in was $73 and couldn't afford it.  She asked if there was anything she could take OTC.

## 2017-07-09 NOTE — Telephone Encounter (Signed)
Left message with detailed information and instructions on patient voice mail per DPR.

## 2017-07-09 NOTE — Telephone Encounter (Signed)
Patient states the nasal spray is $73 and she can not afford this.  She wants to know if there is something comparable that is over the counter, in which she can take.

## 2017-07-09 NOTE — Telephone Encounter (Signed)
Unfortunately there is not an OTC med like this. Flonase will still be helpful, the Astelin was was going to be added benefit for her.  We could also try to switch out flonase for a different nasal steroid.  I have prescribed many different types. I really need to know what is on her formulary or cheapest price. I know tired to call in rhinocort --> please check out of pocket on this. If we can not find something that works, she will have to stick with Flonase and we could refer her to allergist

## 2017-07-09 NOTE — Telephone Encounter (Signed)
Patient notified and verbalized understanding. 

## 2017-09-24 ENCOUNTER — Other Ambulatory Visit: Payer: Self-pay | Admitting: Family Medicine

## 2017-09-24 DIAGNOSIS — I10 Essential (primary) hypertension: Secondary | ICD-10-CM

## 2017-09-27 ENCOUNTER — Ambulatory Visit (INDEPENDENT_AMBULATORY_CARE_PROVIDER_SITE_OTHER): Payer: Medicare HMO | Admitting: Family Medicine

## 2017-09-27 ENCOUNTER — Encounter: Payer: Self-pay | Admitting: Family Medicine

## 2017-09-27 VITALS — BP 129/88 | HR 68 | Temp 97.9°F | Resp 20 | Ht 62.0 in | Wt 183.8 lb

## 2017-09-27 DIAGNOSIS — M159 Polyosteoarthritis, unspecified: Secondary | ICD-10-CM

## 2017-09-27 DIAGNOSIS — F4381 Prolonged grief disorder: Secondary | ICD-10-CM

## 2017-09-27 DIAGNOSIS — R69 Illness, unspecified: Secondary | ICD-10-CM | POA: Diagnosis not present

## 2017-09-27 DIAGNOSIS — F4321 Adjustment disorder with depressed mood: Secondary | ICD-10-CM | POA: Diagnosis not present

## 2017-09-27 DIAGNOSIS — F4329 Adjustment disorder with other symptoms: Secondary | ICD-10-CM

## 2017-09-27 MED ORDER — ESCITALOPRAM OXALATE 5 MG PO TABS: 5.0000 mg | ORAL_TABLET | Freq: Every day | ORAL | 0 refills | Status: DC

## 2017-09-27 NOTE — Patient Instructions (Signed)
We will write a letter for you to try to get you excused from [place] duty.  I have referred you to Dr. Mamie Levers. They will call you to schedule.   Start the lexapro 5 mg a day, take around the same time everyday.   Follow up on the start of lexapro and your BP at the end of November.

## 2017-09-27 NOTE — Progress Notes (Signed)
Shelly Leonard , 12/29/1945, 71 y.o., female MRN: [id number] Patient Care Team    Relationship Specialty Notifications Start End  [name], DO PCP - General Family Medicine  08/18/15   Warden Fillers, MD Consulting Physician Ophthalmology  05/08/16   Milus Banister, MD Attending Physician Gastroenterology  05/08/16   Thornell Sartorius, MD Consulting Physician Otolaryngology  06/25/17     Chief Complaint  Patient presents with  . medical excuse    excuse from jury duty due to mental and physical state     Subjective: Pt presents for an OV with complaints of mental and physical reasons she just "can not" perform her jury duty.  Patient has lost her husband in January 2018 and is having difficulty with her grieving process. She has declined therapy or medication assistance in the past. She attempts to go Silver sneakers a few times a week and continues to go to church and play the piano. She does feel that she is getting as much enjoyment out of her activities that she has done prior. She is mostly feeling like she is going through the motions. She reports she did not think she was still feel like this many months out from his death. She also has a history of arthritis in bilateral knees, wrists and hands. Sitting for long periods of time or difficult for her. She takes over-the-counter medication for comfort.   Depression screen [facility] 2/9 09/27/2017 06/25/2017 05/08/2016 05/19/2015  Decreased Interest 0 0 0 0  Down, Depressed, Hopeless 3 0 0 0  PHQ - 2 Score 3 0 0 0  Altered sleeping 2 - - -  Tired, decreased energy 2 - - -  Change in appetite 1 - - -  Feeling bad or failure about yourself  0 - - -  Trouble concentrating 1 - - -  Moving slowly or fidgety/restless 0 - - -  Suicidal thoughts 0 - - -  PHQ-9 Score 9 - - -  Difficult doing work/chores Somewhat difficult - - -    No Known Allergies Social History  Substance Use Topics  . Smoking status: Never Smoker  .  Smokeless tobacco: Never Used  . Alcohol use No   Past Medical History:  Diagnosis Date  . Allergy   . Anemia    low iron (hgb 8)  . Arthritis    knees and hands   . Carpal tunnel syndrome   . Cataract   . Diverticulitis large intestine w/o perforation or abscess w/bleeding   . GERD (gastroesophageal reflux disease)   . Headache(784.0)    occasional   . Hypertension   . Osteopenia   . PONV (postoperative nausea and vomiting)   . Vertigo   . Vitamin D deficiency    Past Surgical History:  Procedure Laterality Date  . BREAST REDUCTION SURGERY  1982  . CARPAL TUNNEL RELEASE     left   . COLONOSCOPY    . FOOT SURGERY    . KNEE ARTHROSCOPY     right knee 2012   . OTHER SURGICAL HISTORY     spur removed top of left foot   . REDUCTION MAMMAPLASTY  1982  . TOTAL KNEE ARTHROPLASTY  04/21/2012   Procedure: TOTAL KNEE ARTHROPLASTY;  Surgeon: [name], MD;  Location: [facility];  Service: Orthopedics;  Laterality: Right;  [name] Kitchen VAGINAL HYSTERECTOMY  1977   Family History  Problem Relation Age of Onset  . Breast cancer Mother  late in life  . Diabetes Mother        late in life  . Heart disease Father   . Lung disease Father   . Colon cancer Neg Hx   . Esophageal cancer Neg Hx   . Stomach cancer Neg Hx   . Rectal cancer Neg Hx    Allergies as of 09/27/2017   No Known Allergies     Medication List       Accurate as of 09/27/17  2:07 PM. Always use your most recent med list.          aspirin EC 81 MG tablet Take 81 mg by mouth daily.   budesonide 32 MCG/ACT nasal spray Commonly known as:  RHINOCORT AQUA Place 2 sprays into both nostrils daily.   ciclopirox 0.77 % cream Commonly known as:  LOPROX APPLY  CREAM TOPICALLY TO AFFECTED AREA TWICE DAILY   doxycycline 100 MG tablet Commonly known as:  VIBRA-TABS Take 1 tablet (100 mg total) by mouth 2 (two) times daily.   fluticasone 50 MCG/ACT nasal spray Commonly known as:  FLONASE Place 2 sprays into both  nostrils daily.   Garlic 578 MG Tabs Take 100 mg by mouth daily with breakfast.   montelukast 10 MG tablet Commonly known as:  SINGULAIR Take 1 tablet (10 mg total) by mouth at bedtime.   multivitamin with minerals Tabs tablet Take 1 tablet by mouth daily with breakfast.   OMEGA 3 PO Take 1 capsule by mouth daily.   pantoprazole 40 MG tablet Commonly known as:  PROTONIX Take 40 mg by mouth daily as needed.   PROBIOTIC DAILY PO Take by mouth.   triamterene-hydrochlorothiazide 75-50 MG tablet Commonly known as:  MAXZIDE TAKE 1 TABLET BY MOUTH ONCE DAILY WITH  BREAKFAST   verapamil 120 MG tablet Commonly known as:  CALAN TAKE 1 TABLET BY MOUTH ONCE DAILY       All past medical history, surgical history, allergies, family history, immunizations andmedications were updated in the EMR today and reviewed under the history and medication portions of their EMR.     ROS: Negative, with the exception of above mentioned in HPI   Objective:  BP 129/88 (BP Location: Right Arm, Patient Position: Sitting, Cuff Size: Large)   Pulse 68   Temp 97.9 F (36.6 C)   Resp 20   Ht 5\' 2"  (1.575 m)   Wt 183 lb 12 oz (83.3 kg)   SpO2 95%   BMI 33.61 kg/m  Body mass index is 33.61 kg/m. Gen: Afebrile. No acute distress. Nontoxic in appearance, well developed, well nourished. Pleasant African-American female. HENT: AT. [place].MMM Eyes:Pupils Equal Round Reactive to light, Extraocular movements intact,  Conjunctiva without redness, discharge or icterus. CV: RRR Chest: CTAB, no wheeze or crackles.  Neuro:  Normal gait. PERLA. EOMi. Alert. Oriented x3 Psych: Sad, tearful at times otherwise Normal affect, dress and demeanor. Normal speech. Normal thought content and judgment.  No exam data present No results found. No results found for this or any previous visit (from the past 24 hour(s)).  Assessment/Plan: Shelly Leonard is a 71 y.o. female present for OV for  Grief reaction with  prolonged bereavement - Patient is agreeable to therapy/counseling referral today. - Start Lexapro 5 mg daily. - Ambulatory referral to Psychology- Aleatha Borer - Patient has an appointment in November for hypertension, we'll follow-up on her grief at that time.  Osteoarthritis of multiple joints, unspecified osteoarthritis type - Continue over-the-counter medications. - Excuse from  jury duty completed today for both arthritis and her grief/depression.   Reviewed expectations re: course of current medical issues.  Discussed self-management of symptoms.  Outlined signs and symptoms indicating need for more acute intervention.  Patient verbalized understanding and all questions were answered.  Patient received an After-Visit Summary.    No orders of the defined types were placed in this encounter.    Note is dictated utilizing voice recognition software. Although note has been proof read prior to signing, occasional typographical errors still can be missed. If any questions arise, please do not hesitate to call for verification.   electronically signed by:  Howard Pouch, DO  [place]

## 2017-09-30 ENCOUNTER — Telehealth: Payer: Self-pay | Admitting: Family Medicine

## 2017-09-30 DIAGNOSIS — I10 Essential (primary) hypertension: Secondary | ICD-10-CM

## 2017-09-30 MED ORDER — VERAPAMIL HCL 120 MG PO TABS: 120.0000 mg | ORAL_TABLET | Freq: Every day | ORAL | 0 refills | Status: DC

## 2017-09-30 MED ORDER — TRIAMTERENE-HCTZ 75-50 MG PO TABS: ORAL_TABLET | ORAL | 0 refills | Status: DC

## 2017-09-30 NOTE — Telephone Encounter (Signed)
I believe that was a short term prescription for her to make it to her appointment.  I have changed this to x1 90 day supply, and she will need follow-up before needing additional refills (prior to 3 months).  Hypertension was not covered during the appointment last week it was focused on her grief ,arthritis and inability to complete jury Duty. She did not mention her hypertension.  As for the Lexapro, I would need to know what's on the formulary for her in order to prescribe something else. I do not think we received pharmacy notification this was rejected or needed a prior?

## 2017-09-30 NOTE — Telephone Encounter (Signed)
Patient requesting refills on BP medications she was seen 09/27/17 for grief related issues . Last seen for HTN 02/19/17. Also she states she cant afford lexapro. Please advise.

## 2017-09-30 NOTE — Telephone Encounter (Signed)
Patient states she was to have received a 90 day supply of triamterene-hydrochlorothiazide (MAXZIDE) 75-50 MG tablet & verapamil (CALAN) 120 MG tablet.  However, the pharmacy has advised pcp approved a 30 day supply.  Patient is requesting a 90 day supply for both meds.  She also states pcp also prescribed her lexapro 5 mg.  This will cost her $150 and she is unable to afford this.

## 2017-10-01 NOTE — Telephone Encounter (Signed)
Patient notified and expressed understanding. She stated that she would call back to schedule an appointment before out of medication.

## 2017-12-04 ENCOUNTER — Ambulatory Visit: Payer: Medicare HMO | Admitting: Clinical

## 2017-12-04 ENCOUNTER — Ambulatory Visit: Payer: Self-pay | Admitting: Family Medicine

## 2017-12-11 ENCOUNTER — Ambulatory Visit: Payer: Self-pay | Admitting: Family Medicine

## 2017-12-11 ENCOUNTER — Encounter: Payer: Self-pay | Admitting: Family Medicine

## 2017-12-11 ENCOUNTER — Ambulatory Visit: Payer: Medicare HMO | Admitting: Family Medicine

## 2017-12-11 VITALS — BP 120/80 | HR 62 | Temp 97.9°F | Wt 181.4 lb

## 2017-12-11 DIAGNOSIS — J329 Chronic sinusitis, unspecified: Secondary | ICD-10-CM | POA: Diagnosis not present

## 2017-12-11 DIAGNOSIS — J0100 Acute maxillary sinusitis, unspecified: Secondary | ICD-10-CM

## 2017-12-11 DIAGNOSIS — R430 Anosmia: Secondary | ICD-10-CM

## 2017-12-11 MED ORDER — FLUTICASONE PROPIONATE 50 MCG/ACT NA SUSP: 2.0000 | Freq: Every day | NASAL | 6 refills | Status: DC

## 2017-12-11 MED ORDER — DOXYCYCLINE HYCLATE 100 MG PO TABS: 100.0000 mg | ORAL_TABLET | Freq: Two times a day (BID) | ORAL | 0 refills | Status: DC

## 2017-12-11 MED ORDER — MONTELUKAST SODIUM 10 MG PO TABS: 10.0000 mg | ORAL_TABLET | Freq: Every day | ORAL | 3 refills | Status: DC

## 2017-12-11 MED ORDER — AZELASTINE HCL 0.1 % NA SOLN: 2.0000 | Freq: Two times a day (BID) | NASAL | 12 refills | Status: DC

## 2017-12-11 NOTE — Patient Instructions (Addendum)
Continue claritin over the counter.  Continue singulair, refilled today --> this is the one you take at night.  Continue flonase daily--> refilled Start Astelin nasal spray as well, this is prescribed.  Start mucinec over the counter.   Doxycyline antibiotic prescribed. Rest and hydrate.      Sinusitis, Adult Sinusitis is soreness and inflammation of your sinuses. Sinuses are hollow spaces in the bones around your face. They are located:  Around your eyes.  In the middle of your forehead.  Behind your nose.  In your cheekbones.  Your sinuses and nasal passages are lined with a stringy fluid (mucus). Mucus normally drains out of your sinuses. When your nasal tissues get inflamed or swollen, the mucus can get trapped or blocked so air cannot flow through your sinuses. This lets bacteria, viruses, and funguses grow, and that leads to infection. Follow these instructions at home: Medicines  Take, use, or apply over-the-counter and prescription medicines only as told by your doctor. These may include nasal sprays.  If you were prescribed an antibiotic medicine, take it as told by your doctor. Do not stop taking the antibiotic even if you start to feel better. Hydrate and Humidify  Drink enough water to keep your pee (urine) clear or pale yellow.  Use a cool mist humidifier to keep the humidity level in your home above 50%.  Breathe in steam for 10-15 minutes, 3-4 times a day or as told by your doctor. You can do this in the bathroom while a hot shower is running.  Try not to spend time in cool or dry air. Rest  Rest as much as possible.  Sleep with your head raised (elevated).  Make sure to get enough sleep each night. General instructions  Put a warm, moist washcloth on your face 3-4 times a day or as told by your doctor. This will help with discomfort.  Wash your hands often with soap and water. If there is no soap and water, use hand sanitizer.  Do not smoke. Avoid  being around people who are smoking (secondhand smoke).  Keep all follow-up visits as told by your doctor. This is important. Contact a doctor if:  You have a fever.  Your symptoms get worse.  Your symptoms do not get better within 10 days. Get help right away if:  You have a very bad headache.  You cannot stop throwing up (vomiting).  You have pain or swelling around your face or eyes.  You have trouble seeing.  You feel confused.  Your neck is stiff.  You have trouble breathing. This information is not intended to replace advice given to you by your health care provider. Make sure you discuss any questions you have with your health care provider. Document Released: 05/28/2008 Document Revised: 08/05/2016 Document Reviewed: 10/05/2015 Elsevier Interactive Patient Education  [employer].

## 2017-12-11 NOTE — Progress Notes (Signed)
Shelly Leonard , Apr 25, 1946, 71 y.o., female MRN: [id number] Patient Care Team    Relationship Specialty Notifications Start End  [name], DO PCP - General Family Medicine  08/18/15   Warden Fillers, MD Consulting Physician Ophthalmology  05/08/16   Milus Banister, MD Attending Physician Gastroenterology  05/08/16   Thornell Sartorius, MD Consulting Physician Otolaryngology  06/25/17     Chief Complaint  Patient presents with  . URI    pts complain of nasal congestion for about a month     Subjective:   Congestion: Pt endorses Sneezing, coughing, nasal congestion, coughing up phlegm duration about 4 weeks, worsening over last few days. She denies fever, chills, nausea, vomit or diarrhea. She has continued flonase and claritin daily. She has had recurrent sinus infections, last was about 5 months ago.  She was referred to ENT in March 2017, which agreed she had a recurrent sinus infection again at that time and treated her with antibiotics. She has not been using the Xyzal made her chest tight. She is using the Flonase. Other nasal steroids have all been denied by insurance.   Depression screen [facility] 2/9 09/27/2017 06/25/2017 05/08/2016 05/19/2015  Decreased Interest 0 0 0 0  Down, Depressed, Hopeless 3 0 0 0  PHQ - 2 Score 3 0 0 0  Altered sleeping 2 - - -  Tired, decreased energy 2 - - -  Change in appetite 1 - - -  Feeling bad or failure about yourself  0 - - -  Trouble concentrating 1 - - -  Moving slowly or fidgety/restless 0 - - -  Suicidal thoughts 0 - - -  PHQ-9 Score 9 - - -  Difficult doing work/chores Somewhat difficult - - -    No Known Allergies Social History   Tobacco Use  . Smoking status: Never Smoker  . Smokeless tobacco: Never Used  Substance Use Topics  . Alcohol use: No   Past Medical History:  Diagnosis Date  . Allergy   . Anemia    low iron (hgb 8)  . Arthritis    knees and hands   . Carpal tunnel syndrome   . Cataract   .  Diverticulitis large intestine w/o perforation or abscess w/bleeding   . GERD (gastroesophageal reflux disease)   . Headache(784.0)    occasional   . Hypertension   . Osteopenia   . PONV (postoperative nausea and vomiting)   . Vertigo   . Vitamin D deficiency    Past Surgical History:  Procedure Laterality Date  . BREAST REDUCTION SURGERY  1982  . CARPAL TUNNEL RELEASE     left   . COLONOSCOPY    . FOOT SURGERY    . KNEE ARTHROSCOPY     right knee 2012   . OTHER SURGICAL HISTORY     spur removed top of left foot   . REDUCTION MAMMAPLASTY  1982  . TOTAL KNEE ARTHROPLASTY  04/21/2012   Procedure: TOTAL KNEE ARTHROPLASTY;  Surgeon: [name], MD;  Location: [facility];  Service: Orthopedics;  Laterality: Right;  [name] Kitchen VAGINAL HYSTERECTOMY  1977   Family History  Problem Relation Age of Onset  . Breast cancer Mother        late in life  . Diabetes Mother        late in life  . Heart disease Father   . Lung disease Father   . Colon cancer Neg Hx   . Esophageal  cancer Neg Hx   . Stomach cancer Neg Hx   . Rectal cancer Neg Hx    Allergies as of 12/11/2017   No Known Allergies     Medication List        Accurate as of 12/11/17 11:04 AM. Always use your most recent med list.          aspirin EC 81 MG tablet Take 81 mg by mouth daily.   budesonide 32 MCG/ACT nasal spray Commonly known as:  RHINOCORT AQUA Place 2 sprays into both nostrils daily.   ciclopirox 0.77 % cream Commonly known as:  LOPROX APPLY  CREAM TOPICALLY TO AFFECTED AREA TWICE DAILY   escitalopram 5 MG tablet Commonly known as:  LEXAPRO Take 1 tablet (5 mg total) by mouth daily.   fluticasone 50 MCG/ACT nasal spray Commonly known as:  FLONASE Place 2 sprays into both nostrils daily.   Garlic 017 MG Tabs Take 100 mg by mouth daily with breakfast.   montelukast 10 MG tablet Commonly known as:  SINGULAIR Take 1 tablet (10 mg total) by mouth at bedtime.   multivitamin with minerals Tabs  tablet Take 1 tablet by mouth daily with breakfast.   OMEGA 3 PO Take 1 capsule by mouth daily.   pantoprazole 40 MG tablet Commonly known as:  PROTONIX Take 40 mg by mouth daily as needed.   PROBIOTIC DAILY PO Take by mouth.   triamterene-hydrochlorothiazide 75-50 MG tablet Commonly known as:  MAXZIDE TAKE 1 TABLET BY MOUTH ONCE DAILY WITH  BREAKFAST   verapamil 120 MG tablet Commonly known as:  CALAN Take 1 tablet (120 mg total) by mouth daily.       All past medical history, surgical history, allergies, family history, immunizations andmedications were updated in the EMR today and reviewed under the history and medication portions of their EMR.     ROS: Negative, with the exception of above mentioned in HPI   Objective:  BP 120/80 (BP Location: Left Arm, Patient Position: Sitting, Cuff Size: Large)   Pulse 62   Temp 97.9 F (36.6 C) (Oral)   Wt 181 lb 6.4 oz (82.3 kg)   SpO2 97%   BMI 33.18 kg/m  Body mass index is 33.18 kg/m. Gen: Afebrile. No acute distress. Nontoxic in appearance, well developed, well nourished very pleasant caucasian female.  HENT: AT. [place]. Bilateral TM visualized and normal in appearance. MMM. Bilateral nares with erythema left, bilateral swelling and drainage present. Throat without erythema or exudates. No cough, no hoarseness, congestion present.  Eyes:Pupils Equal Round Reactive to light, Extraocular movements intact,  Conjunctiva without redness, discharge or icterus. Neck/lymp/endocrine: Supple,no lymphadenopathy CV: RRR no murmur  Chest: CTAB, no wheeze or crackles Neuro:  Normal gait. PERLA. EOMi. Alert. Oriented x3   No exam data present No results found. No results found for this or any previous visit (from the past 24 hour(s)).  Assessment/Plan: Shelly Leonard is a 71 y.o. female present for OV for  Chronic sinusitis, unspecified location Rest, hydrate.  + flonase, mucinex (DM if cough), nettie pot or nasal saline.  doxy  prescribed, take until completed.  Continue Claritin, singulair, Could not tolerate xyzal.  Start astelin.  - pt has been established with ENT for this issue.  If cough present it can last up to 6-8 weeks.  F/U 2 weeks of not improved.    Reviewed expectations re: course of current medical issues.  Discussed self-management of symptoms.  Outlined signs and symptoms indicating need  for more acute intervention.  Patient verbalized understanding and all questions were answered.  Patient received an After-Visit Summary.    No orders of the defined types were placed in this encounter.    Note is dictated utilizing voice recognition software. Although note has been proof read prior to signing, occasional typographical errors still can be missed. If any questions arise, please do not hesitate to call for verification.   electronically signed by:  Howard Pouch, DO  [place]

## 2018-02-04 ENCOUNTER — Other Ambulatory Visit: Payer: Self-pay | Admitting: Family Medicine

## 2018-02-04 DIAGNOSIS — I10 Essential (primary) hypertension: Secondary | ICD-10-CM

## 2018-03-14 ENCOUNTER — Ambulatory Visit (INDEPENDENT_AMBULATORY_CARE_PROVIDER_SITE_OTHER): Payer: Medicare HMO | Admitting: Family Medicine

## 2018-03-14 ENCOUNTER — Encounter: Payer: Self-pay | Admitting: Family Medicine

## 2018-03-14 VITALS — BP 116/79 | HR 70 | Temp 98.4°F | Resp 20 | Ht 62.0 in | Wt 179.5 lb

## 2018-03-14 DIAGNOSIS — R69 Illness, unspecified: Secondary | ICD-10-CM | POA: Diagnosis not present

## 2018-03-14 DIAGNOSIS — I10 Essential (primary) hypertension: Secondary | ICD-10-CM

## 2018-03-14 DIAGNOSIS — F4321 Adjustment disorder with depressed mood: Secondary | ICD-10-CM | POA: Insufficient documentation

## 2018-03-14 DIAGNOSIS — K219 Gastro-esophageal reflux disease without esophagitis: Secondary | ICD-10-CM | POA: Insufficient documentation

## 2018-03-14 DIAGNOSIS — F4320 Adjustment disorder, unspecified: Secondary | ICD-10-CM | POA: Insufficient documentation

## 2018-03-14 MED ORDER — MONTELUKAST SODIUM 10 MG PO TABS: 10.0000 mg | ORAL_TABLET | Freq: Every day | ORAL | 11 refills | Status: DC

## 2018-03-14 MED ORDER — VERAPAMIL HCL 120 MG PO TABS: 120.0000 mg | ORAL_TABLET | Freq: Every day | ORAL | 0 refills | Status: DC

## 2018-03-14 MED ORDER — TRIAMTERENE-HCTZ 75-50 MG PO TABS: ORAL_TABLET | ORAL | 0 refills | Status: DC

## 2018-03-14 MED ORDER — PANTOPRAZOLE SODIUM 20 MG PO TBEC: 20.0000 mg | DELAYED_RELEASE_TABLET | Freq: Every day | ORAL | 1 refills | Status: DC | PRN

## 2018-03-14 NOTE — Progress Notes (Signed)
Shelly Leonard , 1946-08-26, 72 y.o., female MRN: [id number] Patient Care Team    Relationship Specialty Notifications Start End  [name], DO PCP - General Family Medicine  08/18/15   Warden Fillers, MD Consulting Physician Ophthalmology  05/08/16   Milus Banister, MD Attending Physician Gastroenterology  05/08/16   Thornell Sartorius, MD Consulting Physician Otolaryngology  06/25/17     Chief Complaint  Patient presents with  . Hypertension  . Gastroesophageal Reflux     Subjective:  Hypertension/morbid obesity: Pt reports compliance with Maxzide 75-50 and verapamil 120 mg daily. Blood pressures ranges at home not routinely checked. Patient denies chest pain, shortness of breath or lower extremity edema. Pt takes a daily baby ASA. Pt is not prescribed statin. BMP: 05/09/2016 within normal limits CBC: 517/2017 within normal limits Lipid panel: 07/12/2015 within normal limits TSH: 04/01/2014 within normal limits Diet: Low sodium. Exercise: Routine exercise. RF: Hypertension, obesity, family history of heart disease  Gerd: Used Protonix 40 mg PRN. Rarely needed. But has noticed increase sx lately when eating spicy foods.   Grief reaction:  Doing much better, never started lexapro.  Pt presents for an OV with complaints of mental and physical reasons she just "can not" perform her jury duty.  Patient has lost her husband in January 2018 and is having difficulty with her grieving process. She has declined therapy or medication assistance in the past. She attempts to go Silver sneakers a few times a week and continues to go to church and play the piano. She does feel that she is getting as much enjoyment out of her activities that she has done prior. She is mostly feeling like she is going through the motions. She reports she did not think she was still feel like this many months out from his death. She also has a history of arthritis in bilateral knees, wrists and hands. Sitting  for long periods of time or difficult for her. She takes over-the-counter medication for comfort.   Depression screen [facility] 2/9 03/14/2018 09/27/2017 06/25/2017 05/08/2016 05/19/2015  Decreased Interest 0 0 0 0 0  Down, Depressed, Hopeless 0 3 0 0 0  PHQ - 2 Score 0 3 0 0 0  Altered sleeping 0 2 - - -  Tired, decreased energy 0 2 - - -  Change in appetite 0 1 - - -  Feeling bad or failure about yourself  0 0 - - -  Trouble concentrating 0 1 - - -  Moving slowly or fidgety/restless 0 0 - - -  Suicidal thoughts 0 0 - - -  PHQ-9 Score 0 9 - - -  Difficult doing work/chores - Somewhat difficult - - -    No Known Allergies Social History   Tobacco Use  . Smoking status: Never Smoker  . Smokeless tobacco: Never Used  Substance Use Topics  . Alcohol use: No   Past Medical History:  Diagnosis Date  . Allergy   . Anemia    low iron (hgb 8)  . Arthritis    knees and hands   . Carpal tunnel syndrome   . Cataract   . Diverticulitis large intestine w/o perforation or abscess w/bleeding   . GERD (gastroesophageal reflux disease)   . Headache(784.0)    occasional   . Hypertension   . Osteopenia   . PONV (postoperative nausea and vomiting)   . Vertigo   . Vitamin D deficiency    Past Surgical History:  Procedure  Laterality Date  . BREAST REDUCTION SURGERY  1982  . CARPAL TUNNEL RELEASE     left   . COLONOSCOPY    . FOOT SURGERY    . KNEE ARTHROSCOPY     right knee 2012   . OTHER SURGICAL HISTORY     spur removed top of left foot   . REDUCTION MAMMAPLASTY  1982  . TOTAL KNEE ARTHROPLASTY  04/21/2012   Procedure: TOTAL KNEE ARTHROPLASTY;  Surgeon: [name], MD;  Location: [facility];  Service: Orthopedics;  Laterality: Right;  [name] Kitchen VAGINAL HYSTERECTOMY  1977   Family History  Problem Relation Age of Onset  . Breast cancer Mother        late in life  . Diabetes Mother        late in life  . Heart disease Father   . Lung disease Father   . Colon cancer Neg Hx   . Esophageal  cancer Neg Hx   . Stomach cancer Neg Hx   . Rectal cancer Neg Hx    Allergies as of 03/14/2018   No Known Allergies     Medication List        Accurate as of 03/14/18  1:07 PM. Always use your most recent med list.          aspirin EC 81 MG tablet Take 81 mg by mouth daily.   azelastine 0.1 % nasal spray Commonly known as:  ASTELIN Place 2 sprays into both nostrils 2 (two) times daily. Use in each nostril as directed   fluticasone 50 MCG/ACT nasal spray Commonly known as:  FLONASE Place 2 sprays into both nostrils daily.   Garlic 161 MG Tabs Take 100 mg by mouth daily with breakfast.   montelukast 10 MG tablet Commonly known as:  SINGULAIR Take 1 tablet (10 mg total) by mouth at bedtime.   multivitamin with minerals Tabs tablet Take 1 tablet by mouth daily with breakfast.   OMEGA 3 PO Take 1 capsule by mouth daily.   pantoprazole 40 MG tablet Commonly known as:  PROTONIX Take 40 mg by mouth daily as needed.   PROBIOTIC DAILY PO Take by mouth.   triamterene-hydrochlorothiazide 75-50 MG tablet Commonly known as:  MAXZIDE TAKE 1 TABLET BY MOUTH IN THE MORNING WITH BREAKFAST needs office visit prior to anymore refills   verapamil 120 MG tablet Commonly known as:  CALAN Take 1 tablet (120 mg total) by mouth daily. Needs office visit prior to anymore refills.       All past medical history, surgical history, allergies, family history, immunizations andmedications were updated in the EMR today and reviewed under the history and medication portions of their EMR.     ROS: Negative, with the exception of above mentioned in HPI   Objective:  BP 116/79 (BP Location: Right Arm, Patient Position: Sitting, Cuff Size: Normal)   Pulse 70   Temp 98.4 F (36.9 C)   Resp 20   Ht 5\' 2"  (1.575 m)   Wt 179 lb 8 oz (81.4 kg)   SpO2 97%   BMI 32.83 kg/m  Body mass index is 32.83 kg/m. Gen: Afebrile. No acute distress. Nontoxic in appearance well developed, well  nourished, obese, very pleasant AAF.  HENT: AT. [place].  MMM. Eyes:Pupils Equal Round Reactive to light, Extraocular movements intact,  Conjunctiva without redness, discharge or icterus. Neck/lymp/endocrine: Supple,no lymphadenopathy, no thyromegaly CV: RRR no murmur, no edema, +2/4 P posterior tibialis pulses Chest: CTAB, no wheeze or crackles  Abd: Soft. NTND. BS present. no Masses palpated.  Neuro:  Normal gait. PERLA. EOMi. Alert. Oriented x3.  Psych: Normal affect, dress and demeanor. Normal speech. Normal thought content and judgment..   No exam data present No results found. No results found for this or any previous visit (from the past 24 hour(s)).  Assessment/Plan: KERIE BADGER is a 72 y.o. female present for OV for  Grief reaction with prolonged bereavement - doing better. Never started lexapro.   Essential hypertension, benign/morbid obesity - stable.  - low sodium and exercise.  - cbc, cmp, lipid panel and TSH today.  - Continue maxzide and verapamil 120; refills provided today.  - f/u 6 months.   Gastroesophageal reflux disease, esophagitis presence not specified - protonix 20 mg QD PRN.  - avoid food triggers.    Reviewed expectations re: course of current medical issues.  Discussed self-management of symptoms.  Outlined signs and symptoms indicating need for more acute intervention.  Patient verbalized understanding and all questions were answered.  Patient received an After-Visit Summary.   > 25 minutes spent with patient, >50% of time spent face to face counseling     No orders of the defined types were placed in this encounter.    Note is dictated utilizing voice recognition software. Although note has been proof read prior to signing, occasional typographical errors still can be missed. If any questions arise, please do not hesitate to call for verification.   electronically signed by:  Howard Pouch, DO  [place]

## 2018-03-14 NOTE — Patient Instructions (Signed)
I have refilled your meds for you.  Follow up in 6 months with me.  Schedule your medicare wellness (if due).    Your blood pressure looks great.   I will call you with lab results once available.  Hope you have a Happy Easter!!!   Please help [place] help you:  We are honored you have chosen [facility] for your Primary Care home. Below you will find basic instructions that you may need to access in the future. Please help [place] help you by reading the instructions, which cover many of the frequent questions we experience.   Prescription refills and request:  -In order to allow more efficient response time, please call your pharmacy for all refills. They will forward the request electronically to [place]. This allows for the quickest possible response. Request left on a nurse line can take longer to refill, since these are checked as time allows between office patients and other phone calls.  - refill request can take up to 3-5 working days to complete.  - If request is sent electronically and request is appropiate, it is usually completed in 1-2 business days.  - all patients will need to be seen routinely for all chronic medical conditions requiring prescription medications (see follow-up below). If you are overdue for follow up on your condition, you will be asked to make an appointment and we will call in enough medication to cover you until your appointment (up to 30 days).  - all controlled substances will require a face to face visit to request/refill.  - if you desire your prescriptions to go through a new pharmacy, and have an active script at original pharmacy, you will need to call your pharmacy and have scripts transferred to new pharmacy. This is completed between the pharmacy locations and not by your provider.    Results: If any images or labs were ordered, it can take up to 1 week to get results depending on the test ordered and the lab/facility running and resulting the test. -  Normal or stable results, which do not need further discussion, may be released to your mychart immediately with attached note to you. A call may not be generated for normal results. Please make certain to sign up for mychart. If you have questions on how to activate your mychart you can call the front office.  - If your results need further discussion, our office will attempt to contact you via phone, and if unable to reach you after 2 attempts, we will release your abnormal result to your mychart with instructions.  - All results will be automatically released in mychart after 1 week.  - Your provider will provide you with explanation and instruction on all relevant material in your results. Please keep in mind, results and labs may appear confusing or abnormal to the untrained eye, but it does not mean they are actually abnormal for you personally. If you have any questions about your results that are not covered, or you desire more detailed explanation than what was provided, you should make an appointment with your provider to do so.   Our office handles many outgoing and incoming calls daily. If we have not contacted you within 1 week about your results, please check your mychart to see if there is a message first and if not, then contact our office.  In helping with this matter, you help decrease call volume, and therefore allow [place] to be able to respond to patients needs more  efficiently.   Acute office visits (sick visit):  An acute visit is intended for a new problem and are scheduled in shorter time slots to allow schedule openings for patients with new problems. This is the appropriate visit to discuss a new problem. In order to provide you with excellent quality medical care with proper time for you to explain your problem, have an exam and receive treatment with instructions, these appointments should be limited to one new problem per visit. If you experience a new problem, in which you desire  to be addressed, please make an acute office visit, we save openings on the schedule to accommodate you. Please do not save your new problem for any other type of visit, let us take care of it properly and quickly for you.   Follow up visits:  Depending on your condition(s) your provider will need to see you routinely in order to provide you with quality care and prescribe medication(s). Most chronic conditions (Example: hypertension, Diabetes, depression/anxiety... etc), require visits a couple times a year. Your provider will instruct you on proper follow up for your personal medical conditions and history. Please make certain to make follow up appointments for your condition as instructed. Failing to do so could result in lapse in your medication treatment/refills. If you request a refill, and are overdue to be seen on a condition, we will always provide you with a 30 day script (once) to allow you time to schedule.    Medicare wellness (well visit): - we have a wonderful Nurse Maudie Mercury), that will meet with you and provide you will yearly medicare wellness visits. These visits should occur yearly (can not be scheduled less than 1 calendar year apart) and cover preventive health, immunizations, advance directives and screenings you are entitled to yearly through your medicare benefits. Do not miss out on your entitled benefits, this is when medicare will pay for these benefits to be ordered for you.  These are strongly encouraged by your provider and is the appropriate type of visit to make certain you are up to date with all preventive health benefits. If you have not had your medicare wellness exam in the last 12 months, please make certain to schedule one by calling the office and schedule your medicare wellness with Maudie Mercury as soon as possible.   Yearly physical (well visit):  - Adults are recommended to be seen yearly for physicals. Check with your insurance and date of your last physical, most insurances  require one calendar year between physicals. Physicals include all preventive health topics, screenings, medical exam and labs that are appropriate for gender/age and history. You may have fasting labs needed at this visit. This is a well visit (not a sick visit), new problems should not be covered during this visit (see acute visit).  - Pediatric patients are seen more frequently when they are younger. Your provider will advise you on well child visit timing that is appropriate for your their age. - This is not a medicare wellness visit. Medicare wellness exams do not have an exam portion to the visit. Some medicare companies allow for a physical, some do not allow a yearly physical. If your medicare allows a yearly physical you can schedule the medicare wellness with our nurse Maudie Mercury and have your physical with your provider after, on the same day. Please check with insurance for your full benefits.   Late Policy/No Shows:  - all new patients should arrive 15-30 minutes earlier than appointment to allow [place]  time  to  obtain all personal demographics,  insurance information and for you to complete office paperwork. - All established patients should arrive 10-15 minutes earlier than appointment time to update all information and be checked in .  - In our best efforts to run on time, if you are late for your appointment you will be asked to either reschedule or if able, we will work you back into the schedule. There will be a wait time to work you back in the schedule,  depending on availability.  - If you are unable to make it to your appointment as scheduled, please call 24 hours ahead of time to allow [place] to fill the time slot with someone else who needs to be seen. If you do not cancel your appointment ahead of time, you may be charged a no show fee.

## 2018-03-18 ENCOUNTER — Other Ambulatory Visit: Payer: Medicare HMO

## 2018-03-18 ENCOUNTER — Other Ambulatory Visit: Payer: Self-pay | Admitting: Family Medicine

## 2018-03-18 DIAGNOSIS — Z6833 Body mass index (BMI) 33.0-33.9, adult: Secondary | ICD-10-CM

## 2018-03-18 DIAGNOSIS — I10 Essential (primary) hypertension: Secondary | ICD-10-CM

## 2018-03-21 ENCOUNTER — Telehealth: Payer: Self-pay | Admitting: Family Medicine

## 2018-03-21 ENCOUNTER — Ambulatory Visit (INDEPENDENT_AMBULATORY_CARE_PROVIDER_SITE_OTHER): Payer: Medicare HMO | Admitting: Family Medicine

## 2018-03-21 ENCOUNTER — Encounter: Payer: Self-pay | Admitting: Family Medicine

## 2018-03-21 VITALS — BP 121/78 | HR 68 | Temp 97.8°F | Ht 62.0 in | Wt 177.8 lb

## 2018-03-21 DIAGNOSIS — I10 Essential (primary) hypertension: Secondary | ICD-10-CM

## 2018-03-21 DIAGNOSIS — S39012A Strain of muscle, fascia and tendon of lower back, initial encounter: Secondary | ICD-10-CM

## 2018-03-21 MED ORDER — CYCLOBENZAPRINE HCL 5 MG PO TABS: 5.0000 mg | ORAL_TABLET | Freq: Two times a day (BID) | ORAL | 0 refills | Status: DC | PRN

## 2018-03-21 MED ORDER — VERAPAMIL HCL 120 MG PO TABS: 120.0000 mg | ORAL_TABLET | Freq: Every day | ORAL | 1 refills | Status: DC

## 2018-03-21 MED ORDER — PREDNISONE 20 MG PO TABS: ORAL_TABLET | ORAL | 0 refills | Status: DC

## 2018-03-21 MED ORDER — TRIAMTERENE-HCTZ 75-50 MG PO TABS: ORAL_TABLET | ORAL | 1 refills | Status: DC

## 2018-03-21 NOTE — Telephone Encounter (Signed)
Phone call returned and message left for patient to call back. Patient also instructed to call pharmacy for refills on medications.

## 2018-03-21 NOTE — Patient Instructions (Signed)
Flexeril before bed. Prednisone taper.  Heat therapy.  Stretches daily.   I refilled your other meds for 90d w/ a refill.   If not improved in 2-4 weeks .

## 2018-03-21 NOTE — Telephone Encounter (Signed)
Copied from [place] [phone]. Topic: Quick Communication - See Telephone Encounter >> Mar 21, 2018  8:55 AM [name], NT wrote: CRM for notification. See Telephone encounter for: 03/21/18. Pt would like Dr. Raoul Pitch to update her prescriptions to 90 days supplies at her pharmacy. She states also that she doesn't want to come in for an appointment and states she needs prednisone called in for her back pain. Uses [facility] [address].

## 2018-03-21 NOTE — Progress Notes (Signed)
Shelly Leonard , Nov 19, 1946, 72 y.o., female MRN: [id number] Patient Care Team    Relationship Specialty Notifications Start End  [name], DO PCP - General Family Medicine  08/18/15   Warden Fillers, MD Consulting Physician Ophthalmology  05/08/16   Milus Banister, MD Attending Physician Gastroenterology  05/08/16   Thornell Sartorius, MD Consulting Physician Otolaryngology  06/25/17     Chief Complaint  Patient presents with  . Back Pain    Pt c/o back pain;rated 9/10, worsen with movement but better with stretching X 3 days. it affected her sleep and daily lives.. she believe her back is inflamed. try tynelol for relief.     Subjective: Pt presents for an OV with complaints of back pain 9/10 of days duration.  Associated symptoms include left lower back discomfort without radiation.  Reports she is unable to sleep or lay flat secondary to discomfort.  She is uncertain what caused her back to flare.  She does not recall an injury or doing anything in particular.  She has had a similar circumstance in the past.  Taken Tylenol for relief.  Is tried light stretches ice and heat.  Depression screen [facility] 2/9 03/14/2018 09/27/2017 06/25/2017 05/08/2016 05/19/2015  Decreased Interest 0 0 0 0 0  Down, Depressed, Hopeless 0 3 0 0 0  PHQ - 2 Score 0 3 0 0 0  Altered sleeping 0 2 - - -  Tired, decreased energy 0 2 - - -  Change in appetite 0 1 - - -  Feeling bad or failure about yourself  0 0 - - -  Trouble concentrating 0 1 - - -  Moving slowly or fidgety/restless 0 0 - - -  Suicidal thoughts 0 0 - - -  PHQ-9 Score 0 9 - - -  Difficult doing work/chores - Somewhat difficult - - -    No Known Allergies Social History   Tobacco Use  . Smoking status: Never Smoker  . Smokeless tobacco: Never Used  Substance Use Topics  . Alcohol use: No   Past Medical History:  Diagnosis Date  . Allergy   . Anemia    low iron (hgb 8)  . Arthritis    knees and hands   . Carpal tunnel  syndrome   . Cataract   . Diverticulitis large intestine w/o perforation or abscess w/bleeding   . GERD (gastroesophageal reflux disease)   . Headache(784.0)    occasional   . Hypertension   . Osteopenia   . PONV (postoperative nausea and vomiting)   . Vertigo   . Vitamin D deficiency    Past Surgical History:  Procedure Laterality Date  . BREAST REDUCTION SURGERY  1982  . CARPAL TUNNEL RELEASE     left   . COLONOSCOPY    . FOOT SURGERY    . KNEE ARTHROSCOPY     right knee 2012   . OTHER SURGICAL HISTORY     spur removed top of left foot   . REDUCTION MAMMAPLASTY  1982  . TOTAL KNEE ARTHROPLASTY  04/21/2012   Procedure: TOTAL KNEE ARTHROPLASTY;  Surgeon: [name], MD;  Location: [facility];  Service: Orthopedics;  Laterality: Right;  [name] Kitchen VAGINAL HYSTERECTOMY  1977   Family History  Problem Relation Age of Onset  . Breast cancer Mother        late in life  . Diabetes Mother        late in life  . Heart  disease Father   . Lung disease Father   . Colon cancer Neg Hx   . Esophageal cancer Neg Hx   . Stomach cancer Neg Hx   . Rectal cancer Neg Hx    Allergies as of 03/21/2018   No Known Allergies     Medication List        Accurate as of 03/21/18  3:48 PM. Always use your most recent med list.          aspirin EC 81 MG tablet Take 81 mg by mouth daily.   azelastine 0.1 % nasal spray Commonly known as:  ASTELIN Place 2 sprays into both nostrils 2 (two) times daily. Use in each nostril as directed   fluticasone 50 MCG/ACT nasal spray Commonly known as:  FLONASE Place 2 sprays into both nostrils daily.   Garlic 102 MG Tabs Take 100 mg by mouth daily with breakfast.   montelukast 10 MG tablet Commonly known as:  SINGULAIR Take 1 tablet (10 mg total) by mouth at bedtime.   multivitamin with minerals Tabs tablet Take 1 tablet by mouth daily with breakfast.   OMEGA 3 PO Take 1 capsule by mouth daily.   pantoprazole 20 MG tablet Commonly known as:   PROTONIX Take 1 tablet (20 mg total) by mouth daily as needed.   PROBIOTIC DAILY PO Take by mouth.   triamterene-hydrochlorothiazide 75-50 MG tablet Commonly known as:  MAXZIDE TAKE 1 TABLET BY MOUTH IN THE MORNING WITH BREAKFAST needs office visit prior to anymore refills   verapamil 120 MG tablet Commonly known as:  CALAN Take 1 tablet (120 mg total) by mouth daily. Needs office visit prior to anymore refills.       All past medical history, surgical history, allergies, family history, immunizations andmedications were updated in the EMR today and reviewed under the history and medication portions of their EMR.     ROS: Negative, with the exception of above mentioned in HPI   Objective:  BP 121/78 (BP Location: Right Arm, Patient Position: Sitting, Cuff Size: Large)   Pulse 68   Temp 97.8 F (36.6 C) (Oral)   Ht 5\' 2"  (1.575 m)   Wt 177 lb 12.8 oz (80.6 kg)   SpO2 98%   BMI 32.52 kg/m  Body mass index is 32.52 kg/m. Gen: Afebrile. No acute distress. Nontoxic in appearance, well developed, well nourished.  HENT: AT. [place].MMM Eyes:Pupils Equal Round Reactive to light, Extraocular movements intact,  Conjunctiva without redness, discharge or icterus. MSK: No erythema, no soft tissue swelling.  Muscle spasm left lower lumbar area.  Tender to palpation over this area.  Restricted range of motion secondary to discomfort.  Vascular intact distally. Skin: No rashes, purpura or petechiae.  Neuro: sLower guarded gait.[name] Kitchen PERLA. EOMi. Alert. Oriented x3  No exam data present No results found. No results found for this or any previous visit (from the past 24 hour(s)).  Assessment/Plan: JEANNETTA CERUTTI is a 72 y.o. female present for OV for  Essential hypertension, benign Refills had not be sent in 90d last visit. Corrected today.   Low back strain, initial encounter Flexeril 5 mg nightly, can take 1 additional dose if not driving during the day. Prednisone  taper. Continue heat  therapy and stretches as tolerated. Follow-up 2-4 weeks if not improved.   Reviewed expectations re: course of current medical issues.  Discussed self-management of symptoms.  Outlined signs and symptoms indicating need for more acute intervention.  Patient verbalized understanding and all  questions were answered.  Patient received an After-Visit Summary.    No orders of the defined types were placed in this encounter.    Note is dictated utilizing voice recognition software. Although note has been proof read prior to signing, occasional typographical errors still can be missed. If any questions arise, please do not hesitate to call for verification.   electronically signed by:  Howard Pouch, DO  [place]

## 2018-03-24 ENCOUNTER — Other Ambulatory Visit (INDEPENDENT_AMBULATORY_CARE_PROVIDER_SITE_OTHER): Payer: Medicare HMO

## 2018-03-24 DIAGNOSIS — I10 Essential (primary) hypertension: Secondary | ICD-10-CM

## 2018-03-24 DIAGNOSIS — Z6833 Body mass index (BMI) 33.0-33.9, adult: Secondary | ICD-10-CM

## 2018-03-24 LAB — CBC WITH DIFFERENTIAL/PLATELET
Basophils Absolute: 0 10*3/uL (ref 0.0–0.1)
Basophils Relative: 0.4 % (ref 0.0–3.0)
EOS PCT: 0 % (ref 0.0–5.0)
Eosinophils Absolute: 0 10*3/uL (ref 0.0–0.7)
HEMATOCRIT: 38.2 % (ref 36.0–46.0)
Hemoglobin: 12.5 g/dL (ref 12.0–15.0)
LYMPHS ABS: 2 10*3/uL (ref 0.7–4.0)
Lymphocytes Relative: 20.1 % (ref 12.0–46.0)
MCHC: 32.8 g/dL (ref 30.0–36.0)
MCV: 86.3 fl (ref 78.0–100.0)
MONOS PCT: 5 % (ref 3.0–12.0)
Monocytes Absolute: 0.5 10*3/uL (ref 0.1–1.0)
Neutro Abs: 7.3 10*3/uL (ref 1.4–7.7)
Neutrophils Relative %: 74.5 % (ref 43.0–77.0)
PLATELETS: 287 10*3/uL (ref 150.0–400.0)
RBC: 4.43 Mil/uL (ref 3.87–5.11)
RDW: 15.2 % (ref 11.5–15.5)
WBC: 9.8 10*3/uL (ref 4.0–10.5)

## 2018-03-24 LAB — TSH: TSH: 0.28 u[IU]/mL — ABNORMAL LOW (ref 0.35–4.50)

## 2018-03-24 LAB — COMPREHENSIVE METABOLIC PANEL
ALK PHOS: 39 U/L (ref 39–117)
ALT: 16 U/L (ref 0–35)
AST: 16 U/L (ref 0–37)
Albumin: 3.9 g/dL (ref 3.5–5.2)
BUN: 18 mg/dL (ref 6–23)
CO2: 28 meq/L (ref 19–32)
Calcium: 10.9 mg/dL — ABNORMAL HIGH (ref 8.4–10.5)
Chloride: 100 mEq/L (ref 96–112)
Creatinine, Ser: 0.53 mg/dL (ref 0.40–1.20)
GFR: 146.48 mL/min (ref >60.00)
GLUCOSE: 113 mg/dL — AB (ref 70–99)
Potassium: 4.4 mEq/L (ref 3.5–5.1)
Sodium: 134 mEq/L — ABNORMAL LOW (ref 135–145)
TOTAL PROTEIN: 7.7 g/dL (ref 6.0–8.3)
Total Bilirubin: 0.4 mg/dL (ref 0.2–1.2)

## 2018-03-24 LAB — LIPID PANEL
CHOL/HDL RATIO: 3
Cholesterol: 179 mg/dL (ref 0–200)
HDL: 61.2 mg/dL (ref >39.00)
LDL Cholesterol: 106 mg/dL — ABNORMAL HIGH (ref 0–99)
NONHDL: 118.14
Triglycerides: 61 mg/dL (ref 0.0–149.0)
VLDL: 12.2 mg/dL (ref 0.0–40.0)

## 2018-04-22 DIAGNOSIS — H43811 Vitreous degeneration, right eye: Secondary | ICD-10-CM | POA: Diagnosis not present

## 2018-04-22 DIAGNOSIS — H2513 Age-related nuclear cataract, bilateral: Secondary | ICD-10-CM | POA: Diagnosis not present

## 2018-04-22 DIAGNOSIS — H0289 Other specified disorders of eyelid: Secondary | ICD-10-CM | POA: Diagnosis not present

## 2018-04-22 DIAGNOSIS — H43812 Vitreous degeneration, left eye: Secondary | ICD-10-CM | POA: Diagnosis not present

## 2018-04-22 DIAGNOSIS — H16223 Keratoconjunctivitis sicca, not specified as Sjogren's, bilateral: Secondary | ICD-10-CM | POA: Diagnosis not present

## 2018-04-22 DIAGNOSIS — H10413 Chronic giant papillary conjunctivitis, bilateral: Secondary | ICD-10-CM | POA: Diagnosis not present

## 2018-05-02 ENCOUNTER — Ambulatory Visit: Payer: Medicare HMO | Admitting: Family Medicine

## 2018-05-02 DIAGNOSIS — Z0289 Encounter for other administrative examinations: Secondary | ICD-10-CM

## 2018-05-09 ENCOUNTER — Telehealth: Payer: Self-pay | Admitting: Family Medicine

## 2018-05-09 ENCOUNTER — Encounter: Payer: Self-pay | Admitting: Family Medicine

## 2018-05-09 ENCOUNTER — Other Ambulatory Visit: Payer: Self-pay | Admitting: Family Medicine

## 2018-05-09 ENCOUNTER — Ambulatory Visit (INDEPENDENT_AMBULATORY_CARE_PROVIDER_SITE_OTHER): Payer: Medicare HMO | Admitting: Family Medicine

## 2018-05-09 DIAGNOSIS — E2839 Other primary ovarian failure: Secondary | ICD-10-CM

## 2018-05-09 DIAGNOSIS — E559 Vitamin D deficiency, unspecified: Secondary | ICD-10-CM

## 2018-05-09 DIAGNOSIS — Z1231 Encounter for screening mammogram for malignant neoplasm of breast: Secondary | ICD-10-CM

## 2018-05-09 DIAGNOSIS — R7989 Other specified abnormal findings of blood chemistry: Secondary | ICD-10-CM

## 2018-05-09 NOTE — Patient Instructions (Signed)
We will collect labs today on your thyroid and parathyroid, as well as calcium and vit d. We will call with results once we get them and discuss and further evaluation if needed.     Hyperthyroidism Hyperthyroidism is when the thyroid is too active (overactive). Your thyroid is a large gland that is located in your neck. The thyroid helps to control how your body uses food (metabolism). When your thyroid is overactive, it produces too much of a hormone called thyroxine. What are the causes? Causes of hyperthyroidism may include:  Graves disease. This is when your immune system attacks the thyroid gland. This is the most common cause.  Inflammation of the thyroid gland.  Tumor in the thyroid gland or somewhere else.  Excessive use of thyroid medicines, including: ? Prescription thyroid supplement. ? Herbal supplements that mimic thyroid hormones.  Solid or fluid-filled lumps within your thyroid gland (thyroid nodules).  Excessive ingestion of iodine.  What increases the risk?  Being female.  Having a family history of thyroid conditions. What are the signs or symptoms? Signs and symptoms of hyperthyroidism may include:  Nervousness.  Inability to tolerate heat.  Unexplained weight loss.  Diarrhea.  Change in the texture of hair or skin.  Heart skipping beats or making extra beats.  Rapid heart rate.  Loss of menstruation.  Shaky hands.  Fatigue.  Restlessness.  Increased appetite.  Sleep problems.  Enlarged thyroid gland or nodules.  How is this diagnosed? Diagnosis of hyperthyroidism may include:  Medical history and physical exam.  Blood tests.  Ultrasound tests.  How is this treated? Treatment may include:  Medicines to control your thyroid.  Surgery to remove your thyroid.  Radiation therapy.  Follow these instructions at home:  Take medicines only as directed by your health care provider.  Do not use any tobacco products, including  cigarettes, chewing tobacco, or electronic cigarettes. If you need help quitting, ask your health care provider.  Do not exercise or do physical activity until your health care provider approves.  Keep all follow-up appointments as directed by your health care provider. This is important. Contact a health care provider if:  Your symptoms do not get better with treatment.  You have fever.  You are taking thyroid replacement medicine and you: ? Have depression. ? Feel mentally and physically slow. ? Have weight gain. Get help right away if:  You have decreased alertness or a change in your awareness.  You have abdominal pain.  You feel dizzy.  You have a rapid heartbeat.  You have an irregular heartbeat. This information is not intended to replace advice given to you by your health care provider. Make sure you discuss any questions you have with your health care provider. Document Released: 12/10/2005 Document Revised: 05/10/2016 Document Reviewed: 04/27/2014 Elsevier Interactive Patient Education  2018 [employer].

## 2018-05-09 NOTE — Telephone Encounter (Signed)
Ok to place order 

## 2018-05-09 NOTE — Progress Notes (Signed)
Shelly Leonard , 06-27-1946, 72 y.o., female MRN: [id number] Patient Care Team    Relationship Specialty Notifications Start End  [name], DO PCP - General Family Medicine  08/18/15   Warden Fillers, MD Consulting Physician Ophthalmology  05/08/16   Milus Banister, MD Attending Physician Gastroenterology  05/08/16   Thornell Sartorius, MD Consulting Physician Otolaryngology  06/25/17     Chief Complaint  Patient presents with  . Follow-up    abnormal labs, recheck TSH and calcium level     Subjective:   Hypercalcemia and abnormal TSH:  Pt presents today to discuss in detail her abnormal labs and have the labs retested as recommended. 03/24/2018 TSH 0.28, calcium 10.9. Na 134. Her sodium has been mildly low in the past. Her thyroid and calcium have been normal until 03/24/2018. She is not on calcium supplement, nor has been. She has a h/o osteopenia and vit d deficiency. She has not been taking vit d. She is on a multivitamin. She denies weight change, hot flashes, anxiety or diarrhea.   Depression screen [facility] 2/9 03/14/2018 09/27/2017 06/25/2017 05/08/2016 05/19/2015  Decreased Interest 0 0 0 0 0  Down, Depressed, Hopeless 0 3 0 0 0  PHQ - 2 Score 0 3 0 0 0  Altered sleeping 0 2 - - -  Tired, decreased energy 0 2 - - -  Change in appetite 0 1 - - -  Feeling bad or failure about yourself  0 0 - - -  Trouble concentrating 0 1 - - -  Moving slowly or fidgety/restless 0 0 - - -  Suicidal thoughts 0 0 - - -  PHQ-9 Score 0 9 - - -  Difficult doing work/chores - Somewhat difficult - - -    No Known Allergies Social History   Tobacco Use  . Smoking status: Never Smoker  . Smokeless tobacco: Never Used  Substance Use Topics  . Alcohol use: No   Past Medical History:  Diagnosis Date  . Allergy   . Anemia    low iron (hgb 8)  . Arthritis    knees and hands   . Carpal tunnel syndrome   . Cataract   . Diverticulitis large intestine w/o perforation or abscess w/bleeding     . GERD (gastroesophageal reflux disease)   . Headache(784.0)    occasional   . Hypertension   . Osteopenia   . PONV (postoperative nausea and vomiting)   . Vertigo   . Vitamin D deficiency    Past Surgical History:  Procedure Laterality Date  . BREAST REDUCTION SURGERY  1982  . CARPAL TUNNEL RELEASE     left   . COLONOSCOPY    . FOOT SURGERY    . KNEE ARTHROSCOPY     right knee 2012   . OTHER SURGICAL HISTORY     spur removed top of left foot   . REDUCTION MAMMAPLASTY  1982  . TOTAL KNEE ARTHROPLASTY  04/21/2012   Procedure: TOTAL KNEE ARTHROPLASTY;  Surgeon: [name], MD;  Location: [facility];  Service: Orthopedics;  Laterality: Right;  Shelly Leonard Kitchen VAGINAL HYSTERECTOMY  1977   Family History  Problem Relation Age of Onset  . Breast cancer Mother        late in life  . Diabetes Mother        late in life  . Heart disease Father   . Lung disease Father   . Colon cancer Neg Hx   .  Esophageal cancer Neg Hx   . Stomach cancer Neg Hx   . Rectal cancer Neg Hx    Allergies as of 05/09/2018   No Known Allergies     Medication List        Accurate as of 05/09/18  1:29 PM. Always use your most recent med list.          aspirin EC 81 MG tablet Take 81 mg by mouth daily.   azelastine 0.1 % nasal spray Commonly known as:  ASTELIN Place 2 sprays into both nostrils 2 (two) times daily. Use in each nostril as directed   cyclobenzaprine 5 MG tablet Commonly known as:  FLEXERIL Take 1 tablet (5 mg total) by mouth 2 (two) times daily as needed for muscle spasms.   fluticasone 50 MCG/ACT nasal spray Commonly known as:  FLONASE Place 2 sprays into both nostrils daily.   Garlic 440 MG Tabs Take 100 mg by mouth daily with breakfast.   montelukast 10 MG tablet Commonly known as:  SINGULAIR Take 1 tablet (10 mg total) by mouth at bedtime.   multivitamin with minerals Tabs tablet Take 1 tablet by mouth daily with breakfast.   OMEGA 3 PO Take 1 capsule by mouth daily.    pantoprazole 20 MG tablet Commonly known as:  PROTONIX Take 1 tablet (20 mg total) by mouth daily as needed.   PROBIOTIC DAILY PO Take by mouth.   triamterene-hydrochlorothiazide 75-50 MG tablet Commonly known as:  MAXZIDE TAKE 1 TABLET BY MOUTH IN THE MORNING WITH BREAKFAST needs office visit prior to anymore refills   verapamil 120 MG tablet Commonly known as:  CALAN Take 1 tablet (120 mg total) by mouth daily. Needs office visit prior to anymore refills.       All past medical history, surgical history, allergies, family history, immunizations andmedications were updated in the EMR today and reviewed under the history and medication portions of their EMR.     ROS: Negative, with the exception of above mentioned in HPI   Objective:  BP 116/79 (BP Location: Right Arm, Patient Position: Sitting, Cuff Size: Large)   Pulse 62   Temp 98.1 F (36.7 C)   Resp 20   Ht 5\' 2"  (1.575 m)   Wt 180 lb 8 oz (81.9 kg)   SpO2 96%   BMI 33.01 kg/m  Body mass index is 33.01 kg/m. Gen: Afebrile. No acute distress. Nontoxic in appearance, well developed, well nourished.  HENT: AT. [place].  MMM Eyes:Pupils Equal Round Reactive to light, Extraocular movements intact,  Conjunctiva without redness, discharge or icterus. Neck/lymp/endocrine: Supple,no lymphadenopathy, no thyromegaly, no nodules or masses palpable. CV: RRR  Skin: no rashes, purpura or petechiae.  Neuro:  Normal gait. PERLA. EOMi. Alert. Oriented x3  No exam data present No results found. No results found for this or any previous visit (from the past 24 hour(s)).  Assessment/Plan: BRAZIL Leonard is a 72 y.o. female present for OV for  Hypercalcemia/Abnormal serum thyroid stimulating hormone (TSH) level - Discussed the abnormal lab results in detail with her today. She does not seem to have any symptoms and thyroid exam today is normal. She has not been on calcium supplement for a very long time.  - PTH, Intact and Calcium -  TSH - T3, free - T4, free - Thyrotropin receptor autoabs - Vitamin D (25 hydroxy) - If abnormal US thyroid will be ordered with endocrine referral - F/U dependent on lab results.     Reviewed  expectations re: course of current medical issues.  Discussed self-management of symptoms.  Outlined signs and symptoms indicating need for more acute intervention.  Patient verbalized understanding and all questions were answered.  Patient received an After-Visit Summary.    No orders of the defined types were placed in this encounter.    Note is dictated utilizing voice recognition software. Although note has been proof read prior to signing, occasional typographical errors still can be missed. If any questions arise, please do not hesitate to call for verification.   electronically signed by:  Howard Pouch, DO  [place]

## 2018-05-09 NOTE — Telephone Encounter (Signed)
Order for Bone Density placed

## 2018-05-09 NOTE — Telephone Encounter (Signed)
Patient requesting to schedule an appt for bone density exam on 07/01/18, at the same time she is already scheduled to have her mammogram done.  Per staff at [facility], they are unable to schedule bone density until order has been placed.   Please enter order for bone density so appt can be made.

## 2018-05-12 LAB — TSH: TSH: 0.4 mIU/L (ref 0.40–4.50)

## 2018-05-12 LAB — TIQ-NTM

## 2018-05-12 LAB — THYROTROPIN RECEPTOR AUTOABS

## 2018-05-12 LAB — PTH, INTACT AND CALCIUM
Calcium: 10.7 mg/dL — ABNORMAL HIGH (ref 8.6–10.4)
PTH: 54 pg/mL (ref 14–64)

## 2018-05-12 LAB — VITAMIN D 25 HYDROXY (VIT D DEFICIENCY, FRACTURES): VIT D 25 HYDROXY: 30 ng/mL (ref 30–100)

## 2018-05-12 LAB — T3, FREE: T3, Free: 3 pg/mL (ref 2.3–4.2)

## 2018-05-12 LAB — T4, FREE: FREE T4: 1 ng/dL (ref 0.8–1.8)

## 2018-05-13 ENCOUNTER — Telehealth: Payer: Self-pay

## 2018-05-13 ENCOUNTER — Telehealth: Payer: Self-pay | Admitting: Family Medicine

## 2018-05-13 DIAGNOSIS — R7989 Other specified abnormal findings of blood chemistry: Secondary | ICD-10-CM

## 2018-05-13 NOTE — Telephone Encounter (Addendum)
Patient called back and states she would like [place] to send the order for her [place] to [facility] since its closer for her. She would like to be called to arrange date not just scheduled.

## 2018-05-13 NOTE — Telephone Encounter (Signed)
Left detailed message with results and instructions on patient voice mail per DPR 

## 2018-05-13 NOTE — Telephone Encounter (Signed)
Please inform patient the following information: Her thyroid is at the very lowest level of "normal" right now. Her calcium level improved, but still abnormal range.   I would like to proceed with Thyroid/Neck US to inspect for any nodules that could possible be causing condition.  As soon as she is called to schedule the [place], she is to call here and make an appt 2 days after [place] date  to go over results and consider further management of hypercalcemia.  Placed [place] for GI- [place].  Location can be changed to accommodate pt needs and scheduling.  In the meantime, I encouraged her to drink at least 80 ounces of water a day.

## 2018-05-13 NOTE — Telephone Encounter (Signed)
Copied from [place] [id number]. Topic: Inquiry >> May 13, 2018  4:03 PM Conception Chancy, NT wrote: Reason for CRM: patient called very aggravated that we are at the [facility] answering the phones in [place] and not the actual office. States that we mess all of her stuff up and she does not want to go through [place]. Patient did not want to give me her date of birth to pull her chart up, she finally yelled it at me and stated she wanted the office to call her and she only wants to speak to the office. Please contact the patient.

## 2018-05-14 ENCOUNTER — Other Ambulatory Visit: Payer: Self-pay | Admitting: Family Medicine

## 2018-05-14 DIAGNOSIS — R7989 Other specified abnormal findings of blood chemistry: Secondary | ICD-10-CM

## 2018-05-14 NOTE — Telephone Encounter (Signed)
Spoke with patient yesterday see previous phone note dated 05/13/18

## 2018-05-14 NOTE — Telephone Encounter (Signed)
[facility] will be re-ordering the [place] so that it will communicate with [facility]. They will contact patient to schedule.

## 2018-05-20 ENCOUNTER — Ambulatory Visit (HOSPITAL_BASED_OUTPATIENT_CLINIC_OR_DEPARTMENT_OTHER)
Admission: RE | Admit: 2018-05-20 | Discharge: 2018-05-20 | Disposition: A | Discharge status: Home / Self Care | Payer: Medicare HMO | Source: Ambulatory Visit | Attending: Family Medicine | Admitting: Family Medicine

## 2018-05-20 DIAGNOSIS — E042 Nontoxic multinodular goiter: Secondary | ICD-10-CM | POA: Diagnosis not present

## 2018-05-20 DIAGNOSIS — R7989 Other specified abnormal findings of blood chemistry: Secondary | ICD-10-CM | POA: Diagnosis not present

## 2018-05-21 ENCOUNTER — Telehealth: Payer: Self-pay | Admitting: Family Medicine

## 2018-05-21 DIAGNOSIS — E041 Nontoxic single thyroid nodule: Secondary | ICD-10-CM | POA: Insufficient documentation

## 2018-05-21 NOTE — Telephone Encounter (Signed)
Please inform patient the following information: Her thyroid US did show evidence of multiple nodules. Nodules do not necessarily mean something bad, or cancer. However, there are some that need further testing to make sure and she has one nodule that does need further testing by biopsy.  She also has a small nodule near her right parathyroid (next to thyroid). These may be causing the increase in calcium. I have referred her to endocrinology, which will address all of the above and I have also ordered the thyroid biopsy and they will call to schedule both.

## 2018-05-21 NOTE — Telephone Encounter (Signed)
Left detailed message with results and instructions on patient voice mail per DPR 

## 2018-05-21 NOTE — Telephone Encounter (Signed)
Change her appt to 3-4 days after biopsy completed and we will review all information then.

## 2018-05-21 NOTE — Telephone Encounter (Signed)
Left detailed message with  Instructions to schedule appt 3-4 days after thyroid biopsy. on patient voice mail per [facility]

## 2018-05-21 NOTE — Telephone Encounter (Signed)
Patient called back wanting to know if she needs to still be seen on Monday.  I said per information, I would say no but I told her that if she does still need an appointment that we will call her back.  Please advise.

## 2018-05-21 NOTE — Telephone Encounter (Unsigned)
Copied from [place] [id number]. Topic: Quick Communication - See Telephone Encounter >> May 21, 2018  1:57 PM Hewitt Shorts wrote: Pt is preferring to be referred [facility] [address]  if there is a endocrinologist it is closer to her house and she would prefer that the office give her the names of the providers she is being referred to so that she can work around her schedule -best number [phone]    Please make sure that if Dr. Raoul Pitch does not want to see her on Monday that it gets cancelled and she is not charged a fee

## 2018-05-22 ENCOUNTER — Other Ambulatory Visit: Payer: Self-pay

## 2018-05-22 NOTE — Telephone Encounter (Signed)
noted 

## 2018-05-22 NOTE — Telephone Encounter (Signed)
Gave patient phone #'s for [place] and [facility]. Patient would like to schedule her own appointments.

## 2018-05-26 ENCOUNTER — Ambulatory Visit: Payer: Medicare HMO | Admitting: Family Medicine

## 2018-06-17 ENCOUNTER — Ambulatory Visit
Admission: RE | Admit: 2018-06-17 | Discharge: 2018-06-17 | Disposition: A | Discharge status: Home / Self Care | Payer: Medicare HMO | Source: Ambulatory Visit | Attending: Family Medicine | Admitting: Family Medicine

## 2018-06-17 ENCOUNTER — Other Ambulatory Visit (HOSPITAL_COMMUNITY)
Admission: RE | Admit: 2018-06-17 | Discharge: 2018-06-17 | Disposition: A | Discharge status: Home / Self Care | Payer: Medicare HMO | Source: Ambulatory Visit | Attending: Student | Admitting: Student

## 2018-06-17 DIAGNOSIS — E041 Nontoxic single thyroid nodule: Secondary | ICD-10-CM

## 2018-06-30 ENCOUNTER — Other Ambulatory Visit: Payer: Self-pay | Admitting: Family Medicine

## 2018-06-30 DIAGNOSIS — B356 Tinea cruris: Secondary | ICD-10-CM

## 2018-06-30 NOTE — Progress Notes (Addendum)
Subjective:   Shelly Leonard is a 72 y.o. female who presents for Medicare Annual (Subsequent) preventive examination.  Review of Systems:  No ROS.  Medicare Wellness Visit. Additional risk factors are reflected in the social history.  Cardiac Risk Factors include: advanced age (>9men, >46 women);hypertension;family history of premature cardiovascular disease;obesity (BMI >30kg/m2)   Sleep patterns: Sleeps 8 hours with several interruptions.   Home Safety/Smoke Alarms: Feels safe in home. Smoke alarms in place.  Living environment; residence and Firearm Safety: Lives alone in 2 story home, uses rail. Two brothers live in neighborhood.  Seat Belt Safety/Bike Helmet: Wears seat belt.   Female:   Pap-N/A      Mammo-06/25/2017, BI-RADS CATEGORY  1: Negative. Scheduled today.       Dexa scan-06/14/2016, Osteopenia. Scheduled today.  CCS-Colonoscopy 05/29/2016, polyp. Recall 10 years.        Objective:     Vitals: BP 130/68 (BP Location: Left Arm, Patient Position: Sitting, Cuff Size: Large)   Pulse 67   Ht 5\' 2"  (1.575 m)   Wt 180 lb (81.6 kg)   SpO2 97%   BMI 32.92 kg/m   Body mass index is 32.92 kg/m.  Advanced Directives 07/01/2018 06/25/2017 08/09/2016 05/29/2016 05/08/2016 02/13/2015 04/24/2012  Does Patient Have a Medical Advance Directive? No No No No No No Patient does not have advance directive  Would patient like information on creating a medical advance directive? Yes (MAU/Ambulatory/Procedural Areas - Information given) Yes (MAU/Ambulatory/Procedural Areas - Information given) - - Yes - Educational materials given No - patient declined information -  Pre-existing out of facility DNR order (yellow form or pink MOST form) - - - - - - No    Tobacco Social History   Tobacco Use  Smoking Status Never Smoker  Smokeless Tobacco Never Used     Counseling given: Not Answered   Past Medical History:  Diagnosis Date  . Allergy   . Anemia    low iron (hgb 8)  . Arthritis     knees and hands   . Carpal tunnel syndrome   . Cataract   . Diverticulitis large intestine w/o perforation or abscess w/bleeding   . GERD (gastroesophageal reflux disease)   . Headache(784.0)    occasional   . Hypertension   . Osteopenia   . PONV (postoperative nausea and vomiting)   . Vertigo   . Vitamin D deficiency    Past Surgical History:  Procedure Laterality Date  . BREAST REDUCTION SURGERY  1982  . CARPAL TUNNEL RELEASE     left   . COLONOSCOPY    . FOOT SURGERY    . KNEE ARTHROSCOPY     right knee 2012   . OTHER SURGICAL HISTORY     spur removed top of left foot   . REDUCTION MAMMAPLASTY  1982  . TOTAL KNEE ARTHROPLASTY  04/21/2012   Procedure: TOTAL KNEE ARTHROPLASTY;  Surgeon: [name], MD;  Location: [facility];  Service: Orthopedics;  Laterality: Right;  [name] Kitchen VAGINAL HYSTERECTOMY  1977   Family History  Problem Relation Age of Onset  . Breast cancer Mother        late in life  . Diabetes Mother        late in life  . Heart disease Father   . Lung disease Father   . Colon cancer Neg Hx   . Esophageal cancer Neg Hx   . Stomach cancer Neg Hx   . Rectal cancer Neg Hx  Social History   Socioeconomic History  . Marital status: Widowed    Spouse name: Not on file  . Number of children: 2  . Years of education: Not on file  . Highest education level: Not on file  Occupational History  . Not on file  Social Needs  . Financial resource strain: Not on file  . Food insecurity:    Worry: Not on file    Inability: Not on file  . Transportation needs:    Medical: Not on file    Non-medical: Not on file  Tobacco Use  . Smoking status: Never Smoker  . Smokeless tobacco: Never Used  Substance and Sexual Activity  . Alcohol use: No  . Drug use: No  . Sexual activity: Yes    Birth control/protection: Post-menopausal  Lifestyle  . Physical activity:    Days per week: Not on file    Minutes per session: Not on file  . Stress: Not on file    Relationships  . Social connections:    Talks on phone: Not on file    Gets together: Not on file    Attends religious service: Not on file    Active member of club or organization: Not on file    Attends meetings of clubs or organizations: Not on file    Relationship status: Not on file  Other Topics Concern  . Not on file  Social History Narrative   Shelly Leonard is widowed (12/2016).    She enjoys music and is involved with her church.    Outpatient Encounter Medications as of 07/01/2018  Medication Sig  . aspirin EC 81 MG tablet Take 81 mg by mouth daily.  [name] Kitchen azelastine (ASTELIN) 0.1 % nasal spray Place 2 sprays into both nostrils 2 (two) times daily. Use in each nostril as directed  . Cholecalciferol (VITAMIN D PO) Take by mouth.  . ciclopirox (LOPROX) 0.77 % cream APPLY  CREAM TOPICALLY TO AFFECTED AREA TWICE DAILY  . cyclobenzaprine (FLEXERIL) 5 MG tablet Take 1 tablet (5 mg total) by mouth 2 (two) times daily as needed for muscle spasms.  . fluticasone (FLONASE) 50 MCG/ACT nasal spray Place 2 sprays into both nostrils daily. (Patient taking differently: Place 2 sprays into both nostrils daily as needed. )  . Garlic 366 MG TABS Take 100 mg by mouth daily with breakfast.   . montelukast (SINGULAIR) 10 MG tablet Take 1 tablet (10 mg total) by mouth at bedtime.  . Multiple Vitamin (MULITIVITAMIN WITH MINERALS) TABS Take 1 tablet by mouth daily with breakfast.  . Omega-3 Fatty Acids (OMEGA 3 PO) Take 1 capsule by mouth daily.  . pantoprazole (PROTONIX) 20 MG tablet Take 1 tablet (20 mg total) by mouth daily as needed.  . Probiotic Product (PROBIOTIC DAILY PO) Take by mouth.  . triamterene-hydrochlorothiazide (MAXZIDE) 75-50 MG tablet TAKE 1 TABLET BY MOUTH IN THE MORNING WITH BREAKFAST needs office visit prior to anymore refills  . verapamil (CALAN) 120 MG tablet Take 1 tablet (120 mg total) by mouth daily. Needs office visit prior to anymore refills.   No facility-administered encounter  medications on file as of 07/01/2018.     Activities of Daily Living In your present state of health, do you have any difficulty performing the following activities: 07/01/2018  Hearing? N  Vision? N  Difficulty concentrating or making decisions? N  Walking or climbing stairs? N  Dressing or bathing? N  Doing errands, shopping? N  Preparing Food and eating ? N  Using the Toilet? N  In the past six months, have you accidently leaked urine? N  Do you have problems with loss of bowel control? N  Managing your Medications? N  Managing your Finances? N  Housekeeping or managing your Housekeeping? N  Some recent data might be hidden    Patient Care Team: [name], DO as PCP - General (Family Medicine) Warden Fillers, MD as Consulting Physician (Ophthalmology) Milus Banister, MD as Attending Physician (Gastroenterology) Thornell Sartorius, MD as Consulting Physician (Otolaryngology) [place] [place] (Specialist)    Assessment:   This is a routine wellness examination for Shelly Leonard.  Exercise Activities and Dietary recommendations Current Exercise Habits: Home exercise routine, Type of exercise: strength training/weights;calisthenics, Time (Minutes): 30, Frequency (Times/Week): 6, Weekly Exercise (Minutes/Week): 180, Exercise limited by: None identified   Diet (meal preparation, eat out, water intake, caffeinated beverages, dairy products, fruits and vegetables): Drinks water, lemonade, tea and coffee.   Breakfast: cottage cheese/fruit; boiled eggs; "healthy grits" Lunch: fruit bar; sardines/cracker; applesauce Dinner: vegetables with protein  Goals    . Weight (lb) < 160 lb (72.6 kg)     Lose weight by increasing activity and eating healthier.    . Weight (lb) < 170 lb (77.1 kg)     Lose weight by continuing to stay active.        Fall Risk Fall Risk  07/01/2018 06/25/2017 05/08/2016 05/19/2015  Falls in the past year? No No No Yes  Number falls in past yr: -  - - 2 or more  Injury with Fall? - - - Yes    Depression Screen PHQ 2/9 Scores 07/01/2018 03/14/2018 09/27/2017 06/25/2017  PHQ - 2 Score 0 0 3 0  PHQ- 9 Score - 0 9 -     Cognitive Function MMSE - Mini Mental State Exam 07/01/2018  Orientation to time 5  Orientation to Place 5  Registration 3  Attention/ Calculation 5  Recall 3  Language- name 2 objects 2  Language- repeat 1  Language- follow 3 step command 3  Language- read & follow direction 1  Write a sentence 1  Copy design 1  Total score 30        Immunization History  Administered Date(s) Administered  . Pneumococcal Conjugate-13 04/01/2014  . Pneumococcal Polysaccharide-23 11/29/2015  . Td 08/14/2011  . Zoster 01/20/2013   Has Shingrix Rx from last year, currently does not plan to fill d/t cost.   Screening Tests Health Maintenance  Topic Date Due  . DEXA SCAN  06/14/2018  . MAMMOGRAM  06/25/2018  . INFLUENZA VACCINE  11/21/2018 (Originally 07/24/2018)  . TETANUS/TDAP  08/13/2021  . COLONOSCOPY  05/29/2026  . Hepatitis C Screening  Completed  . PNA vac Low Risk Adult  Addressed       Plan:    Bring a copy of your living will and/or healthcare power of attorney to your next office visit.  Continue doing brain stimulating activities (puzzles, reading, adult coloring books, staying active) to keep memory sharp.   I have personally reviewed and noted the following in the patient's chart:   . Medical and social history . Use of alcohol, tobacco or illicit drugs  . Current medications and supplements . Functional ability and status . Nutritional status . Physical activity . Advanced directives . List of other physicians . Hospitalizations, surgeries, and ER visits in previous 12 months . Vitals . Screenings to include cognitive, depression, and falls . Referrals and appointments  In addition,  I have reviewed and discussed with patient certain preventive protocols, quality metrics, and best practice  recommendations. A written personalized care plan for preventive services as well as general preventive health recommendations were provided to patient.     Gerilyn Nestle, RN  07/01/2018  PCP Notes: -Pt with no issues, will call for f/u appt with PCP.   Medical screening examination/treatment/procedure(s) were performed by non-physician practitioner and as supervising physician I was immediately available for consultation/collaboration.  I agree with above assessment and plan.  Electronically Signed by: Howard Pouch, DO [facility]

## 2018-07-01 ENCOUNTER — Encounter (HOSPITAL_BASED_OUTPATIENT_CLINIC_OR_DEPARTMENT_OTHER): Payer: Self-pay

## 2018-07-01 ENCOUNTER — Ambulatory Visit (HOSPITAL_BASED_OUTPATIENT_CLINIC_OR_DEPARTMENT_OTHER)
Admission: RE | Admit: 2018-07-01 | Discharge: 2018-07-01 | Disposition: A | Discharge status: Home / Self Care | Payer: Medicare HMO | Source: Ambulatory Visit | Attending: Family Medicine | Admitting: Family Medicine

## 2018-07-01 ENCOUNTER — Ambulatory Visit (INDEPENDENT_AMBULATORY_CARE_PROVIDER_SITE_OTHER): Payer: Medicare HMO

## 2018-07-01 ENCOUNTER — Other Ambulatory Visit: Payer: Self-pay

## 2018-07-01 VITALS — BP 130/68 | HR 67 | Ht 62.0 in | Wt 180.0 lb

## 2018-07-01 DIAGNOSIS — Z1231 Encounter for screening mammogram for malignant neoplasm of breast: Secondary | ICD-10-CM | POA: Diagnosis not present

## 2018-07-01 DIAGNOSIS — Z0000 Encounter for general adult medical examination without abnormal findings: Secondary | ICD-10-CM

## 2018-07-01 DIAGNOSIS — E669 Obesity, unspecified: Secondary | ICD-10-CM | POA: Diagnosis not present

## 2018-07-01 DIAGNOSIS — E2839 Other primary ovarian failure: Secondary | ICD-10-CM | POA: Diagnosis not present

## 2018-07-01 NOTE — Patient Instructions (Addendum)

## 2018-07-08 ENCOUNTER — Telehealth: Payer: Self-pay | Admitting: Family Medicine

## 2018-07-08 ENCOUNTER — Ambulatory Visit (HOSPITAL_BASED_OUTPATIENT_CLINIC_OR_DEPARTMENT_OTHER)
Admission: RE | Admit: 2018-07-08 | Discharge: 2018-07-08 | Disposition: A | Discharge status: Home / Self Care | Payer: Medicare HMO | Source: Ambulatory Visit | Attending: Family Medicine | Admitting: Family Medicine

## 2018-07-08 DIAGNOSIS — E2839 Other primary ovarian failure: Secondary | ICD-10-CM | POA: Diagnosis not present

## 2018-07-08 DIAGNOSIS — M8580 Other specified disorders of bone density and structure, unspecified site: Secondary | ICD-10-CM

## 2018-07-08 DIAGNOSIS — M85832 Other specified disorders of bone density and structure, left forearm: Secondary | ICD-10-CM | POA: Diagnosis not present

## 2018-07-08 DIAGNOSIS — M8589 Other specified disorders of bone density and structure, multiple sites: Secondary | ICD-10-CM | POA: Diagnosis not present

## 2018-07-08 DIAGNOSIS — Z780 Asymptomatic menopausal state: Secondary | ICD-10-CM | POA: Diagnosis not present

## 2018-07-08 NOTE — Telephone Encounter (Signed)
Called patient left message for patient to return call 

## 2018-07-08 NOTE — Telephone Encounter (Signed)
Please inform patient the following information: Her bone density has worsened from ~1.5 to 2.0. This is still in the bone osteopenia (bone softening) levels. However, with the progression we could try a medication called fosamax that is a once weekly medication to help prevent progression.  If she is agreeable I will call in for her. Must take with a full glass of water on an empty stomach and remain upright for 30 minutes after taking (no laying down after taking for 30 min)

## 2018-07-09 MED ORDER — ALENDRONATE SODIUM 35 MG PO TABS: 35.0000 mg | ORAL_TABLET | ORAL | 3 refills | Status: DC

## 2018-07-09 NOTE — Telephone Encounter (Signed)
Patient notified and verbalized understanding. Patient stated that she is willing to try the Fosamax.

## 2018-07-09 NOTE — Telephone Encounter (Signed)
Fosamax prescribed. Pick a day of the week (Sunday etc) and take once a week in the morning, with a full glass of water on emprty stomach.

## 2018-07-09 NOTE — Telephone Encounter (Signed)
Patient was notified and verbalized understanding.

## 2018-08-01 ENCOUNTER — Ambulatory Visit (INDEPENDENT_AMBULATORY_CARE_PROVIDER_SITE_OTHER): Payer: Medicare HMO | Admitting: Family Medicine

## 2018-08-01 ENCOUNTER — Encounter: Payer: Self-pay | Admitting: Family Medicine

## 2018-08-01 VITALS — BP 109/67 | HR 66 | Temp 98.1°F | Resp 20 | Ht 62.0 in | Wt 180.0 lb

## 2018-08-01 DIAGNOSIS — M8580 Other specified disorders of bone density and structure, unspecified site: Secondary | ICD-10-CM

## 2018-08-01 DIAGNOSIS — K219 Gastro-esophageal reflux disease without esophagitis: Secondary | ICD-10-CM

## 2018-08-01 DIAGNOSIS — I10 Essential (primary) hypertension: Secondary | ICD-10-CM | POA: Diagnosis not present

## 2018-08-01 DIAGNOSIS — E041 Nontoxic single thyroid nodule: Secondary | ICD-10-CM

## 2018-08-01 MED ORDER — PANTOPRAZOLE SODIUM 20 MG PO TBEC: 20.0000 mg | DELAYED_RELEASE_TABLET | Freq: Every day | ORAL | 1 refills | Status: DC | PRN

## 2018-08-01 MED ORDER — TRIAMTERENE-HCTZ 75-50 MG PO TABS: ORAL_TABLET | ORAL | 1 refills | Status: DC

## 2018-08-01 MED ORDER — VERAPAMIL HCL 120 MG PO TABS: 120.0000 mg | ORAL_TABLET | Freq: Every day | ORAL | 1 refills | Status: DC

## 2018-08-01 NOTE — Progress Notes (Signed)
Shelly Leonard , 10/07/1946, 72 y.o., female MRN: [id number] Patient Care Team    Relationship Specialty Notifications Start End  [name], DO PCP - General Family Medicine  08/18/15   Warden Fillers, MD Consulting Physician Ophthalmology  05/08/16   Milus Banister, MD Attending Physician Gastroenterology  05/08/16   Thornell Sartorius, MD Consulting Physician Otolaryngology  06/25/17   [facility]  Specialist  07/01/18     Chief Complaint  Patient presents with  . Follow-up    fosamax and thyroid      Subjective:  Shelly Leonard is a 72 y.o. female. Pt presents today for follow up on chronic medical conditions.  Essential hypertension, benign/morbid obesity Pt reports compliance with Maxzide 75-50 and verapamil 120 mg daily. Blood pressures ranges at home not routinely checked. Patient denies chest pain, shortness of breath, dizziness or lower extremity edema.  Pt takes a daily baby ASA. Pt is not prescribed statin. BMP: 03/24/2018 within normal limits with the exception of mildly low sodium and increased calcium CBC: 03/24/2018 within normal limits Lipid panel: 03/24/2018 total cholesterol 179, HDL 61, LDL 106, triglycerides 61. TSH: 05/09/2018 within normal limits Diet: Low sodium. Exercise: Routine exercise. RF: Hypertension, obesity, family history of heart disease  Gastroesophageal reflux disease, esophagitis presence not specified Doing well on Protonix 20 mg daily.  She is in need of refills today.  Osteopenia, unspecified location Last DEXA scan 2 weeks ago with worsening osteopenia at -2.0.  Patient has not started her Fosamax as of yet.  She had some discussion surrounding possible side effects which was reviewed with her today.  Hypercalcemia/Thyroid nodule Reviewed patient's TSH, last calcium, thyroid ultrasound and thyroid biopsy results with her in person today.  She does have multiple nodules within the thyroid.  Her biopsy was negative for  any type of malignancy.  SHe has started to increase her water consumption daily.   Depression screen [facility] 2/9 07/01/2018 03/14/2018 09/27/2017 06/25/2017 05/08/2016  Decreased Interest 0 0 0 0 0  Down, Depressed, Hopeless 0 0 3 0 0  PHQ - 2 Score 0 0 3 0 0  Altered sleeping - 0 2 - -  Tired, decreased energy - 0 2 - -  Change in appetite - 0 1 - -  Feeling bad or failure about yourself  - 0 0 - -  Trouble concentrating - 0 1 - -  Moving slowly or fidgety/restless - 0 0 - -  Suicidal thoughts - 0 0 - -  PHQ-9 Score - 0 9 - -  Difficult doing work/chores - - Somewhat difficult - -    No Known Allergies Social History   Tobacco Use  . Smoking status: Never Smoker  . Smokeless tobacco: Never Used  Substance Use Topics  . Alcohol use: No   Past Medical History:  Diagnosis Date  . Allergy   . Anemia    low iron (hgb 8)  . Arthritis    knees and hands   . Carpal tunnel syndrome   . Cataract   . Diverticulitis large intestine w/o perforation or abscess w/bleeding   . GERD (gastroesophageal reflux disease)   . Headache(784.0)    occasional   . Hypertension   . Osteopenia   . PONV (postoperative nausea and vomiting)   . Vertigo   . Vitamin D deficiency    Past Surgical History:  Procedure Laterality Date  . BREAST REDUCTION SURGERY  1982  . CARPAL TUNNEL RELEASE  left   . COLONOSCOPY    . FOOT SURGERY    . KNEE ARTHROSCOPY     right knee 2012   . OTHER SURGICAL HISTORY     spur removed top of left foot   . REDUCTION MAMMAPLASTY  1982  . TOTAL KNEE ARTHROPLASTY  04/21/2012   Procedure: TOTAL KNEE ARTHROPLASTY;  Surgeon: [name], MD;  Location: [facility];  Service: Orthopedics;  Laterality: Right;  [name] Kitchen VAGINAL HYSTERECTOMY  1977   Family History  Problem Relation Age of Onset  . Breast cancer Mother        late in life  . Diabetes Mother        late in life  . Heart disease Father   . Lung disease Father   . Colon cancer Neg Hx   . Esophageal cancer Neg Hx   .  Stomach cancer Neg Hx   . Rectal cancer Neg Hx    Allergies as of 08/01/2018   No Known Allergies     Medication List        Accurate as of 08/01/18  6:20 PM. Always use your most recent med list.          alendronate 35 MG tablet Commonly known as:  FOSAMAX Take 1 tablet (35 mg total) by mouth every 7 (seven) days. Take with a full glass of water on an empty stomach.   aspirin EC 81 MG tablet Take 81 mg by mouth daily.   azelastine 0.1 % nasal spray Commonly known as:  ASTELIN Place 2 sprays into both nostrils 2 (two) times daily. Use in each nostril as directed   ciclopirox 0.77 % cream Commonly known as:  LOPROX APPLY  CREAM TOPICALLY TO AFFECTED AREA TWICE DAILY   cyclobenzaprine 5 MG tablet Commonly known as:  FLEXERIL Take 1 tablet (5 mg total) by mouth 2 (two) times daily as needed for muscle spasms.   fluticasone 50 MCG/ACT nasal spray Commonly known as:  FLONASE Place 2 sprays into both nostrils daily.   Garlic 712 MG Tabs Take 100 mg by mouth daily with breakfast.   multivitamin with minerals Tabs tablet Take 1 tablet by mouth daily with breakfast.   OMEGA 3 PO Take 1 capsule by mouth daily.   pantoprazole 20 MG tablet Commonly known as:  PROTONIX Take 1 tablet (20 mg total) by mouth daily as needed.   PROBIOTIC DAILY PO Take by mouth.   triamterene-hydrochlorothiazide 75-50 MG tablet Commonly known as:  MAXZIDE TAKE 1 TABLET BY MOUTH IN THE MORNING WITH BREAKFAST   verapamil 120 MG tablet Commonly known as:  CALAN Take 1 tablet (120 mg total) by mouth daily.   VITAMIN D PO Take by mouth.       All past medical history, surgical history, allergies, family history, immunizations andmedications were updated in the EMR today and reviewed under the history and medication portions of their EMR.     ROS: Negative, with the exception of above mentioned in HPI   Objective:  BP 109/67 (BP Location: Left Arm, Patient Position: Sitting, Cuff Size:  Large)   Pulse 66   Temp 98.1 F (36.7 C)   Resp 20   Ht 5\' 2"  (1.575 m)   Wt 180 lb (81.6 kg)   SpO2 97%   BMI 32.92 kg/m  Body mass index is 32.92 kg/m. Gen: Afebrile. No acute distress.  Nontoxic and presentation.  Very pleasant obese African-American female HENT: AT. [place]. MMM.  Eyes:Pupils Equal  Round Reactive to light, Extraocular movements intact,  Conjunctiva without redness, discharge or icterus. Neck/lymp/endocrine: Supple, no lymphadenopathy, no thyromegaly CV: RRR no murmur, no edema, +2/4 P posterior tibialis pulses Chest: CTAB, no wheeze or crackles Skin: No rashes, purpura or petechiae.  Neuro:  Normal gait. PERLA. EOMi. Alert. Oriented x3  Psych: Normal affect, dress and demeanor. Normal speech. Normal thought content and judgment  No exam data present No results found. No results found for this or any previous visit (from the past 24 hour(s)).  Assessment/Plan: SHAKEELA RABADAN is a 72 y.o. female present for OV for  Essential hypertension, benign Stable.  Refills provided on Maxide and verapamil today. -Low-sodium diet, exercise encouraged. -Labs up-to-date. - triamterene-hydrochlorothiazide (MAXZIDE) 75-50 MG tablet; TAKE 1 TABLET BY MOUTH IN THE MORNING WITH BREAKFAST  Dispense: 90 tablet; Refill: 1 - verapamil (CALAN) 120 MG tablet; Take 1 tablet (120 mg total) by mouth daily.  Dispense: 90 tablet; Refill: 1 -Follow-up 6 months.  Gastroesophageal reflux disease, esophagitis presence not specified Stable.  Refills provided today on Protonix. - pantoprazole (PROTONIX) 20 MG tablet; Take 1 tablet (20 mg total) by mouth daily as needed.  Dispense: 90 tablet; Refill: 1  Osteopenia, unspecified location Discussed Fosamax use.  Encouraged her to start once a week, take on empty stomach with a full glass of water and be upright for at least 30 minutes.  She finds she is having more GERD-like symptoms with use of Fosamax she can stop. -Viewed DEXA scan results with  her today.  Hypercalcemia/Thyroid nodule -Reviewed labs, ultrasound and biopsy results with her today. -Biopsy was benign.  Will repeat thyroid ultrasound May 2020 for surveillance. - We will recheck CMP labs on next visit.  PTH was normal.  Vitamin D 30. -Continue to work on adequate hydration. -Follow up 6 months    Reviewed expectations re: course of current medical issues.  Discussed self-management of symptoms.  Outlined signs and symptoms indicating need for more acute intervention.  Patient verbalized understanding and all questions were answered.  Patient received an After-Visit Summary.    No orders of the defined types were placed in this encounter.    Note is dictated utilizing voice recognition software. Although note has been proof read prior to signing, occasional typographical errors still can be missed. If any questions arise, please do not hesitate to call for verification.   electronically signed by:  Howard Pouch, DO  [place]

## 2018-08-01 NOTE — Patient Instructions (Signed)
Refills on meds today. Follow up in 6 months on chronic conditions.  Try the fosamax as we discussed today, if it causes stomach upset stop it.   Thyroid Ultrasound will be repeated 04/2019. The thyroid biopsy was normal.   Your BP looks great.

## 2018-08-14 ENCOUNTER — Other Ambulatory Visit: Payer: Self-pay | Admitting: Family Medicine

## 2018-08-14 DIAGNOSIS — B356 Tinea cruris: Secondary | ICD-10-CM

## 2018-10-14 ENCOUNTER — Other Ambulatory Visit: Payer: Self-pay | Admitting: Family Medicine

## 2018-10-14 DIAGNOSIS — I10 Essential (primary) hypertension: Secondary | ICD-10-CM

## 2018-12-23 ENCOUNTER — Encounter: Payer: Self-pay | Admitting: Family Medicine

## 2018-12-23 ENCOUNTER — Ambulatory Visit (INDEPENDENT_AMBULATORY_CARE_PROVIDER_SITE_OTHER): Payer: Medicare HMO | Admitting: Family Medicine

## 2018-12-23 VITALS — BP 114/69 | HR 71 | Temp 98.7°F | Resp 16 | Ht 62.0 in | Wt 175.0 lb

## 2018-12-23 DIAGNOSIS — J0101 Acute recurrent maxillary sinusitis: Secondary | ICD-10-CM

## 2018-12-23 MED ORDER — DOXYCYCLINE HYCLATE 100 MG PO TABS: 100.0000 mg | ORAL_TABLET | Freq: Two times a day (BID) | ORAL | 0 refills | Status: DC

## 2018-12-23 NOTE — Patient Instructions (Signed)
Rest, hydrate.  + flonase, coricidin, nasal saline.  Continue Claritin daily.  Doxycyline 100 mg BID prescribed, take until completed.  If cough present it can last up to 6-8 weeks.  F/U 2 weeks of not improved.    Sinusitis, Adult Sinusitis is soreness and swelling (inflammation) of your sinuses. Sinuses are hollow spaces in the bones around your face. They are located:  Around your eyes.  In the middle of your forehead.  Behind your nose.  In your cheekbones. Your sinuses and nasal passages are lined with a fluid called mucus. Mucus drains out of your sinuses. Swelling can trap mucus in your sinuses. This lets germs (bacteria, virus, or fungus) grow, which leads to infection. Most of the time, this condition is caused by a virus. What are the causes? This condition is caused by:  Allergies.  Asthma.  Germs.  Things that block your nose or sinuses.  Growths in the nose (nasal polyps).  Chemicals or irritants in the air.  Fungus (rare). What increases the risk? You are more likely to develop this condition if:  You have a weak body defense system (immune system).  You do a lot of swimming or diving.  You use nasal sprays too much.  You smoke. What are the signs or symptoms? The main symptoms of this condition are pain and a feeling of pressure around the sinuses. Other symptoms include:  Stuffy nose (congestion).  Runny nose (drainage).  Swelling and warmth in the sinuses.  Headache.  Toothache.  A cough that may get worse at night.  Mucus that collects in the throat or the back of the nose (postnasal drip).  Being unable to smell and taste.  Being very tired (fatigue).  A fever.  Sore throat.  Bad breath. How is this diagnosed? This condition is diagnosed based on:  Your symptoms.  Your medical history.  A physical exam.  Tests to find out if your condition is short-term (acute) or long-term (chronic). Your doctor may: ? Check your  nose for growths (polyps). ? Check your sinuses using a tool that has a light (endoscope). ? Check for allergies or germs. ? Do imaging tests, such as an MRI or CT scan. How is this treated? Treatment for this condition depends on the cause and whether it is short-term or long-term.  If caused by a virus, your symptoms should go away on their own within 10 days. You may be given medicines to relieve symptoms. They include: ? Medicines that shrink swollen tissue in the nose. ? Medicines that treat allergies (antihistamines). ? A spray that treats swelling of the nostrils. ? Rinses that help get rid of thick mucus in your nose (nasal saline washes).  If caused by bacteria, your doctor may wait to see if you will get better without treatment. You may be given antibiotic medicine if you have: ? A very bad infection. ? A weak body defense system.  If caused by growths in the nose, you may need to have surgery. Follow these instructions at home: Medicines  Take, use, or apply over-the-counter and prescription medicines only as told by your doctor. These may include nasal sprays.  If you were prescribed an antibiotic medicine, take it as told by your doctor. Do not stop taking the antibiotic even if you start to feel better. Hydrate and humidify   Drink enough water to keep your pee (urine) pale yellow.  Use a cool mist humidifier to keep the humidity level in your home above  50%.  Breathe in steam for 10-15 minutes, 3-4 times a day, or as told by your doctor. You can do this in the bathroom while a hot shower is running.  Try not to spend time in cool or dry air. Rest  Rest as much as you can.  Sleep with your head raised (elevated).  Make sure you get enough sleep each night. General instructions   Put a warm, moist washcloth on your face 3-4 times a day, or as often as told by your doctor. This will help with discomfort.  Wash your hands often with soap and water. If there  is no soap and water, use hand sanitizer.  Do not smoke. Avoid being around people who are smoking (secondhand smoke).  Keep all follow-up visits as told by your doctor. This is important. Contact a doctor if:  You have a fever.  Your symptoms get worse.  Your symptoms do not get better within 10 days. Get help right away if:  You have a very bad headache.  You cannot stop throwing up (vomiting).  You have very bad pain or swelling around your face or eyes.  You have trouble seeing.  You feel confused.  Your neck is stiff.  You have trouble breathing. Summary  Sinusitis is swelling of your sinuses. Sinuses are hollow spaces in the bones around your face.  This condition is caused by tissues in your nose that become inflamed or swollen. This traps germs. These can lead to infection.  If you were prescribed an antibiotic medicine, take it as told by your doctor. Do not stop taking it even if you start to feel better.  Keep all follow-up visits as told by your doctor. This is important. This information is not intended to replace advice given to you by your health care provider. Make sure you discuss any questions you have with your health care provider. Document Released: 05/28/2008 Document Revised: 05/12/2018 Document Reviewed: 05/12/2018 Elsevier Interactive Patient Education  2019 [employer].

## 2018-12-23 NOTE — Progress Notes (Signed)
Shelly Leonard , 1946-01-23, 72 y.o., female MRN: [id number] Patient Care Team    Relationship Specialty Notifications Start End  [name], DO PCP - General Family Medicine  08/18/15   Warden Fillers, MD Consulting Physician Ophthalmology  05/08/16   Milus Banister, MD Attending Physician Gastroenterology  05/08/16   Thornell Sartorius, MD Consulting Physician Otolaryngology  06/25/17   Manson Passey, [facility]  Specialist  07/01/18     Chief Complaint  Patient presents with  . Nasal Congestion    x 1wks  . Cough     Subjective: Pt presents for an OV with complaints of productive cough of over 1 week duration.  Associated symptoms include headache, nasal congestion, runny nose. She is eating and drinking ok, decreased appetite.  She denies ear pain, sore throat, fever, chills, nausea or vomit.  Not taking Singulair. She is taking Claritin and flonase.   Depression screen [facility] 2/9 07/01/2018 03/14/2018 09/27/2017 06/25/2017 05/08/2016  Decreased Interest 0 0 0 0 0  Down, Depressed, Hopeless 0 0 3 0 0  PHQ - 2 Score 0 0 3 0 0  Altered sleeping - 0 2 - -  Tired, decreased energy - 0 2 - -  Change in appetite - 0 1 - -  Feeling bad or failure about yourself  - 0 0 - -  Trouble concentrating - 0 1 - -  Moving slowly or fidgety/restless - 0 0 - -  Suicidal thoughts - 0 0 - -  PHQ-9 Score - 0 9 - -  Difficult doing work/chores - - Somewhat difficult - -    No Known Allergies Social History   Tobacco Use  . Smoking status: Never Smoker  . Smokeless tobacco: Never Used  Substance Use Topics  . Alcohol use: No   Past Medical History:  Diagnosis Date  . Allergy   . Anemia    low iron (hgb 8)  . Arthritis    knees and hands   . Carpal tunnel syndrome   . Cataract   . Diverticulitis large intestine w/o perforation or abscess w/bleeding   . GERD (gastroesophageal reflux disease)   . Headache(784.0)    occasional   . Hypertension   . Osteopenia   . PONV (postoperative nausea  and vomiting)   . Vertigo   . Vitamin D deficiency    Past Surgical History:  Procedure Laterality Date  . BREAST REDUCTION SURGERY  1982  . CARPAL TUNNEL RELEASE     left   . COLONOSCOPY    . FOOT SURGERY    . KNEE ARTHROSCOPY     right knee 2012   . OTHER SURGICAL HISTORY     spur removed top of left foot   . REDUCTION MAMMAPLASTY  1982  . TOTAL KNEE ARTHROPLASTY  04/21/2012   Procedure: TOTAL KNEE ARTHROPLASTY;  Surgeon: [name], MD;  Location: [facility];  Service: Orthopedics;  Laterality: Right;  [name] Kitchen VAGINAL HYSTERECTOMY  1977   Family History  Problem Relation Age of Onset  . Breast cancer Mother        late in life  . Diabetes Mother        late in life  . Heart disease Father   . Lung disease Father   . Colon cancer Neg Hx   . Esophageal cancer Neg Hx   . Stomach cancer Neg Hx   . Rectal cancer Neg Hx    Allergies as of 12/23/2018   No  Known Allergies     Medication List       Accurate as of December 23, 2018 11:24 AM. Always use your most recent med list.        alendronate 35 MG tablet Commonly known as:  FOSAMAX Take 1 tablet (35 mg total) by mouth every 7 (seven) days. Take with a full glass of water on an empty stomach.   aspirin EC 81 MG tablet Take 81 mg by mouth daily.   azelastine 0.1 % nasal spray Commonly known as:  ASTELIN Place 2 sprays into both nostrils 2 (two) times daily. Use in each nostril as directed   ciclopirox 0.77 % cream Commonly known as:  LOPROX APPLY  CREAM TOPICALLY TO AFFECTED AREA TWICE DAILY   cyclobenzaprine 5 MG tablet Commonly known as:  FLEXERIL Take 1 tablet (5 mg total) by mouth 2 (two) times daily as needed for muscle spasms.   fluticasone 50 MCG/ACT nasal spray Commonly known as:  FLONASE Place 2 sprays into both nostrils daily.   Garlic 191 MG Tabs Take 100 mg by mouth daily with breakfast.   multivitamin with minerals Tabs tablet Take 1 tablet by mouth daily with breakfast.   OMEGA 3 PO Take 1  capsule by mouth daily.   pantoprazole 20 MG tablet Commonly known as:  PROTONIX Take 1 tablet (20 mg total) by mouth daily as needed.   PROBIOTIC DAILY PO Take by mouth.   triamterene-hydrochlorothiazide 75-50 MG tablet Commonly known as:  MAXZIDE TAKE 1 TABLET BY MOUTH IN THE MORNING WITH BREAKFAST   verapamil 120 MG tablet Commonly known as:  CALAN Take 1 tablet (120 mg total) by mouth daily.   VITAMIN D PO Take by mouth.       All past medical history, surgical history, allergies, family history, immunizations andmedications were updated in the EMR today and reviewed under the history and medication portions of their EMR.     ROS: Negative, with the exception of above mentioned in HPI   Objective:  BP 114/69 (BP Location: Right Arm, Patient Position: Sitting, Cuff Size: Large)   Pulse 71   Temp 98.7 F (37.1 C) (Oral)   Resp 16   Ht 5\' 2"  (1.575 m)   Wt 175 lb (79.4 kg)   SpO2 96%   BMI 32.01 kg/m  Body mass index is 32.01 kg/m. Gen: Afebrile. No acute distress. Nontoxic in appearance, well developed, well nourished.  HENT: AT. [place]. Bilateral TM visualized WNL. MMM, no oral lesions. Bilateral nares with severe drainage and swelling. Throat without erythema or exudates. Mild cough present. Mild hoarseness present, TTP max sinus.  Eyes:Pupils Equal Round Reactive to light, Extraocular movements intact,  Conjunctiva without redness, discharge or icterus. Neck/lymp/endocrine: Supple,no lymphadenopathy CV: RRR Chest: CTAB, no wheeze or crackles.  Skin: no rashes, purpura or petechiae.  Neuro: Normal gait. PERLA. EOMi. Alert. Oriented x3   No exam data present No results found. No results found for this or any previous visit (from the past 24 hour(s)).  Assessment/Plan: Shelly Leonard is a 72 y.o. female present for OV for  Chronic sinusitis, unspecified location She has been doing very well; last sinus infection last year this time. Rest, hydrate.  +  flonase, coricidin, nasal saline.  Continue Claritin daily.  Doxycyline 100 mg BID prescribed, take until completed.  If cough present it can last up to 6-8 weeks.  F/U 2 weeks of not improved.     Reviewed expectations re: course of current  medical issues.  Discussed self-management of symptoms.  Outlined signs and symptoms indicating need for more acute intervention.  Patient verbalized understanding and all questions were answered.  Patient received an After-Visit Summary.    No orders of the defined types were placed in this encounter.    Note is dictated utilizing voice recognition software. Although note has been proof read prior to signing, occasional typographical errors still can be missed. If any questions arise, please do not hesitate to call for verification.   electronically signed by:  Howard Pouch, DO  [facility]

## 2018-12-25 ENCOUNTER — Ambulatory Visit: Payer: Self-pay

## 2018-12-25 ENCOUNTER — Ambulatory Visit: Payer: Self-pay | Admitting: *Deleted

## 2018-12-25 DIAGNOSIS — J0101 Acute recurrent maxillary sinusitis: Secondary | ICD-10-CM

## 2018-12-25 MED ORDER — AMOXICILLIN 500 MG PO CAPS: 500.0000 mg | ORAL_CAPSULE | Freq: Three times a day (TID) | ORAL | 0 refills | Status: DC

## 2018-12-25 NOTE — Telephone Encounter (Signed)
Spoke w pt. Advise that Dr. Raoul Pitch has called in amox TID to her pharmacy. Advised pt to call pharmacy and see what the cost of this medication is before she heads out to pick it up. Advised pt to call back if she is still having issues getting her medication. Pt states that she is doing better from early this morning.

## 2018-12-25 NOTE — Telephone Encounter (Signed)
Summary: Pharmacy does not have rx that was phoned in   Patient calling and states that she just got off the phone with the pharmacy and was told that they did not have the amoxicillin (AMOXIL) 500 MG capsule that was sent in for her. Patient would like to know if this could be called in again?     Call to pharmacy- Rx not at pharmacy- verbal as written to pharmacy.  Reason for Disposition . Caller has medication question only, adult not sick, and triager answers question  Protocols used: MEDICATION QUESTION CALL-A-AH

## 2018-12-25 NOTE — Telephone Encounter (Signed)
Sent medication to pharmacy. Pt aware. °

## 2018-12-25 NOTE — Telephone Encounter (Signed)
Tried to contact pt, got a recording that this person isn't accepting calls at this time. I will try to reach pt again later this afternoon.

## 2018-12-25 NOTE — Telephone Encounter (Signed)
Pt c/o coughing up blood that started this morning. Pt stated that she cannot determiine how much blood she is coughing up. Pt stated the blood appears after a coughing spell. Pt c/o chest and back soreness for the coughing. Pt denies SOB or wheezing. Pt denies fever. Pt denies vomiting or abdominal pain.  Pt had OV 12/23/18 and was prescribed doxycycline. Pt stated that she has not picked up the antibiotic due to the cost. Pt stated that she cannot afford the $47.00 copayment.  Care advice given to pt and pt verbalized understanding except for refusing to come in for an appointment. Called PCP office and spoke with Hinton Dyer who stated to send note to the office for review.   Reason for Disposition . [1] More than one episode of coughing up blood AND [2] < 1 tablespoon (15 ml) of pure red blood    Pt cannot quantify the amount of blood.  Answer Assessment - Initial Assessment Questions 1. ONSET: "When did you start coughing up blood?"     This morning 2. SEVERITY: "How many times?" "How much blood?" (e.g., flecks, streaks, tablespoons, etc)     4 times- streaks seeing more blood now than when first started 3. COUGHING SPASMS: "Did the blood appear after a coughing spell?"       yes 4. RESPIRATORY DISTRESS: "Describe your breathing."      No SOB, chest and back is sore 5. FEVER: "Do you have a fever?" If so, ask: "What is your temperature, how was it measured, and when did it start?"     no 6. SPUTUM: "Describe the color of your sputum" (clear, white, yellow, green), "Has there been any change recently?"     white 7. CARDIAC HISTORY: "Do you have any history of heart disease?" (e.g., heart attack, congestive heart failure)      no 8. LUNG HISTORY: "Do you have any history of lung disease?"  (e.g., pulmonary embolus, asthma, emphysema)     no 9. PE RISK FACTORS: "Do you have a history of blood clots?" (or: recent major surgery, recent prolonged travel, bedridden)     no 10. OTHER SYMPTOMS: "Do  you have any other symptoms?" (e.g., nosebleed, chest pain, abdominal pain, vomiting)       no 11. PREGNANCY: "Is there any chance you are pregnant?" "When was your last menstrual period?"       n/a 12. TRAVEL: "Have you traveled out of the country in the last month?" (e.g., travel history, exposures)       no  Protocols used: COUGHING UP BLOOD-A-AH

## 2018-12-25 NOTE — Addendum Note (Signed)
Addended by: [name] on: 12/25/2018 01:21 PM   Modules accepted: Orders

## 2018-12-25 NOTE — Telephone Encounter (Signed)
For best medical practice- pt should be seen in an Urgent care if truly coughing up "blood."Bronchitis can cause blood tinged sputum- but not bright red blood.   If the abx is too expensive- there are other abx. I have called amox TID- I have no way of knowing from our end what the cost will be for her. If it too expensive she should ask the pharmacist if there is an abx on her formulary that is less expensive.  - If not bright red blood, then I would suggest starting the new abx prescribed first. I would be happy to look at her in the office- however she is still going to need a cxr and abx.

## 2019-01-27 ENCOUNTER — Ambulatory Visit: Payer: Self-pay | Admitting: Family Medicine

## 2019-02-03 ENCOUNTER — Ambulatory Visit (INDEPENDENT_AMBULATORY_CARE_PROVIDER_SITE_OTHER): Payer: Medicare HMO | Admitting: Family Medicine

## 2019-02-03 ENCOUNTER — Encounter: Payer: Self-pay | Admitting: Family Medicine

## 2019-02-03 VITALS — BP 95/58 | HR 61 | Temp 97.3°F | Resp 16 | Ht 62.0 in | Wt 176.1 lb

## 2019-02-03 DIAGNOSIS — K219 Gastro-esophageal reflux disease without esophagitis: Secondary | ICD-10-CM

## 2019-02-03 DIAGNOSIS — I10 Essential (primary) hypertension: Secondary | ICD-10-CM

## 2019-02-03 DIAGNOSIS — E041 Nontoxic single thyroid nodule: Secondary | ICD-10-CM

## 2019-02-03 LAB — COMPREHENSIVE METABOLIC PANEL
ALBUMIN: 4.1 g/dL (ref 3.5–5.2)
ALK PHOS: 42 U/L (ref 39–117)
ALT: 17 U/L (ref 0–35)
AST: 18 U/L (ref 0–37)
BUN: 16 mg/dL (ref 6–23)
CALCIUM: 10.6 mg/dL — AB (ref 8.4–10.5)
CHLORIDE: 99 meq/L (ref 96–112)
CO2: 29 mEq/L (ref 19–32)
Creatinine, Ser: 0.61 mg/dL (ref 0.40–1.20)
GFR: 116.89 mL/min (ref >60.00)
Glucose, Bld: 86 mg/dL (ref 70–99)
POTASSIUM: 3.7 meq/L (ref 3.5–5.1)
SODIUM: 136 meq/L (ref 135–145)
Total Bilirubin: 0.8 mg/dL (ref 0.2–1.2)
Total Protein: 7.3 g/dL (ref 6.0–8.3)

## 2019-02-03 MED ORDER — PANTOPRAZOLE SODIUM 20 MG PO TBEC: 20.0000 mg | DELAYED_RELEASE_TABLET | Freq: Every day | ORAL | 1 refills | Status: DC | PRN

## 2019-02-03 MED ORDER — VERAPAMIL HCL 120 MG PO TABS: 120.0000 mg | ORAL_TABLET | Freq: Every day | ORAL | 1 refills | Status: DC

## 2019-02-03 MED ORDER — TRIAMTERENE-HCTZ 75-50 MG PO TABS: ORAL_TABLET | ORAL | 1 refills | Status: DC

## 2019-02-03 NOTE — Patient Instructions (Addendum)
Make sure to schedule you [place] (when they call) to the first week in June.   I have refilled your medication for you today. Monitor your blood pressure, if it stays below 100 on the top and you have any dizziness or fatigue--> follow up sooner- would need to consider lowering medications.   Follow up in 6 months.    Please help [place] help you:  We are honored you have chosen [facility] for your Primary Care home. Below you will find basic instructions that you may need to access in the future. Please help [place] help you by reading the instructions, which cover many of the frequent questions we experience.   Prescription refills and request:  -In order to allow more efficient response time, please call your pharmacy for all refills. They will forward the request electronically to [place]. This allows for the quickest possible response. Request left on a nurse line can take longer to refill, since these are checked as time allows between office patients and other phone calls.  - refill request can take up to 3-5 working days to complete.  - If request is sent electronically and request is appropiate, it is usually completed in 1-2 business days.  - all patients will need to be seen routinely for all chronic medical conditions requiring prescription medications (see follow-up below). If you are overdue for follow up on your condition, you will be asked to make an appointment and we will call in enough medication to cover you until your appointment (up to 30 days).  - all controlled substances will require a face to face visit to request/refill.  - if you desire your prescriptions to go through a new pharmacy, and have an active script at original pharmacy, you will need to call your pharmacy and have scripts transferred to new pharmacy. This is completed between the pharmacy locations and not by your provider.    Results: If any images or labs were ordered, it can take up to 1 week to get results  depending on the test ordered and the lab/facility running and resulting the test. - Normal or stable results, which do not need further discussion, may be released to your mychart immediately with attached note to you. A call may not be generated for normal results. Please make certain to sign up for mychart. If you have questions on how to activate your mychart you can call the front office.  - If your results need further discussion, our office will attempt to contact you via phone, and if unable to reach you after 2 attempts, we will release your abnormal result to your mychart with instructions.  - All results will be automatically released in mychart after 1 week.  - Your provider will provide you with explanation and instruction on all relevant material in your results. Please keep in mind, results and labs may appear confusing or abnormal to the untrained eye, but it does not mean they are actually abnormal for you personally. If you have any questions about your results that are not covered, or you desire more detailed explanation than what was provided, you should make an appointment with your provider to do so.   Our office handles many outgoing and incoming calls daily. If we have not contacted you within 1 week about your results, please check your mychart to see if there is a message first and if not, then contact our office.  In helping with this matter, you help decrease call volume,  and therefore allow [place] to be able to respond to patients needs more efficiently.   Acute office visits (sick visit):  An acute visit is intended for a new problem and are scheduled in shorter time slots to allow schedule openings for patients with new problems. This is the appropriate visit to discuss a new problem. Problems will not be addressed by phone call or Echart message. Appointment is needed if requesting treatment. In order to provide you with excellent quality medical care with proper time for you to  explain your problem, have an exam and receive treatment with instructions, these appointments should be limited to one new problem per visit. If you experience a new problem, in which you desire to be addressed, please make an acute office visit, we save openings on the schedule to accommodate you. Please do not save your new problem for any other type of visit, let us take care of it properly and quickly for you.   Follow up visits:  Depending on your condition(s) your provider will need to see you routinely in order to provide you with quality care and prescribe medication(s). Most chronic conditions (Example: hypertension, Diabetes, depression/anxiety... etc), require visits a couple times a year. Your provider will instruct you on proper follow up for your personal medical conditions and history. Please make certain to make follow up appointments for your condition as instructed. Failing to do so could result in lapse in your medication treatment/refills. If you request a refill, and are overdue to be seen on a condition, we will always provide you with a 30 day script (once) to allow you time to schedule.    Medicare wellness (well visit): - we have a wonderful Nurse Maudie Mercury), that will meet with you and provide you will yearly medicare wellness visits. These visits should occur yearly (can not be scheduled less than 1 calendar year apart) and cover preventive health, immunizations, advance directives and screenings you are entitled to yearly through your medicare benefits. Do not miss out on your entitled benefits, this is when medicare will pay for these benefits to be ordered for you.  These are strongly encouraged by your provider and is the appropriate type of visit to make certain you are up to date with all preventive health benefits. If you have not had your medicare wellness exam in the last 12 months, please make certain to schedule one by calling the office and schedule your medicare wellness  with Maudie Mercury as soon as possible.   Yearly physical (well visit):  - Adults are recommended to be seen yearly for physicals. Check with your insurance and date of your last physical, most insurances require one calendar year between physicals. Physicals include all preventive health topics, screenings, medical exam and labs that are appropriate for gender/age and history. You may have fasting labs needed at this visit. This is a well visit (not a sick visit), new problems should not be covered during this visit (see acute visit).  - Pediatric patients are seen more frequently when they are younger. Your provider will advise you on well child visit timing that is appropriate for your their age. - This is not a medicare wellness visit. Medicare wellness exams do not have an exam portion to the visit. Some medicare companies allow for a physical, some do not allow a yearly physical. If your medicare allows a yearly physical you can schedule the medicare wellness with our nurse Maudie Mercury and have your physical with your provider after, on the  same day. Please check with insurance for your full benefits.   Late Policy/No Shows:  - all new patients should arrive 15-30 minutes earlier than appointment to allow [place] time  to  obtain all personal demographics,  insurance information and for you to complete office paperwork. - All established patients should arrive 10-15 minutes earlier than appointment time to update all information and be checked in .  - In our best efforts to run on time, if you are late for your appointment you will be asked to either reschedule or if able, we will work you back into the schedule. There will be a wait time to work you back in the schedule,  depending on availability.  - If you are unable to make it to your appointment as scheduled, please call 24 hours ahead of time to allow [place] to fill the time slot with someone else who needs to be seen. If you do not cancel your appointment ahead of  time, you may be charged a no show fee.

## 2019-02-03 NOTE — Progress Notes (Signed)
Shelly Leonard , 22-Jun-1946, 73 y.o., female MRN: [id number] Patient Care Team    Relationship Specialty Notifications Start End  [name], DO PCP - General Family Medicine  08/18/15   Warden Fillers, MD Consulting Physician Ophthalmology  05/08/16   Milus Banister, MD Attending Physician Gastroenterology  05/08/16   Thornell Sartorius, MD Consulting Physician Otolaryngology  06/25/17   Manson Passey, Emerge  Specialist  07/01/18     Chief Complaint  Patient presents with  . Hypertension    [place] visit. Pt just got 90 reflls on meds but has zero refills. Pt is not fasting. No complaints. Pt took aBP meds this AM.      Subjective:  Shelly Leonard is a 73 y.o. female. Pt presents today for follow up on chronic medical conditions.   Essential hypertension, benign/morbid obesity Pt reports compliance with Maxzide 75-50 and verapamil 120 mg daily. Blood pressures ranges at home not routinely checked. Patient denies chest pain, shortness of breath, dizziness or lower extremity edema.  Pt takes a daily baby ASA. Pt is not prescribed statin. BMP: 03/24/2018 within normal limits with the exception of mildly low sodium and increased calcium CBC: 03/24/2018 within normal limits Lipid panel: 03/24/2018 total cholesterol 179, HDL 61, LDL 106, triglycerides 61. TSH: 05/09/2018 within normal limits Diet: Low sodium. Exercise: Routine exercise. RF: Hypertension, obesity, family history of heart disease  Gastroesophageal reflux disease, esophagitis presence not specified Doing well on Protonix 20 mg daily.  She is in need of refills today.  Hypercalcemia/Thyroid nodule She does have multiple nodules within the thyroid.  Her biopsy was negative for any type of malignancy.  SHe has started to increase her water consumption daily.  Repeat ultrasound due June 2019   Depression screen [facility] 2/9 02/05/2019 07/01/2018 03/14/2018 09/27/2017 06/25/2017  Decreased Interest 0 0 0 0 0  Down, Depressed, Hopeless 0 0 0  3 0  PHQ - 2 Score 0 0 0 3 0  Altered sleeping - - 0 2 -  Tired, decreased energy - - 0 2 -  Change in appetite - - 0 1 -  Feeling bad or failure about yourself  - - 0 0 -  Trouble concentrating - - 0 1 -  Moving slowly or fidgety/restless - - 0 0 -  Suicidal thoughts - - 0 0 -  PHQ-9 Score - - 0 9 -  Difficult doing work/chores - - - Somewhat difficult -    No Known Allergies Social History   Tobacco Use  . Smoking status: Never Smoker  . Smokeless tobacco: Never Used  Substance Use Topics  . Alcohol use: No   Past Medical History:  Diagnosis Date  . Allergy   . Anemia    low iron (hgb 8)  . Arthritis    knees and hands   . Carpal tunnel syndrome   . Cataract   . Diverticulitis large intestine w/o perforation or abscess w/bleeding   . GERD (gastroesophageal reflux disease)   . Headache(784.0)    occasional   . Hypertension   . Osteopenia   . PONV (postoperative nausea and vomiting)   . Vertigo   . Vitamin D deficiency    Past Surgical History:  Procedure Laterality Date  . BREAST REDUCTION SURGERY  1982  . CARPAL TUNNEL RELEASE     left   . COLONOSCOPY    . FOOT SURGERY    . KNEE ARTHROSCOPY     right knee 2012   .  OTHER SURGICAL HISTORY     spur removed top of left foot   . REDUCTION MAMMAPLASTY  1982  . TOTAL KNEE ARTHROPLASTY  04/21/2012   Procedure: TOTAL KNEE ARTHROPLASTY;  Surgeon: [name], MD;  Location: [facility];  Service: Orthopedics;  Laterality: Right;  [name] Kitchen VAGINAL HYSTERECTOMY  1977   Family History  Problem Relation Age of Onset  . Breast cancer Mother        late in life  . Diabetes Mother        late in life  . Heart disease Father   . Lung disease Father   . Colon cancer Neg Hx   . Esophageal cancer Neg Hx   . Stomach cancer Neg Hx   . Rectal cancer Neg Hx    Allergies as of 02/03/2019   No Known Allergies     Medication List       Accurate as of February 03, 2019 11:59 PM. Always use your most recent med list.          alendronate 35 MG tablet Commonly known as:  FOSAMAX Take 1 tablet (35 mg total) by mouth every 7 (seven) days. Take with a full glass of water on an empty stomach.   aspirin EC 81 MG tablet Take 81 mg by mouth daily.   azelastine 0.1 % nasal spray Commonly known as:  ASTELIN Place 2 sprays into both nostrils 2 (two) times daily. Use in each nostril as directed   ciclopirox 0.77 % cream Commonly known as:  LOPROX APPLY  CREAM TOPICALLY TO AFFECTED AREA TWICE DAILY   fluticasone 50 MCG/ACT nasal spray Commonly known as:  FLONASE Place 2 sprays into both nostrils daily.   Garlic 202 MG Tabs Take 100 mg by mouth daily with breakfast.   loratadine 10 MG tablet Commonly known as:  CLARITIN Take 10 mg by mouth daily.   multivitamin with minerals Tabs tablet Take 1 tablet by mouth daily with breakfast.   OMEGA 3 PO Take 1 capsule by mouth daily.   pantoprazole 20 MG tablet Commonly known as:  PROTONIX Take 1 tablet (20 mg total) by mouth daily as needed.   PROBIOTIC DAILY PO Take by mouth.   triamterene-hydrochlorothiazide 75-50 MG tablet Commonly known as:  MAXZIDE TAKE 1 TABLET BY MOUTH IN THE MORNING WITH BREAKFAST   verapamil 120 MG tablet Commonly known as:  CALAN Take 1 tablet (120 mg total) by mouth daily.   VITAMIN D PO Take by mouth.       All past medical history, surgical history, allergies, family history, immunizations andmedications were updated in the EMR today and reviewed under the history and medication portions of their EMR.     ROS: Negative, with the exception of above mentioned in HPI   Objective:  BP (!) 95/58 (BP Location: Left Arm, Patient Position: Sitting, Cuff Size: Normal)   Pulse 61   Temp (!) 97.3 F (36.3 C) (Oral)   Resp 16   Ht 5' 2"  (1.575 m)   Wt 176 lb 2 oz (79.9 kg)   SpO2 98%   BMI 32.21 kg/m  Body mass index is 32.21 kg/m. Gen: Afebrile. No acute distress.  Nontoxic in presentation, obese, African-American  female, very pleasant. HENT: AT. [place].  MMM.  Eyes:Pupils Equal Round Reactive to light, Extraocular movements intact,  Conjunctiva without redness, discharge or icterus. Neck/lymp/endocrine: Supple, no lymphadenopathy, no thyromegaly CV: RRR no murmur, no edema, +2/4 P posterior tibialis pulses Chest: CTAB, no  wheeze or crackles.  Mildly diminished air movement. Abd: Soft. NTND. BS present.  No masses palpated.  Skin: no rashes, purpura or petechiae.  Neuro:  Normal gait. PERLA. EOMi. Alert. Oriented. Psych: Normal affect, dress and demeanor. Normal speech. Normal thought content and judgment.   No exam data present No results found. No results found for this or any previous visit (from the past 24 hour(s)).  Assessment/Plan: Shelly Leonard is a 73 y.o. female present for OV for  Essential hypertension, benign - Stable.  Refills provided on Maxide and verapamil today. Monitor for lower end BP or dizziness.  Follow-up sooner if she appreciates any of the above. -Low-sodium diet, exercise encouraged. - triamterene-hydrochlorothiazide (MAXZIDE) 75-50 MG tablet; TAKE 1 TABLET BY MOUTH IN THE MORNING WITH BREAKFAST  Dispense: 90 tablet; Refill: 1 - verapamil (CALAN) 120 MG tablet; Take 1 tablet (120 mg total) by mouth daily.  Dispense: 90 tablet; Refill: 1 -Follow-up 6 months.  Gastroesophageal reflux disease, esophagitis presence not specified Stable.  Refills provided today on Protonix. - pantoprazole (PROTONIX) 20 MG tablet; Take 1 tablet (20 mg total) by mouth daily as needed.  Dispense: 90 tablet; Refill: 1  Hypercalcemia/Thyroid nodule - CMP ordered and US thyroid rpt for June 2020 -Biopsy was benign.   -PTH was normal.  Vitamin D 30. -Continue to work on adequate hydration. -Follow up 6 months    Reviewed expectations re: course of current medical issues.  Discussed self-management of symptoms.  Outlined signs and symptoms indicating need for more acute  intervention.  Patient verbalized understanding and all questions were answered.  Patient received an After-Visit Summary.    Orders Placed This Encounter  Procedures  . Comp Met (CMET)     Note is dictated utilizing voice recognition software. Although note has been proof read prior to signing, occasional typographical errors still can be missed. If any questions arise, please do not hesitate to call for verification.   electronically signed by:  Howard Pouch, DO  [place]

## 2019-02-05 ENCOUNTER — Encounter: Payer: Self-pay | Admitting: Family Medicine

## 2019-03-04 ENCOUNTER — Other Ambulatory Visit: Payer: Self-pay

## 2019-03-04 ENCOUNTER — Ambulatory Visit (INDEPENDENT_AMBULATORY_CARE_PROVIDER_SITE_OTHER): Payer: Medicare HMO | Admitting: Family Medicine

## 2019-03-04 ENCOUNTER — Encounter: Payer: Self-pay | Admitting: Family Medicine

## 2019-03-04 VITALS — BP 136/87 | HR 66 | Temp 98.0°F | Resp 16 | Ht 62.0 in | Wt 173.2 lb

## 2019-03-04 DIAGNOSIS — M62838 Other muscle spasm: Secondary | ICD-10-CM

## 2019-03-04 MED ORDER — METAXALONE 400 MG PO TABS: 400.0000 mg | ORAL_TABLET | Freq: Two times a day (BID) | ORAL | 2 refills | Status: DC | PRN

## 2019-03-04 NOTE — Progress Notes (Signed)
Shelly Leonard , 16-Feb-1946, 73 y.o., female MRN: [id number] Patient Care Team    Relationship Specialty Notifications Start End  [name], DO PCP - General Family Medicine  08/18/15   Warden Fillers, MD Consulting Physician Ophthalmology  05/08/16   Milus Banister, MD Attending Physician Gastroenterology  05/08/16   Thornell Sartorius, MD Consulting Physician Otolaryngology  06/25/17   Manson Passey, Emerge  Specialist  07/01/18     Chief Complaint  Patient presents with  . Back Pain    x1 week. Taking Tylenol without relief. Pain Under Right shoulder blade.      Subjective: Pt presents for an OV with complaints of pain just below her right shoulder blade of 1 week duration.  Associated symptoms include discomfort with muscle skeletal movement.  Has seen some mild improvement with using a muscle rub, heat and her daughter providing her with massage.  She does not believe she had an injury that she is aware of.  She does not recall any increase in activity that would cause symptoms.  She has had similar episodes in the past that responded to muscle relaxer.  Depression screen [facility] 2/9 02/05/2019 07/01/2018 03/14/2018 09/27/2017 06/25/2017  Decreased Interest 0 0 0 0 0  Down, Depressed, Hopeless 0 0 0 3 0  PHQ - 2 Score 0 0 0 3 0  Altered sleeping - - 0 2 -  Tired, decreased energy - - 0 2 -  Change in appetite - - 0 1 -  Feeling bad or failure about yourself  - - 0 0 -  Trouble concentrating - - 0 1 -  Moving slowly or fidgety/restless - - 0 0 -  Suicidal thoughts - - 0 0 -  PHQ-9 Score - - 0 9 -  Difficult doing work/chores - - - Somewhat difficult -    No Known Allergies Social History   Social History Narrative   Ms Clendenin is widowed (12/2016).    She enjoys music and is involved with her church.   Past Medical History:  Diagnosis Date  . Allergy   . Anemia    low iron (hgb 8)  . Arthritis    knees and hands   . Carpal tunnel syndrome   . Cataract   . Diverticulitis  large intestine w/o perforation or abscess w/bleeding   . GERD (gastroesophageal reflux disease)   . Headache(784.0)    occasional   . Hypertension   . Osteopenia   . PONV (postoperative nausea and vomiting)   . Vertigo   . Vitamin D deficiency    Past Surgical History:  Procedure Laterality Date  . BREAST REDUCTION SURGERY  1982  . CARPAL TUNNEL RELEASE     left   . COLONOSCOPY    . FOOT SURGERY    . KNEE ARTHROSCOPY     right knee 2012   . OTHER SURGICAL HISTORY     spur removed top of left foot   . REDUCTION MAMMAPLASTY  1982  . TOTAL KNEE ARTHROPLASTY  04/21/2012   Procedure: TOTAL KNEE ARTHROPLASTY;  Surgeon: [name], MD;  Location: [facility];  Service: Orthopedics;  Laterality: Right;  [name] Kitchen VAGINAL HYSTERECTOMY  1977   Family History  Problem Relation Age of Onset  . Breast cancer Mother        late in life  . Diabetes Mother        late in life  . Heart disease Father   . Lung disease  Father   . Colon cancer Neg Hx   . Esophageal cancer Neg Hx   . Stomach cancer Neg Hx   . Rectal cancer Neg Hx    Allergies as of 03/04/2019   No Known Allergies     Medication List       Accurate as of March 04, 2019  1:41 PM. Always use your most recent med list.        alendronate 35 MG tablet Commonly known as:  FOSAMAX Take 1 tablet (35 mg total) by mouth every 7 (seven) days. Take with a full glass of water on an empty stomach.   aspirin EC 81 MG tablet Take 81 mg by mouth daily.   azelastine 0.1 % nasal spray Commonly known as:  ASTELIN Place 2 sprays into both nostrils 2 (two) times daily. Use in each nostril as directed   ciclopirox 0.77 % cream Commonly known as:  LOPROX APPLY  CREAM TOPICALLY TO AFFECTED AREA TWICE DAILY   fluticasone 50 MCG/ACT nasal spray Commonly known as:  FLONASE Place 2 sprays into both nostrils daily.   Garlic 767 MG Tabs Take 100 mg by mouth daily with breakfast.   loratadine 10 MG tablet Commonly known as:  CLARITIN Take  10 mg by mouth daily.   multivitamin with minerals Tabs tablet Take 1 tablet by mouth daily with breakfast.   OMEGA 3 PO Take 1 capsule by mouth daily.   pantoprazole 20 MG tablet Commonly known as:  PROTONIX Take 1 tablet (20 mg total) by mouth daily as needed.   PROBIOTIC DAILY PO Take by mouth.   triamterene-hydrochlorothiazide 75-50 MG tablet Commonly known as:  MAXZIDE TAKE 1 TABLET BY MOUTH IN THE MORNING WITH BREAKFAST   verapamil 120 MG tablet Commonly known as:  CALAN Take 1 tablet (120 mg total) by mouth daily.   VITAMIN D PO Take by mouth.       All past medical history, surgical history, allergies, family history, immunizations andmedications were updated in the EMR today and reviewed under the history and medication portions of their EMR.     ROS: Negative, with the exception of above mentioned in HPI   Objective:  BP 136/87 (BP Location: Right Arm, Patient Position: Sitting, Cuff Size: Normal)   Pulse 66   Temp 98 F (36.7 C) (Oral)   Resp 16   Ht 5\' 2"  (1.575 m)   Wt 173 lb 4 oz (78.6 kg)   SpO2 98%   BMI 31.69 kg/m  Body mass index is 31.69 kg/m. Gen: Afebrile. No acute distress. Nontoxic in appearance, well developed, well nourished.  MSK: No erythema, no soft tissue swelling.  Mild ropiness right thoracic spine.  No bony tenderness of thoracic spine or ribs. Skin: no rashes, purpura or petechiae.  Neuro: Normal gait. PERLA. EOMi. Alert. Oriented x3  No exam data present No results found. No results found for this or any previous visit (from the past 24 hour(s)).  Assessment/Plan: Shelly Leonard is a 73 y.o. female present for OV for  Muscle spasm -Believe she has a muscle strain.  Already starting to improve on its own with massage and heat.  Encouraged her to continue massage and heat if able. -Skelaxin prescribed for muscle relaxant, hopefully this will not make her tired.  Advised her that if she finds the medication too expensive, we  can call in Flexeril for her-however Flexeril will cause sedation. -Follow up in 2 weeks PRN   Reviewed expectations re:  course of current medical issues.  Discussed self-management of symptoms.  Outlined signs and symptoms indicating need for more acute intervention.  Patient verbalized understanding and all questions were answered.  Patient received an After-Visit Summary.   No orders of the defined types were placed in this encounter.   Note is dictated utilizing voice recognition software. Although note has been proof read prior to signing, occasional typographical errors still can be missed. If any questions arise, please do not hesitate to call for verification.   electronically signed by:  Howard Pouch, DO  [place]

## 2019-03-04 NOTE — Patient Instructions (Signed)
I called in the muscle relaxer called skelaxin. If it is too expensive, please call us back and I will call in the flexeril- but it will make you sleepy.    Muscle Cramps and Spasms Muscle cramps and spasms occur when a muscle or muscles tighten and you have no control over this tightening (involuntary muscle contraction). They are a common problem and can develop in any muscle. The most common place is in the calf muscles of the leg. Muscle cramps and muscle spasms are both involuntary muscle contractions, but there are some differences between the two:  Muscle cramps are painful. They come and go and may last for a few seconds or up to 15 minutes. Muscle cramps are often more forceful and last longer than muscle spasms.  Muscle spasms may or may not be painful. They may also last just a few seconds or much longer. Certain medical conditions, such as diabetes or Parkinson's disease, can make it more likely to develop cramps or spasms. However, cramps or spasms are usually not caused by a serious underlying problem. Common causes include:  Doing more physical work or exercise than your body is ready for (overexertion).  Overuse from repeating certain movements too many times.  Remaining in a certain position for a long period of time.  Improper preparation, form, or technique while playing a sport or doing an activity.  Dehydration.  Injury.  Side effects of some medicines.  Abnormally low levels of the salts and minerals in your blood (electrolytes), especially potassium and calcium. This could happen if you are taking water pills (diuretics) or if you are pregnant. In many cases, the cause of muscle cramps or spasms is not known. Follow these instructions at home: Managing pain and stiffness      Try massaging, stretching, and relaxing the affected muscle. Do this for several minutes at a time.  If directed, apply heat to tight or tense muscles as often as told by your health  care provider. Use the heat source that your health care provider recommends, such as a moist heat pack or a heating pad. ? Place a towel between your skin and the heat source. ? Leave the heat on for 20-30 minutes. ? Remove the heat if your skin turns bright red. This is especially important if you are unable to feel pain, heat, or cold. You may have a greater risk of getting burned.  If directed, put ice on the affected area. This may help if you are sore or have pain after a cramp or spasm. ? Put ice in a plastic bag. ? Place a towel between your skin and the bag. ? Leavethe ice on for 20 minutes, 2-3 times a day.  Try taking hot showers or baths to help relax tight muscles. Eating and drinking  Drink enough fluid to keep your urine pale yellow. Staying well hydrated may help prevent cramps or spasms.  Eat a healthy diet that includes plenty of nutrients to help your muscles function. A healthy diet includes fruits and vegetables, lean protein, whole grains, and low-fat or nonfat dairy products. General instructions  If you are having frequent cramps, avoid intense exercise for several days.  Take over-the-counter and prescription medicines only as told by your health care provider.  Pay attention to any changes in your symptoms.  Keep all follow-up visits as told by your health care provider. This is important. Contact a health care provider if:  Your cramps or spasms get more severe or  happen more often.  Your cramps or spasms do not improve over time. Summary  Muscle cramps and spasms occur when a muscle or muscles tighten and you have no control over this tightening (involuntary muscle contraction).  The most common place for cramps or spasms to occur is in the calf muscles of the leg.  Massaging, stretching, and relaxing the affected muscle may relieve the cramp or spasm.  Drink enough fluid to keep your urine pale yellow. Staying well hydrated may help prevent cramps  or spasms. This information is not intended to replace advice given to you by your health care provider. Make sure you discuss any questions you have with your health care provider. Document Released: 06/01/2002 Document Revised: 05/05/2018 Document Reviewed: 05/05/2018 Elsevier Interactive Patient Education  2019 [employer].

## 2019-03-05 ENCOUNTER — Encounter: Payer: Self-pay | Admitting: Family Medicine

## 2019-03-10 ENCOUNTER — Telehealth: Payer: Self-pay | Admitting: Family Medicine

## 2019-03-10 MED ORDER — TIZANIDINE HCL 2 MG PO CAPS: 2.0000 mg | ORAL_CAPSULE | Freq: Two times a day (BID) | ORAL | 5 refills | Status: DC | PRN

## 2019-03-10 NOTE — Telephone Encounter (Signed)
Called pt and LM on patients home phone VM telling patient to call insurance company and get medications that were covered and call back

## 2019-03-10 NOTE — Addendum Note (Signed)
Addended by: [name] on: 03/10/2019 04:28 PM   Modules accepted: Orders

## 2019-03-10 NOTE — Telephone Encounter (Signed)
Pt was called and given information on medication and medication changes. Pt verbalized understanding

## 2019-03-10 NOTE — Telephone Encounter (Signed)
Pt called and stated that insurance said that Tizanidine would be covered. This is a tier 2 medication. Pt would like a 90 day supply. Please advise.

## 2019-03-10 NOTE — Telephone Encounter (Signed)
Copied from [place] [id number]. Topic: Quick Communication - See Telephone Encounter >> Mar 10, 2019  9:40 AM [name], NT wrote: CRM for notification. See Telephone encounter for: 03/10/19. Pt states that her insurance will not cover the metaxalone (SKELAXIN) 400 MG tablet and she wants to see if something else can be sent in or if a PA can be done for this medication.  Please advise.

## 2019-03-10 NOTE — Telephone Encounter (Signed)
Please inform patient the following information: I called in the tizanidine, in place of the skelaxin. However, pt must be aware this med may cause drowsiness.

## 2019-06-19 ENCOUNTER — Other Ambulatory Visit (HOSPITAL_BASED_OUTPATIENT_CLINIC_OR_DEPARTMENT_OTHER): Payer: Self-pay | Admitting: Family Medicine

## 2019-06-19 DIAGNOSIS — Z1231 Encounter for screening mammogram for malignant neoplasm of breast: Secondary | ICD-10-CM

## 2019-07-01 ENCOUNTER — Ambulatory Visit: Payer: Medicare HMO | Admitting: Family Medicine

## 2019-07-06 DIAGNOSIS — H10413 Chronic giant papillary conjunctivitis, bilateral: Secondary | ICD-10-CM | POA: Diagnosis not present

## 2019-07-06 DIAGNOSIS — H16223 Keratoconjunctivitis sicca, not specified as Sjogren's, bilateral: Secondary | ICD-10-CM | POA: Diagnosis not present

## 2019-07-06 DIAGNOSIS — H2513 Age-related nuclear cataract, bilateral: Secondary | ICD-10-CM | POA: Diagnosis not present

## 2019-07-06 DIAGNOSIS — H43813 Vitreous degeneration, bilateral: Secondary | ICD-10-CM | POA: Diagnosis not present

## 2019-07-07 ENCOUNTER — Ambulatory Visit: Payer: Self-pay

## 2019-07-14 ENCOUNTER — Ambulatory Visit (HOSPITAL_BASED_OUTPATIENT_CLINIC_OR_DEPARTMENT_OTHER)
Admission: RE | Admit: 2019-07-14 | Discharge: 2019-07-14 | Disposition: A | Discharge status: Home / Self Care | Payer: Medicare HMO | Source: Ambulatory Visit | Attending: Family Medicine | Admitting: Family Medicine

## 2019-07-14 ENCOUNTER — Other Ambulatory Visit: Payer: Self-pay

## 2019-07-14 ENCOUNTER — Encounter (HOSPITAL_BASED_OUTPATIENT_CLINIC_OR_DEPARTMENT_OTHER): Payer: Self-pay

## 2019-07-14 DIAGNOSIS — Z1231 Encounter for screening mammogram for malignant neoplasm of breast: Secondary | ICD-10-CM | POA: Insufficient documentation

## 2019-07-16 ENCOUNTER — Encounter: Payer: Self-pay | Admitting: Family Medicine

## 2019-07-16 ENCOUNTER — Other Ambulatory Visit: Payer: Self-pay

## 2019-07-16 ENCOUNTER — Ambulatory Visit (INDEPENDENT_AMBULATORY_CARE_PROVIDER_SITE_OTHER): Payer: Medicare HMO | Admitting: Family Medicine

## 2019-07-16 VITALS — BP 131/79 | HR 80 | Temp 97.9°F | Resp 17 | Ht 62.0 in | Wt 168.5 lb

## 2019-07-16 DIAGNOSIS — M545 Low back pain, unspecified: Secondary | ICD-10-CM

## 2019-07-16 DIAGNOSIS — K5792 Diverticulitis of intestine, part unspecified, without perforation or abscess without bleeding: Secondary | ICD-10-CM | POA: Diagnosis not present

## 2019-07-16 MED ORDER — CIPROFLOXACIN HCL 500 MG PO TABS: 500.0000 mg | ORAL_TABLET | Freq: Two times a day (BID) | ORAL | 0 refills | Status: AC

## 2019-07-16 MED ORDER — METRONIDAZOLE 500 MG PO TABS: 500.0000 mg | ORAL_TABLET | Freq: Three times a day (TID) | ORAL | 0 refills | Status: AC

## 2019-07-16 MED ORDER — HYDROCODONE-ACETAMINOPHEN 5-325 MG PO TABS: ORAL_TABLET | ORAL | 0 refills | Status: DC

## 2019-07-16 NOTE — Progress Notes (Signed)
OFFICE VISIT  07/16/2019   CC:  Chief Complaint  Patient presents with  . Groin Pain    left side and going into her lower back. having some issues using the restroom.   HPI:    Patient is a 73 y.o. African-American female who presents for back pain. Onset about 3 d/a of pain in LLQ that extends around her side to low back above crest of L hip. This is c/w her past episodes of diverticulitis.  No fevers.  PO intake is fine. BM's lately harder/firmer.  No hematochezia or melena. Has past hx of episodic L LB pain that sometimes occurs totally separate from any abd pains. This is how her back feels now.  ROS: no CP, no SOB, no wheezing, no cough, no dizziness, no HAs, no rashes, no melena/hematochezia.  No polyuria or polydipsia.     Past Medical History:  Diagnosis Date  . Allergy   . Anemia    low iron (hgb 8)  . Arthritis    knees and hands   . Carpal tunnel syndrome   . Cataract   . Diverticulitis large intestine w/o perforation or abscess w/bleeding   . GERD (gastroesophageal reflux disease)   . Headache(784.0)    occasional   . Hypertension   . Lumbar spondylosis   . Osteopenia   . PONV (postoperative nausea and vomiting)   . Vertigo   . Vitamin D deficiency     Past Surgical History:  Procedure Laterality Date  . BREAST REDUCTION SURGERY  1982  . CARPAL TUNNEL RELEASE     left   . COLONOSCOPY    . FOOT SURGERY    . KNEE ARTHROSCOPY     right knee 2012   . OTHER SURGICAL HISTORY     spur removed top of left foot   . REDUCTION MAMMAPLASTY  1982  . TOTAL KNEE ARTHROPLASTY  04/21/2012   Procedure: TOTAL KNEE ARTHROPLASTY;  Surgeon: [name], MD;  Location: [facility];  Service: Orthopedics;  Laterality: Right;  [name] Kitchen VAGINAL HYSTERECTOMY  1977    Outpatient Medications Prior to Visit  Medication Sig Dispense Refill  . aspirin EC 81 MG tablet Take 81 mg by mouth daily.    [name] Kitchen azelastine (ASTELIN) 0.1 % nasal spray Place 2 sprays into both nostrils 2 (two) times  daily. Use in each nostril as directed 30 mL 12  . Cholecalciferol (VITAMIN D PO) Take by mouth.    . fluticasone (FLONASE) 50 MCG/ACT nasal spray Place 2 sprays into both nostrils daily. (Patient taking differently: Place 2 sprays into both nostrils daily as needed. ) 16 g 6  . Garlic 292 MG TABS Take 100 mg by mouth daily with breakfast.     . loratadine (CLARITIN) 10 MG tablet Take 10 mg by mouth daily.    . Multiple Vitamin (MULITIVITAMIN WITH MINERALS) TABS Take 1 tablet by mouth daily with breakfast.    . Omega-3 Fatty Acids (OMEGA 3 PO) Take 1 capsule by mouth daily.    . pantoprazole (PROTONIX) 20 MG tablet Take 1 tablet (20 mg total) by mouth daily as needed. 90 tablet 1  . Probiotic Product (PROBIOTIC DAILY PO) Take by mouth.    . triamterene-hydrochlorothiazide (MAXZIDE) 75-50 MG tablet TAKE 1 TABLET BY MOUTH IN THE MORNING WITH BREAKFAST 90 tablet 1  . verapamil (CALAN) 120 MG tablet Take 1 tablet (120 mg total) by mouth daily. 90 tablet 1  . alendronate (FOSAMAX) 35 MG tablet Take 1 tablet (  35 mg total) by mouth every 7 (seven) days. Take with a full glass of water on an empty stomach. (Patient not taking: Reported on 07/16/2019) 90 tablet 3  . ciclopirox (LOPROX) 0.77 % cream APPLY  CREAM TOPICALLY TO AFFECTED AREA TWICE DAILY (Patient not taking: Reported on 07/16/2019) 30 g 11  . tizanidine (ZANAFLEX) 2 MG capsule Take 1 capsule (2 mg total) by mouth 2 (two) times daily as needed for muscle spasms. 60 capsule 5   No facility-administered medications prior to visit.     No Known Allergies  ROS As per HPI  PE: Blood pressure 131/79, pulse 80, temperature 97.9 F (36.6 C), temperature source Temporal, resp. rate 17, height 5\' 2"  (1.575 m), weight 168 lb 8 oz (76.4 kg), SpO2 97 %. Gen: Alert, well appearing.  Patient is oriented to person, place, time, and situation. AFFECT: pleasant, lucid thought and speech. CV: RRR, no m/r/g.   LUNGS: CTA bilat, nonlabored resps, good  aeration in all lung fields. BACK: TTP in LLB diffusely.  No spinal tenderness.   ABD: soft, non distended, BS normal.  No mass or HSM or bruit. Moderate/severe LLQ TTP, no guarding or rebound.  LABS:    Chemistry      Component Value Date/Time   NA 136 02/03/2019 1323   NA 137 03/01/2015   K 3.7 02/03/2019 1323   K 3.7 03/01/2015   CL 99 02/03/2019 1323   CL 102 03/01/2015   CO2 29 02/03/2019 1323   CO2 30 03/01/2015   BUN 16 02/03/2019 1323   BUN 11 03/01/2015   CREATININE 0.61 02/03/2019 1323   CREATININE 0.57 03/01/2015      Component Value Date/Time   CALCIUM 10.6 (H) 02/03/2019 1323   CALCIUM 10.3 03/01/2015   ALKPHOS 42 02/03/2019 1323   AST 18 02/03/2019 1323   AST 18 03/01/2015   ALT 17 02/03/2019 1323   ALT 19 03/01/2015   BILITOT 0.8 02/03/2019 1323     Lab Results  Component Value Date   WBC 9.8 03/24/2018   HGB 12.5 03/24/2018   HCT 38.2 03/24/2018   MCV 86.3 03/24/2018   PLT 287.0 03/24/2018   MRI L spine 04/03/2007: IMPRESSION:   1. The most significant findings in this patient with right leg pain are at L4-5 where there is a broad based posterolateral and foraminal disk protrusion on the right causing right L4 nerve root compression. There is also mild to moderate central and right greater than left lateral recess stenosis at that level.  2. Significant degenerative disk disease is also present at L5-S1 where there is a broad based central and left-sided disk protrusion causing significant left lateral recess and foraminal stenosis. Left L5 nerve root compression is highly likely. Correlate clinically.   3. Lesser spondylosis at L2-3 and L3-4 as detailed above. There is no gross nerve root compression at these levels.  4. The sagittal images demonstrate disk bulging and facet hypertrophy at T11-12 with resulting effacement of the CSF surrounding the cord and probable slight cord flattening. This could be symptomatic; correlate clinically. If warranted  clinically, this could be further evaluated with dedicated MRI of the thoracic spine.   IMPRESSION AND PLAN:  1) Acute diverticulitis: flagyl 500 mg tid x 10d and cipro 500 mg bid x 10d. Vicodin 5/325, 1-2 q6h prn pain, #15. Therapeutic expectations and side effect profile of medication discussed today.  Patient's questions answered.  2) Acute flare of her episodic L LB musculoskeletal pain w/out  radiculopathy. This may have flared up b/c of the way she holds her upper body when she is hurting in lower abdomen. Hopefully the vicodin I rx'd short term with help this as well.  An After Visit Summary was printed and given to the patient.  FOLLOW UP: Return if symptoms worsen or fail to improve.  Signed:  Crissie Sickles, MD           07/16/2019

## 2019-07-20 ENCOUNTER — Telehealth: Payer: Self-pay

## 2019-07-20 ENCOUNTER — Telehealth: Payer: Self-pay | Admitting: Family Medicine

## 2019-07-20 NOTE — Telephone Encounter (Signed)
error 

## 2019-07-20 NOTE — Telephone Encounter (Signed)
I did not see this pt recently. Believe she may have been seen by another provider. If she desires to discuss, would suggest she make at least a phone visit for followup with myself

## 2019-07-20 NOTE — Telephone Encounter (Signed)
Pt was called and detailed message was left letting pt know that we could do a telephone/virtual visit just for her to speak with Dr Raoul Pitch, go over symptoms, and discuss not being able to take abx. Pt was given phone number and told to return call to schedule

## 2019-07-20 NOTE — Telephone Encounter (Signed)
Pt called and reports she feels tired, weak, and loss of appetite. Pt read side effects from abx's and stopped taking them because she feels this was the cause. Pt is asking what she needs to do at this time. Pt reports she is feeling somewhat better this morning and is not sure if there is another abx that can be given.

## 2019-07-21 ENCOUNTER — Encounter: Payer: Self-pay | Admitting: Family Medicine

## 2019-07-21 ENCOUNTER — Ambulatory Visit (INDEPENDENT_AMBULATORY_CARE_PROVIDER_SITE_OTHER): Payer: Medicare HMO | Admitting: Family Medicine

## 2019-07-21 ENCOUNTER — Other Ambulatory Visit: Payer: Self-pay

## 2019-07-21 DIAGNOSIS — K5732 Diverticulitis of large intestine without perforation or abscess without bleeding: Secondary | ICD-10-CM

## 2019-07-21 MED ORDER — AMOXICILLIN-POT CLAVULANATE 875-125 MG PO TABS: 1.0000 | ORAL_TABLET | Freq: Two times a day (BID) | ORAL | 0 refills | Status: DC

## 2019-07-21 NOTE — Progress Notes (Signed)
Telephone visit-no access to Internet  I connected with [name] on 07/21/19 at  8:30 AM EDT by telemedicine  and verified that I am speaking with the correct person using two identifiers. Location patient: Home Location provider: [facility], Office Persons participating in the virtual visit: Patient, Dr. Raoul Pitch and R.Baker, LPN  I discussed the limitations of evaluation and management by telemedicine and the availability of in person appointments. The patient expressed understanding and agreed to proceed.   Shelly Leonard , 1946-07-26, 73 y.o., female MRN: [id number] Patient Care Team    Relationship Specialty Notifications Start End  [name], DO PCP - General Family Medicine  08/18/15   Warden Fillers, MD Consulting Physician Ophthalmology  05/08/16   Milus Banister, MD Attending Physician Gastroenterology  05/08/16   Thornell Sartorius, MD Consulting Physician Otolaryngology  06/25/17   Manson Passey, [facility]  Specialist  07/01/18     Chief Complaint  Patient presents with  . Flank Pain    stabbing pain on the left side. dark stool. diarrhea. no vomitting or nausea. no fever. x1week. saw Dr. Anitra Lauth last week 07/16/19     Subjective: Pt presents for an OV with complaints of worsening abdominal discomfort of greater than 10 days duration.  Associated symptoms include diarrhea, intermittent darker stools, fatigue and decreased appetite.  She was seen last week by another provider and diagnosed with diverticulitis.  Records were reviewed from the office visit today.  She has a history of diverticulosis.  She was prescribed Cipro/Flagyl and she states that she developed restlessness, body aches and had difficulty sleeping after starting the medicine.  She denies any fever, chills, nausea or vomit.  She is tolerating p.o. be eating a very bland diet consisting of toast, soup and Jell-O.  She is hydrating.    Pt has tried Tylenol to ease their symptoms.   Depression  screen [facility] 2/9 02/05/2019 07/01/2018 03/14/2018 09/27/2017 06/25/2017  Decreased Interest 0 0 0 0 0  Down, Depressed, Hopeless 0 0 0 3 0  PHQ - 2 Score 0 0 0 3 0  Altered sleeping - - 0 2 -  Tired, decreased energy - - 0 2 -  Change in appetite - - 0 1 -  Feeling bad or failure about yourself  - - 0 0 -  Trouble concentrating - - 0 1 -  Moving slowly or fidgety/restless - - 0 0 -  Suicidal thoughts - - 0 0 -  PHQ-9 Score - - 0 9 -  Difficult doing work/chores - - - Somewhat difficult -    No Known Allergies Social History   Social History Narrative   Ms Elgersma is widowed (12/2016).    She enjoys music and is involved with her church.   Past Medical History:  Diagnosis Date  . Allergy   . Anemia    low iron (hgb 8)  . Arthritis    knees and hands   . Carpal tunnel syndrome   . Cataract   . Diverticulitis large intestine w/o perforation or abscess w/bleeding   . GERD (gastroesophageal reflux disease)   . Headache(784.0)    occasional   . Hypertension   . Lumbar spondylosis   . Osteopenia   . PONV (postoperative nausea and vomiting)   . Vertigo   . Vitamin D deficiency    Past Surgical History:  Procedure Laterality Date  . BREAST REDUCTION SURGERY  1982  . CARPAL TUNNEL RELEASE  left   . COLONOSCOPY    . FOOT SURGERY    . KNEE ARTHROSCOPY     right knee 2012   . OTHER SURGICAL HISTORY     spur removed top of left foot   . REDUCTION MAMMAPLASTY  1982  . TOTAL KNEE ARTHROPLASTY  04/21/2012   Procedure: TOTAL KNEE ARTHROPLASTY;  Surgeon: [name], MD;  Location: [facility];  Service: Orthopedics;  Laterality: Right;  [name] Kitchen VAGINAL HYSTERECTOMY  1977   Family History  Problem Relation Age of Onset  . Breast cancer Mother        late in life  . Diabetes Mother        late in life  . Heart disease Father   . Lung disease Father   . Colon cancer Neg Hx   . Esophageal cancer Neg Hx   . Stomach cancer Neg Hx   . Rectal cancer Neg Hx    Allergies as of 07/21/2019    No Known Allergies     Medication List       Accurate as of July 21, 2019  8:32 AM. If you have any questions, ask your nurse or doctor.        alendronate 35 MG tablet Commonly known as: FOSAMAX Take 1 tablet (35 mg total) by mouth every 7 (seven) days. Take with a full glass of water on an empty stomach.   aspirin EC 81 MG tablet Take 81 mg by mouth daily.   azelastine 0.1 % nasal spray Commonly known as: ASTELIN Place 2 sprays into both nostrils 2 (two) times daily. Use in each nostril as directed   ciclopirox 0.77 % cream Commonly known as: LOPROX APPLY  CREAM TOPICALLY TO AFFECTED AREA TWICE DAILY   ciprofloxacin 500 MG tablet Commonly known as: CIPRO Take 1 tablet (500 mg total) by mouth 2 (two) times daily for 10 days.   fluticasone 50 MCG/ACT nasal spray Commonly known as: FLONASE Place 2 sprays into both nostrils daily. What changed:   when to take this  reasons to take this   Garlic 413 MG Tabs Take 100 mg by mouth daily with breakfast.   HYDROcodone-acetaminophen 5-325 MG tablet Commonly known as: NORCO/VICODIN 1-2 tabs po q6h prn pain   loratadine 10 MG tablet Commonly known as: CLARITIN Take 10 mg by mouth daily.   metroNIDAZOLE 500 MG tablet Commonly known as: FLAGYL Take 1 tablet (500 mg total) by mouth 3 (three) times daily for 10 days.   multivitamin with minerals Tabs tablet Take 1 tablet by mouth daily with breakfast.   OMEGA 3 PO Take 1 capsule by mouth daily.   pantoprazole 20 MG tablet Commonly known as: PROTONIX Take 1 tablet (20 mg total) by mouth daily as needed.   PROBIOTIC DAILY PO Take by mouth.   tizanidine 2 MG capsule Commonly known as: Zanaflex Take 1 capsule (2 mg total) by mouth 2 (two) times daily as needed for muscle spasms.   triamterene-hydrochlorothiazide 75-50 MG tablet Commonly known as: MAXZIDE TAKE 1 TABLET BY MOUTH IN THE MORNING WITH BREAKFAST   verapamil 120 MG tablet Commonly known as: CALAN  Take 1 tablet (120 mg total) by mouth daily.   VITAMIN D PO Take by mouth.       All past medical history, surgical history, allergies, family history, immunizations andmedications were updated in the EMR today and reviewed under the history and medication portions of their EMR.     ROS: Negative, with the  exception of above mentioned in HPI   Objective:  There were no vitals taken for this visit. There is no height or weight on file to calculate BMI. Gen: No acute distress. Nontoxic  Abd: Patient reports mild discomfort to palpation left side of abdomen. NTND.  Neuro: . Alert. Oriented x3   No exam data present No results found. No results found for this or any previous visit (from the past 24 hour(s)).  Assessment/Plan: KAITYLN KALLSTROM is a 73 y.o. female present for OV for  Diverticulitis of colon -Patient had side effects to antibiotics prescribed last week and discontinued. -Continue bland soft diet for another 48 hours.  Then slowly advance to soft bland diet.  Avoid dairy products until complete resolution of symptoms. -Start Augmentin twice daily x14 days with food. -Tylenol can be used for discomfort -Remain hydrated -Follow-up in 2 weeks if abdominal pain is not resolved.  Patient aware to follow-up immediately if pain worsens, stools become dark again or develops fever.    Reviewed expectations re: course of current medical issues.  Discussed self-management of symptoms.  Outlined signs and symptoms indicating need for more acute intervention.  Patient verbalized understanding and all questions were answered.  Patient received an After-Visit Summary.  Greater than 25 minutes spent with patient.  No orders of the defined types were placed in this encounter.    Note is dictated utilizing voice recognition software. Although note has been proof read prior to signing, occasional typographical errors still can be missed. If any questions arise, please do  not hesitate to call for verification.   electronically signed by:  Howard Pouch, DO  [place]

## 2019-08-04 ENCOUNTER — Ambulatory Visit (INDEPENDENT_AMBULATORY_CARE_PROVIDER_SITE_OTHER): Payer: Medicare HMO

## 2019-08-04 ENCOUNTER — Other Ambulatory Visit: Payer: Self-pay

## 2019-08-04 VITALS — BP 118/70 | HR 65 | Ht 62.0 in | Wt 174.1 lb

## 2019-08-04 DIAGNOSIS — E669 Obesity, unspecified: Secondary | ICD-10-CM

## 2019-08-04 DIAGNOSIS — Z0000 Encounter for general adult medical examination without abnormal findings: Secondary | ICD-10-CM

## 2019-08-04 DIAGNOSIS — Z23 Encounter for immunization: Secondary | ICD-10-CM

## 2019-08-04 MED ORDER — SHINGRIX 50 MCG/0.5ML IM SUSR: 0.5000 mL | Freq: Once | INTRAMUSCULAR | 1 refills | Status: AC

## 2019-08-04 NOTE — Patient Instructions (Addendum)
Shingles vaccine at pharmacy.   Bring a copy of your living will and/or healthcare power of attorney to your next office visit.  Continue doing brain stimulating activities (puzzles, reading, adult coloring books, staying active) to keep memory sharp.    Fall Prevention in the Home, Adult Falls can cause injuries. They can happen to people of all ages. There are many things you can do to make your home safe and to help prevent falls. Ask for help when making these changes, if needed. What actions can I take to prevent falls? General Instructions  Use good lighting in all rooms. Replace any light bulbs that burn out.  Turn on the lights when you go into a dark area. Use night-lights.  Keep items that you use often in easy-to-reach places. Lower the shelves around your home if necessary.  Set up your furniture so you have a clear path. Avoid moving your furniture around.  Do not have throw rugs and other things on the floor that can make you trip.  Avoid walking on wet floors.  If any of your floors are uneven, fix them.  Add color or contrast paint or tape to clearly mark and help you see: ? Any grab bars or handrails. ? First and last steps of stairways. ? Where the edge of each step is.  If you use a stepladder: ? Make sure that it is fully opened. Do not climb a closed stepladder. ? Make sure that both sides of the stepladder are locked into place. ? Ask someone to hold the stepladder for you while you use it.  If there are any pets around you, be aware of where they are. What can I do in the bathroom?      Keep the floor dry. Clean up any water that spills onto the floor as soon as it happens.  Remove soap buildup in the tub or shower regularly.  Use non-skid mats or decals on the floor of the tub or shower.  Attach bath mats securely with double-sided, non-slip rug tape.  If you need to sit down in the shower, use a plastic, non-slip stool.  Install grab bars  by the toilet and in the tub and shower. Do not use towel bars as grab bars. What can I do in the bedroom?  Make sure that you have a light by your bed that is easy to reach.  Do not use any sheets or blankets that are too big for your bed. They should not hang down onto the floor.  Have a firm chair that has side arms. You can use this for support while you get dressed. What can I do in the kitchen?  Clean up any spills right away.  If you need to reach something above you, use a strong step stool that has a grab bar.  Keep electrical cords out of the way.  Do not use floor polish or wax that makes floors slippery. If you must use wax, use non-skid floor wax. What can I do with my stairs?  Do not leave any items on the stairs.  Make sure that you have a light switch at the top of the stairs and the bottom of the stairs. If you do not have them, ask someone to add them for you.  Make sure that there are handrails on both sides of the stairs, and use them. Fix handrails that are broken or loose. Make sure that handrails are as long as the stairways.  Install non-slip stair treads on all stairs in your home.  Avoid having throw rugs at the top or bottom of the stairs. If you do have throw rugs, attach them to the floor with carpet tape.  Choose a carpet that does not hide the edge of the steps on the stairway.  Check any carpeting to make sure that it is firmly attached to the stairs. Fix any carpet that is loose or worn. What can I do on the outside of my home?  Use bright outdoor lighting.  Regularly fix the edges of walkways and driveways and fix any cracks.  Remove anything that might make you trip as you walk through a door, such as a raised step or threshold.  Trim any bushes or trees on the path to your home.  Regularly check to see if handrails are loose or broken. Make sure that both sides of any steps have handrails.  Install guardrails along the edges of any  raised decks and porches.  Clear walking paths of anything that might make someone trip, such as tools or rocks.  Have any leaves, snow, or ice cleared regularly.  Use sand or salt on walking paths during winter.  Clean up any spills in your garage right away. This includes grease or oil spills. What other actions can I take?  Wear shoes that: ? Have a low heel. Do not wear high heels. ? Have rubber bottoms. ? Are comfortable and fit you well. ? Are closed at the toe. Do not wear open-toe sandals.  Use tools that help you move around (mobility aids) if they are needed. These include: ? Canes. ? Walkers. ? Scooters. ? Crutches.  Review your medicines with your doctor. Some medicines can make you feel dizzy. This can increase your chance of falling. Ask your doctor what other things you can do to help prevent falls. Where to find more information  Centers for Disease Control and Prevention, STEADI: https://garcia.biz/  [employer] on Aging: BrainJudge.co.uk Contact a doctor if:  You are afraid of falling at home.  You feel weak, drowsy, or dizzy at home.  You fall at home. Summary  There are many simple things that you can do to make your home safe and to help prevent falls.  Ways to make your home safe include removing tripping hazards and installing grab bars in the bathroom.  Ask for help when making these changes in your home. This information is not intended to replace advice given to you by your health care provider. Make sure you discuss any questions you have with your health care provider. Document Released: 10/06/2009 Document Revised: 04/02/2019 Document Reviewed: 07/25/2017 Elsevier Patient Education  2020 [place] Maintenance, Female Adopting a healthy lifestyle and getting preventive care are important in promoting health and wellness. Ask your health care provider about:  The right schedule for you to have regular tests and  exams.  Things you can do on your own to prevent diseases and keep yourself healthy. What should I know about diet, weight, and exercise? Eat a healthy diet   Eat a diet that includes plenty of vegetables, fruits, low-fat dairy products, and lean protein.  Do not eat a lot of foods that are high in solid fats, added sugars, or sodium. Maintain a healthy weight Body mass index (BMI) is used to identify weight problems. It estimates body fat based on height and weight. Your health care provider can help determine your BMI and help you  achieve or maintain a healthy weight. Get regular exercise Get regular exercise. This is one of the most important things you can do for your health. Most adults should:  Exercise for at least 150 minutes each week. The exercise should increase your heart rate and make you sweat (moderate-intensity exercise).  Do strengthening exercises at least twice a week. This is in addition to the moderate-intensity exercise.  Spend less time sitting. Even light physical activity can be beneficial. Watch cholesterol and blood lipids Have your blood tested for lipids and cholesterol at 72 years of age, then have this test every 5 years. Have your cholesterol levels checked more often if:  Your lipid or cholesterol levels are high.  You are older than 72 years of age.  You are at high risk for heart disease. What should I know about cancer screening? Depending on your health history and family history, you may need to have cancer screening at various ages. This may include screening for:  Breast cancer.  Cervical cancer.  Colorectal cancer.  Skin cancer.  Lung cancer. What should I know about heart disease, diabetes, and high blood pressure? Blood pressure and heart disease  High blood pressure causes heart disease and increases the risk of stroke. This is more likely to develop in people who have high blood pressure readings, are of African descent, or are  overweight.  Have your blood pressure checked: ? Every 3-5 years if you are 62-41 years of age. ? Every year if you are 108 years old or older. Diabetes Have regular diabetes screenings. This checks your fasting blood sugar level. Have the screening done:  Once every three years after age 53 if you are at a normal weight and have a low risk for diabetes.  More often and at a younger age if you are overweight or have a high risk for diabetes. What should I know about preventing infection? Hepatitis B If you have a higher risk for hepatitis B, you should be screened for this virus. Talk with your health care provider to find out if you are at risk for hepatitis B infection. Hepatitis C Testing is recommended for:  Everyone born from [birth year] through 1964.  Anyone with known risk factors for hepatitis C. Sexually transmitted infections (STIs)  Get screened for STIs, including gonorrhea and chlamydia, if: ? You are sexually active and are younger than 72 years of age. ? You are older than 72 years of age and your health care provider tells you that you are at risk for this type of infection. ? Your sexual activity has changed since you were last screened, and you are at increased risk for chlamydia or gonorrhea. Ask your health care provider if you are at risk.  Ask your health care provider about whether you are at high risk for HIV. Your health care provider may recommend a prescription medicine to help prevent HIV infection. If you choose to take medicine to prevent HIV, you should first get tested for HIV. You should then be tested every 3 months for as long as you are taking the medicine. Pregnancy  If you are about to stop having your period (premenopausal) and you may become pregnant, seek counseling before you get pregnant.  Take 400 to 800 micrograms (mcg) of folic acid every day if you become pregnant.  Ask for birth control (contraception) if you want to prevent pregnancy.  Osteoporosis and menopause Osteoporosis is a disease in which the bones lose minerals and strength with  aging. This can result in bone fractures. If you are 29 years old or older, or if you are at risk for osteoporosis and fractures, ask your health care provider if you should:  Be screened for bone loss.  Take a calcium or vitamin D supplement to lower your risk of fractures.  Be given hormone replacement therapy (HRT) to treat symptoms of menopause. Follow these instructions at home: Lifestyle  Do not use any products that contain nicotine or tobacco, such as cigarettes, e-cigarettes, and chewing tobacco. If you need help quitting, ask your health care provider.  Do not use street drugs.  Do not share needles.  Ask your health care provider for help if you need support or information about quitting drugs. Alcohol use  Do not drink alcohol if: ? Your health care provider tells you not to drink. ? You are pregnant, may be pregnant, or are planning to become pregnant.  If you drink alcohol: ? Limit how much you use to 0-1 drink a day. ? Limit intake if you are breastfeeding.  Be aware of how much alcohol is in your drink. In the [place] one drink equals one 12 oz bottle of beer (355 mL), one 5 oz glass of wine (148 mL), or one 1 oz glass of hard liquor (44 mL). General instructions  Schedule regular health, dental, and eye exams.  Stay current with your vaccines.  Tell your health care provider if: ? You often feel depressed. ? You have ever been abused or do not feel safe at home. Summary  Adopting a healthy lifestyle and getting preventive care are important in promoting health and wellness.  Follow your health care provider's instructions about healthy diet, exercising, and getting tested or screened for diseases.  Follow your health care provider's instructions on monitoring your cholesterol and blood pressure. This information is not intended to replace advice given  to you by your health care provider. Make sure you discuss any questions you have with your health care provider. Document Released: 06/25/2011 Document Revised: 12/03/2018 Document Reviewed: 12/03/2018 Elsevier Patient Education  2020 [employer].

## 2019-08-04 NOTE — Progress Notes (Addendum)
Subjective:   Shelly Leonard is a 73 y.o. female who presents for Medicare Annual (Subsequent) preventive examination.  Review of Systems:  No ROS.  Medicare Wellness Visit.    See social history for additional risk factors.  Cardiac Risk Factors include: advanced age (>70men, >8 women);hypertension;dyslipidemia;obesity (BMI >30kg/m2);sedentary lifestyle;family history of premature cardiovascular disease   Sleep patterns: Sleeps "okay".  Home Safety/Smoke Alarms: Feels safe in home. Smoke alarms in place.  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  Seat Belt Safety/Bike Helmet: Wears seat belt.   Female:   Pap-N/A       Mammo-07/14/2019,  BI-RADS CATEGORY  1: Negative.      Dexa scan-07/08/2018, Osteopenia.        [facility]S-Colonoscopy 05/29/2016, polyp. Recall 10 years.      Objective:     Vitals: BP 118/70 (BP Location: Left Arm, Patient Position: Sitting, Cuff Size: Normal)   Pulse 65   Ht 5\' 2"  (1.575 m)   SpO2 98%   BMI 30.82 kg/m   Body mass index is 30.82 kg/m.  Advanced Directives 08/04/2019 07/01/2018 06/25/2017 08/09/2016 05/29/2016 05/08/2016 02/13/2015  Does Patient Have a Medical Advance Directive? No No No No No No No  Would patient like information on creating a medical advance directive? No - Patient declined Yes (MAU/Ambulatory/Procedural Areas - Information given) Yes (MAU/Ambulatory/Procedural Areas - Information given) - - Yes - Educational materials given No - patient declined information  Pre-existing out of facility DNR order (yellow form or pink MOST form) - - - - - - -    Tobacco Social History   Tobacco Use  Smoking Status Never Smoker  Smokeless Tobacco Never Used     Counseling given: Not Answered    Past Medical History:  Diagnosis Date  . Allergy   . Anemia    low iron (hgb 8)  . Arthritis    knees and hands   . Carpal tunnel syndrome   . Cataract   . Diverticulitis large  intestine w/o perforation or abscess w/bleeding   . GERD (gastroesophageal reflux disease)   . Headache(784.0)    occasional   . Hypertension   . Lumbar spondylosis   . Osteopenia   . PONV (postoperative nausea and vomiting)   . Vertigo   . Vitamin D deficiency    Past Surgical History:  Procedure Laterality Date  . BREAST REDUCTION SURGERY  1982  . CARPAL TUNNEL RELEASE     left   . COLONOSCOPY    . FOOT SURGERY    . KNEE ARTHROSCOPY     right knee 2012   . OTHER SURGICAL HISTORY     spur removed top of left foot   . REDUCTION MAMMAPLASTY  1982  . TOTAL KNEE ARTHROPLASTY  04/21/2012   Procedure: TOTAL KNEE ARTHROPLASTY;  Surgeon: [name], MD;  Location: [facility];  Service: Orthopedics;  Laterality: Right;  [name] Kitchen VAGINAL HYSTERECTOMY  1977   Family History  Problem Relation Age of Onset  . Breast cancer Mother        late in life  . Diabetes Mother        late in life  . Heart disease Father   . Lung disease Father   . Colon cancer Neg Hx   . Esophageal cancer Neg Hx   . Stomach cancer Neg Hx   . Rectal cancer Neg Hx    Social History   Socioeconomic History  .  Marital status: Widowed    Spouse name: Not on file  . Number of children: 2  . Years of education: Not on file  . Highest education level: Not on file  Occupational History  . Not on file  Social Needs  . Financial resource strain: Not on file  . Food insecurity    Worry: Not on file    Inability: Not on file  . Transportation needs    Medical: Not on file    Non-medical: Not on file  Tobacco Use  . Smoking status: Never Smoker  . Smokeless tobacco: Never Used  Substance and Sexual Activity  . Alcohol use: No  . Drug use: No  . Sexual activity: Yes    Birth control/protection: Post-menopausal  Lifestyle  . Physical activity    Days per week: Not on file    Minutes per session: Not on file  . Stress: Not on file  Relationships  . Social Herbalist on phone: Not on file    Gets  together: Not on file    Attends religious service: Not on file    Active member of club or organization: Not on file    Attends meetings of clubs or organizations: Not on file    Relationship status: Not on file  Other Topics Concern  . Not on file  Social History Narrative   Ms Sandell is widowed (12/2016).    She enjoys music and is involved with her church.    Outpatient Encounter Medications as of 08/04/2019  Medication Sig  . amoxicillin-clavulanate (AUGMENTIN) 875-125 MG tablet Take 1 tablet by mouth 2 (two) times daily.  [name] Kitchen aspirin EC 81 MG tablet Take 81 mg by mouth daily.  [name] Kitchen azelastine (ASTELIN) 0.1 % nasal spray Place 2 sprays into both nostrils 2 (two) times daily. Use in each nostril as directed  . Cholecalciferol (VITAMIN D PO) Take by mouth.  . ciclopirox (LOPROX) 0.77 % cream APPLY  CREAM TOPICALLY TO AFFECTED AREA TWICE DAILY  . fluticasone (FLONASE) 50 MCG/ACT nasal spray Place 2 sprays into both nostrils daily. (Patient taking differently: Place 2 sprays into both nostrils daily as needed. )  . Garlic 426 MG TABS Take 100 mg by mouth daily with breakfast.   . HYDROcodone-acetaminophen (NORCO/VICODIN) 5-325 MG tablet 1-2 tabs po q6h prn pain  . loratadine (CLARITIN) 10 MG tablet Take 10 mg by mouth daily.  . Multiple Vitamin (MULITIVITAMIN WITH MINERALS) TABS Take 1 tablet by mouth daily with breakfast.  . Omega-3 Fatty Acids (OMEGA 3 PO) Take 1 capsule by mouth daily.  . pantoprazole (PROTONIX) 20 MG tablet Take 1 tablet (20 mg total) by mouth daily as needed.  . Probiotic Product (PROBIOTIC DAILY PO) Take by mouth.  . tizanidine (ZANAFLEX) 2 MG capsule Take 1 capsule (2 mg total) by mouth 2 (two) times daily as needed for muscle spasms.  [name] Kitchen triamterene-hydrochlorothiazide (MAXZIDE) 75-50 MG tablet TAKE 1 TABLET BY MOUTH IN THE MORNING WITH BREAKFAST  . verapamil (CALAN) 120 MG tablet Take 1 tablet (120 mg total) by mouth daily.  [name] Kitchen Zoster Vaccine Adjuvanted [facility])  injection Inject 0.5 mLs into the muscle once for 1 dose.  . [DISCONTINUED] alendronate (FOSAMAX) 35 MG tablet Take 1 tablet (35 mg total) by mouth every 7 (seven) days. Take with a full glass of water on an empty stomach. (Patient not taking: Reported on 07/21/2019)   No facility-administered encounter medications on file as of 08/04/2019.  Activities of Daily Living In your present state of health, do you have any difficulty performing the following activities: 08/04/2019  Hearing? N  Vision? N  Difficulty concentrating or making decisions? N  Walking or climbing stairs? N  Dressing or bathing? N  Doing errands, shopping? N  Preparing Food and eating ? N  Using the Toilet? N  In the past six months, have you accidently leaked urine? N  Do you have problems with loss of bowel control? N  Managing your Medications? N  Managing your Finances? N  Housekeeping or managing your Housekeeping? N  Some recent data might be hidden    Patient Care Team: [name], DO as PCP - General (Family Medicine) Warden Fillers, MD as Consulting Physician (Ophthalmology) Milus Banister, MD as Attending Physician (Gastroenterology) Thornell Sartorius, MD as Consulting Physician (Otolaryngology) [facility] (Specialist)    Assessment:   This is a routine wellness examination for [place].  Exercise Activities and Dietary recommendations Current Exercise Habits: The patient does not participate in regular exercise at present, Exercise limited by: None identified   Diet (meal preparation, eat out, water intake, caffeinated beverages, dairy products, fruits and vegetables): Drinks water and juices.   Breakfast: bacon, eggs, fruit, coffee Lunch: pot pie Dinner: protein and veggies  Goals    . Patient Stated     Maintain current health.     . Weight (lb) < 160 lb (72.6 kg)     Lose weight by increasing activity and eating healthier.    . Weight (lb) < 170 lb (77.1 kg)     Lose  weight by continuing to stay active.        Fall Risk Fall Risk  08/04/2019 02/05/2019 07/01/2018 06/25/2017 05/08/2016  Falls in the past year? 0 0 No No No  Number falls in past yr: 0 0 - - -  Injury with Fall? 0 0 - - -  Follow up Falls prevention discussed Falls evaluation completed - - -    Depression Screen PHQ 2/9 Scores 08/04/2019 02/05/2019 07/01/2018 03/14/2018  PHQ - 2 Score 0 0 0 0  PHQ- 9 Score - - - 0     Cognitive Function MMSE - Mini Mental State Exam 08/04/2019 07/01/2018  Orientation to time 5 5  Orientation to Place 5 5  Registration 3 3  Attention/ Calculation 5 5  Recall 3 3  Language- name 2 objects 2 2  Language- repeat 1 1  Language- follow 3 step command 3 3  Language- read & follow direction 1 1  Write a sentence 1 1  Copy design 1 1  Total score 30 30        Immunization History  Administered Date(s) Administered  . Pneumococcal Conjugate-13 04/01/2014  . Pneumococcal Polysaccharide-23 11/29/2015  . Td 08/14/2011  . Zoster 01/20/2013    Screening Tests Health Maintenance  Topic Date Due  . INFLUENZA VACCINE  07/25/2019  . DEXA SCAN  07/08/2020  . MAMMOGRAM  07/13/2020  . TETANUS/TDAP  08/13/2021  . COLONOSCOPY  05/29/2026  . Hepatitis C Screening  Completed  . PNA vac Low Risk Adult  Addressed        Plan:     Shingles vaccine at pharmacy.   Bring a copy of your living will and/or healthcare power of attorney to your next office visit.  Continue doing brain stimulating activities (puzzles, reading, adult coloring books, staying active) to keep memory sharp.   I have personally reviewed and  noted the following in the patient's chart:   . Medical and social history . Use of alcohol, tobacco or illicit drugs  . Current medications and supplements . Functional ability and status . Nutritional status . Physical activity . Advanced directives . List of other physicians . Hospitalizations, surgeries, and ER visits in previous 12 months  . Vitals . Screenings to include cognitive, depression, and falls . Referrals and appointments  In addition, I have reviewed and discussed with patient certain preventive protocols, quality metrics, and best practice recommendations. A written personalized care plan for preventive services as well as general preventive health recommendations were provided to patient.     Gerilyn Nestle, RN  08/04/2019  No F/U with PCP at this time. Will call when appt needed.   Medical screening examination/treatment/procedure(s) were performed by non-physician practitioner and as supervising physician I was immediately available for consultation/collaboration.  I agree with above assessment and plan.  Electronically Signed by: Howard Pouch, DO [facility] [place]

## 2019-09-04 ENCOUNTER — Telehealth: Payer: Self-pay | Admitting: Family Medicine

## 2019-09-04 NOTE — Telephone Encounter (Signed)
Patient's phone is not accepting calls. Patient is due for follow up US of thyroid nodule. If patient calls back please advise for her to call [phone] to schedule an appointment.

## 2019-09-08 ENCOUNTER — Encounter: Payer: Self-pay | Admitting: Allergy and Immunology

## 2019-09-08 ENCOUNTER — Ambulatory Visit: Payer: Medicare HMO | Admitting: Allergy and Immunology

## 2019-09-08 ENCOUNTER — Other Ambulatory Visit: Payer: Self-pay

## 2019-09-08 DIAGNOSIS — K219 Gastro-esophageal reflux disease without esophagitis: Secondary | ICD-10-CM | POA: Diagnosis not present

## 2019-09-08 DIAGNOSIS — R430 Anosmia: Secondary | ICD-10-CM | POA: Diagnosis not present

## 2019-09-08 DIAGNOSIS — J310 Chronic rhinitis: Secondary | ICD-10-CM

## 2019-09-08 DIAGNOSIS — J321 Chronic frontal sinusitis: Secondary | ICD-10-CM

## 2019-09-08 MED ORDER — AZELASTINE-FLUTICASONE 137-50 MCG/ACT NA SUSP: 1.0000 | Freq: Two times a day (BID) | NASAL | 5 refills | Status: DC | PRN

## 2019-09-08 NOTE — Progress Notes (Signed)
New Patient Note  RE: Shelly Leonard MRN: [id number] DOB: 09-Mar-1946 Date of Office Visit: 09/08/2019  Referring provider: No ref. provider found Primary care provider: [name], DO  Chief Complaint: Sinus Problem and Nasal Congestion  History of present illness: Shelly Leonard is a 73 y.o. female presenting today for evaluation of rhinosinusitis.  For many years, she has experienced frequent sinus pressure, mainly over her forehead, as well as nasal congestion and thick postnasal drainage.  These symptoms occur year-round but are more frequent and severe during the colder months of the year.  She denies sneezing, rhinorrhea, nasal pruritus, and ocular pruritus.  She has asked to defer skin testing.  She has tried fluticasone nasal spray and azelastine nasal spray separately with some benefit, however has not used either nasal spray on a regular basis. She also sniffed water up her nose on occasion. Over the past 3 years she has experienced anosmia and occasional loss of taste. She has acid reflux which she currently controls with diet and pantoprazole as needed.  Assessment and plan: Chronic sinusitis Environmental skin testing was deferred today.  A prescription has been provided for azelastine/fluticasone nasal spray, 1 spray per nostril twice daily as needed. Proper nasal spray technique has been discussed and demonstrated.  Nasal saline lavage (NeilMed) has been recommended as needed and prior to medicated nasal sprays along with instructions for proper administration.  For thick post nasal drainage, add guaifenesin [phone] mg (Mucinex)  twice daily as needed with adequate hydration as discussed.  If the problem persists or progresses despite treatment plan as outlined above, the patient will return for allergy skin testing to assist with allergen avoidance measures.  Anosmia In the context of chronic sinusitis, this may represent nasal polyposis.  Prednisone has  been provided, 40 mg x3 days, 20 mg x1 day, 10 mg x1 day, then stop.  Fluticasone/azelastine nasal spray has been prescribed (as above).  For now, the patient is to use this nasal spray daily and assess for improvement regarding the anosmia.  If this problem persists or progresses despite treatment plan as outlined above, further evaluation by an otolaryngologist may be warranted.  Gastroesophageal reflux disease  Continue appropriate reflux lifestyle modifications (recommendations have been provided).  Pantoprazole if needed.   Meds ordered this encounter  Medications  . Azelastine-Fluticasone 137-50 MCG/ACT SUSP    Sig: Place 1 spray into the nose 2 (two) times daily as needed.    Dispense:  23 g    Refill:  5    Please dispense generic brand    Diagnostics: Environmental skin testing: The patient deferred testing today.  Physical examination: Blood pressure 130/76, pulse 69, temperature (!) 97.1 F (36.2 C), temperature source Temporal, resp. rate 16, height 5' 2.5" (1.588 m), weight 173 lb 4 oz (78.6 kg), SpO2 98 %.  General: Alert, interactive, in no acute distress. HEENT: TMs pearly gray, turbinates moderately edematous without discharge, post-pharynx moderately erythematous. Neck: Supple without lymphadenopathy. Lungs: Clear to auscultation without wheezing, rhonchi or rales. CV: Normal S1, S2 without murmurs. Abdomen: Nondistended, nontender. Skin: Warm and dry, without lesions or rashes. Extremities:  No clubbing, cyanosis or edema. Neuro:   Grossly intact.  Review of systems:  Review of systems negative except as noted in HPI / PMHx or noted below: Review of Systems  Constitutional: Negative.   HENT: Negative.   Eyes: Negative.   Respiratory: Negative.   Cardiovascular: Negative.   Gastrointestinal: Negative.   Genitourinary: Negative.  Musculoskeletal: Negative.   Skin: Negative.   Neurological: Negative.   Endo/Heme/Allergies: Negative.    Psychiatric/Behavioral: Negative.     Past medical history:  Past Medical History:  Diagnosis Date  . Allergy   . Anemia    low iron (hgb 8)  . Arthritis    knees and hands   . Carpal tunnel syndrome   . Cataract   . Diverticulitis large intestine w/o perforation or abscess w/bleeding   . GERD (gastroesophageal reflux disease)   . Headache(784.0)    occasional   . Hypertension   . Lumbar spondylosis   . Osteopenia   . PONV (postoperative nausea and vomiting)   . Vertigo   . Vitamin D deficiency     Past surgical history:  Past Surgical History:  Procedure Laterality Date  . BREAST REDUCTION SURGERY  1982  . CARPAL TUNNEL RELEASE     left   . COLONOSCOPY    . FOOT SURGERY    . KNEE ARTHROSCOPY     right knee 2012   . OTHER SURGICAL HISTORY     spur removed top of left foot   . REDUCTION MAMMAPLASTY  1982  . TOTAL KNEE ARTHROPLASTY  04/21/2012   Procedure: TOTAL KNEE ARTHROPLASTY;  Surgeon: [name], MD;  Location: [facility];  Service: Orthopedics;  Laterality: Right;  [name] Kitchen VAGINAL HYSTERECTOMY  1977    Family history: Family History  Problem Relation Age of Onset  . Breast cancer Mother        late in life  . Diabetes Mother        late in life  . Heart disease Father   . Lung disease Father   . Colon cancer Neg Hx   . Esophageal cancer Neg Hx   . Stomach cancer Neg Hx   . Rectal cancer Neg Hx     Social history: Social History   Socioeconomic History  . Marital status: Widowed    Spouse name: Not on file  . Number of children: 2  . Years of education: Not on file  . Highest education level: Not on file  Occupational History  . Not on file  Social Needs  . Financial resource strain: Not on file  . Food insecurity    Worry: Not on file    Inability: Not on file  . Transportation needs    Medical: Not on file    Non-medical: Not on file  Tobacco Use  . Smoking status: Never Smoker  . Smokeless tobacco: Never Used  Substance and Sexual  Activity  . Alcohol use: No  . Drug use: No  . Sexual activity: Yes    Birth control/protection: Post-menopausal  Lifestyle  . Physical activity    Days per week: Not on file    Minutes per session: Not on file  . Stress: Not on file  Relationships  . Social Herbalist on phone: Not on file    Gets together: Not on file    Attends religious service: Not on file    Active member of club or organization: Not on file    Attends meetings of clubs or organizations: Not on file    Relationship status: Not on file  . Intimate partner violence    Fear of current or ex partner: Not on file    Emotionally abused: Not on file    Physically abused: Not on file    Forced sexual activity: Not on file  Other Topics  Concern  . Not on file  Social History Narrative   Ms Elijah is widowed (12/2016).    She enjoys music and is involved with her church.   Environmental History: The patient lives in a house with hardwood floors throughout, window air conditions and electric heat.  There is no known mold/water damage in the home.  There are no pets in the home.  She is a non-smoker.  Allergies as of 09/08/2019   No Known Allergies     Medication List       Accurate as of September 08, 2019  4:56 PM. If you have any questions, ask your nurse or doctor.        amoxicillin-clavulanate 875-125 MG tablet Commonly known as: AUGMENTIN Take 1 tablet by mouth 2 (two) times daily.   aspirin EC 81 MG tablet Take 81 mg by mouth daily.   azelastine 0.1 % nasal spray Commonly known as: ASTELIN Place 2 sprays into both nostrils 2 (two) times daily. Use in each nostril as directed   Azelastine-Fluticasone 137-50 MCG/ACT Susp Place 1 spray into the nose 2 (two) times daily as needed. Started by: Edmonia Lynch, MD   ciclopirox 0.77 % cream Commonly known as: LOPROX APPLY  CREAM TOPICALLY TO AFFECTED AREA TWICE DAILY   fluticasone 50 MCG/ACT nasal spray Commonly known as: FLONASE  Place 2 sprays into both nostrils daily. What changed:   when to take this  reasons to take this   Garlic 123XX123 MG Tabs Take 100 mg by mouth daily with breakfast.   HYDROcodone-acetaminophen 5-325 MG tablet Commonly known as: NORCO/VICODIN 1-2 tabs po q6h prn pain   loratadine 10 MG tablet Commonly known as: CLARITIN Take 10 mg by mouth daily.   multivitamin with minerals Tabs tablet Take 1 tablet by mouth daily with breakfast.   OMEGA 3 PO Take 1 capsule by mouth daily.   pantoprazole 20 MG tablet Commonly known as: PROTONIX Take 1 tablet (20 mg total) by mouth daily as needed.   PROBIOTIC DAILY PO Take by mouth.   tizanidine 2 MG capsule Commonly known as: Zanaflex Take 1 capsule (2 mg total) by mouth 2 (two) times daily as needed for muscle spasms.   triamterene-hydrochlorothiazide 75-50 MG tablet Commonly known as: MAXZIDE TAKE 1 TABLET BY MOUTH IN THE MORNING WITH BREAKFAST   verapamil 120 MG tablet Commonly known as: CALAN Take 1 tablet (120 mg total) by mouth daily.   VITAMIN D PO Take by mouth.       Known medication allergies: No Known Allergies  I appreciate the opportunity to take part in [place] care. Please do not hesitate to contact me with questions.  Sincerely,   R. Edgar Frisk, MD

## 2019-09-08 NOTE — Assessment & Plan Note (Signed)
Environmental skin testing was deferred today.  A prescription has been provided for azelastine/fluticasone nasal spray, 1 spray per nostril twice daily as needed. Proper nasal spray technique has been discussed and demonstrated.  Nasal saline lavage (NeilMed) has been recommended as needed and prior to medicated nasal sprays along with instructions for proper administration.  For thick post nasal drainage, add guaifenesin [phone] mg (Mucinex)  twice daily as needed with adequate hydration as discussed.  If the problem persists or progresses despite treatment plan as outlined above, the patient will return for allergy skin testing to assist with allergen avoidance measures.

## 2019-09-08 NOTE — Patient Instructions (Addendum)
Chronic sinusitis Environmental skin testing was deferred today.  A prescription has been provided for azelastine/fluticasone nasal spray, 1 spray per nostril twice daily as needed. Proper nasal spray technique has been discussed and demonstrated.  Nasal saline lavage (NeilMed) has been recommended as needed and prior to medicated nasal sprays along with instructions for proper administration.  For thick post nasal drainage, add guaifenesin [phone] mg (Mucinex)  twice daily as needed with adequate hydration as discussed.  If the problem persists or progresses despite treatment plan as outlined above, the patient will return for allergy skin testing to assist with allergen avoidance measures.  Anosmia In the context of chronic sinusitis, this may represent nasal polyposis.  Prednisone has been provided, 40 mg x3 days, 20 mg x1 day, 10 mg x1 day, then stop.  Fluticasone/azelastine nasal spray has been prescribed (as above).  For now, the patient is to use this nasal spray daily and assess for improvement regarding the anosmia.  If this problem persists or progresses despite treatment plan as outlined above, further evaluation by an otolaryngologist may be warranted.  Gastroesophageal reflux disease  Continue appropriate reflux lifestyle modifications (recommendations have been provided).  Pantoprazole if needed.   Return in about 3 months (around 12/08/2019), or if symptoms worsen or fail to improve.  Lifestyle Changes for Controlling GERD  When you have GERD, stomach acid feels as if it's backing up toward your mouth. Whether or not you take medication to control your GERD, your symptoms can often be improved with lifestyle changes.   Raise Your Head  Reflux is more likely to strike when you're lying down flat, because stomach fluid can  flow backward more easily. Raising the head of your bed 4-6 inches can help. To do this:  Slide blocks or books under the legs at the head  of your bed. Or, place a wedge under  the mattress. Many foam stores can make a suitable wedge for you. The wedge  should run from your waist to the top of your head.  Don't just prop your head on several pillows. This increases pressure on your  stomach. It can make GERD worse.  Watch Your Eating Habits Certain foods may increase the acid in your stomach or relax the lower esophageal sphincter, making GERD more likely. It's best to avoid the following:  Coffee, tea, and carbonated drinks (with and without caffeine)  Fatty, fried, or spicy food  Mint, chocolate, onions, and tomatoes  Any other foods that seem to irritate your stomach or cause you pain  Relieve the Pressure  Eat smaller meals, even if you have to eat more often.  Don't lie down right after you eat. Wait a few hours for your stomach to empty.  Avoid tight belts and tight-fitting clothes.  Lose excess weight.  Tobacco and Alcohol  Avoid smoking tobacco and drinking alcohol. They can make GERD symptoms worse.

## 2019-09-08 NOTE — Telephone Encounter (Signed)
Patient has been scheduled 09/21/19

## 2019-09-08 NOTE — Assessment & Plan Note (Signed)
   Continue appropriate reflux lifestyle modifications (recommendations have been provided).  Pantoprazole if needed.

## 2019-09-08 NOTE — Assessment & Plan Note (Signed)
In the context of chronic sinusitis, this may represent nasal polyposis.  Prednisone has been provided, 40 mg x3 days, 20 mg x1 day, 10 mg x1 day, then stop.  Fluticasone/azelastine nasal spray has been prescribed (as above).  For now, the patient is to use this nasal spray daily and assess for improvement regarding the anosmia.  If this problem persists or progresses despite treatment plan as outlined above, further evaluation by an otolaryngologist may be warranted.

## 2019-09-09 ENCOUNTER — Telehealth: Payer: Self-pay

## 2019-09-09 DIAGNOSIS — R430 Anosmia: Secondary | ICD-10-CM

## 2019-09-09 NOTE — Telephone Encounter (Signed)
Sounds good. May send in separate scripts. Thanks.

## 2019-09-09 NOTE — Telephone Encounter (Signed)
Patient called stating generic Dymista is not covered under insurance we need to send a script for them separately azelastine and fluticasone.

## 2019-09-10 ENCOUNTER — Telehealth: Payer: Self-pay

## 2019-09-10 MED ORDER — AZELASTINE HCL 0.1 % NA SOLN: 1.0000 | Freq: Two times a day (BID) | NASAL | 5 refills | Status: DC

## 2019-09-10 MED ORDER — FLUTICASONE PROPIONATE 50 MCG/ACT NA SUSP: 2.0000 | Freq: Every day | NASAL | 5 refills | Status: DC

## 2019-09-10 NOTE — Telephone Encounter (Signed)
Patient stated that the Generic for the Dymista is $50 and she is not able to pay that every time she needs it. Pharmacy informed her that the Flonase would only cost her $10. She is wondering if she should get that one instead due to cost or should she get the Generic Dymista. Please advise.

## 2019-09-10 NOTE — Telephone Encounter (Signed)
Azelastine + fluticasone both sent to pharmacy.

## 2019-09-10 NOTE — Telephone Encounter (Signed)
Patient came up to the [facility] and ask about her prescription. I informed her of Dr. Mariane Masters recommendation and she stated she will ask them about the prices and if it doesn't cost too much she will get that one as well and let us know.

## 2019-09-10 NOTE — Telephone Encounter (Signed)
Yes, that's fine. If generic azelastine is not too expensive, we can add that one on as well. Thanks.

## 2019-09-10 NOTE — Telephone Encounter (Signed)
Patient called this morning to ask if any medications had been sent in. I looked in her chart and told her the azelastine and Flonase had been sent in to the [facility] [address]. I told her to call before going to make sure they were ready.

## 2019-09-10 NOTE — Addendum Note (Signed)
Addended by: Katherina Right D on: 09/10/2019 10:00 AM   Modules accepted: Orders

## 2019-09-17 ENCOUNTER — Ambulatory Visit (HOSPITAL_BASED_OUTPATIENT_CLINIC_OR_DEPARTMENT_OTHER): Payer: Medicare HMO

## 2019-10-12 ENCOUNTER — Other Ambulatory Visit: Payer: Self-pay | Admitting: Family Medicine

## 2019-10-12 DIAGNOSIS — I10 Essential (primary) hypertension: Secondary | ICD-10-CM

## 2019-10-12 NOTE — Telephone Encounter (Signed)
Pt was called and VM was left letting patient know she needed to return call and schedule 6 month [facility] appt for further refills

## 2019-10-12 NOTE — Telephone Encounter (Signed)
Please call patient. I have received refill request and pt is due for 6 mos follow up for her [place]. Please schedule ASAP. I did cal in mediation, however pt needs schedule ASAp- last [facility] appt was 01/2019

## 2019-11-03 ENCOUNTER — Other Ambulatory Visit: Payer: Self-pay

## 2019-11-03 ENCOUNTER — Encounter: Payer: Self-pay | Admitting: Family Medicine

## 2019-11-03 ENCOUNTER — Ambulatory Visit (INDEPENDENT_AMBULATORY_CARE_PROVIDER_SITE_OTHER): Payer: Medicare HMO | Admitting: Family Medicine

## 2019-11-03 VITALS — BP 116/84 | HR 63 | Temp 98.0°F | Resp 18 | Ht 63.0 in

## 2019-11-03 DIAGNOSIS — E559 Vitamin D deficiency, unspecified: Secondary | ICD-10-CM

## 2019-11-03 DIAGNOSIS — E041 Nontoxic single thyroid nodule: Secondary | ICD-10-CM | POA: Diagnosis not present

## 2019-11-03 DIAGNOSIS — I10 Essential (primary) hypertension: Secondary | ICD-10-CM

## 2019-11-03 DIAGNOSIS — K219 Gastro-esophageal reflux disease without esophagitis: Secondary | ICD-10-CM | POA: Diagnosis not present

## 2019-11-03 MED ORDER — PANTOPRAZOLE SODIUM 20 MG PO TBEC: 20.0000 mg | DELAYED_RELEASE_TABLET | Freq: Every day | ORAL | 1 refills | Status: DC | PRN

## 2019-11-03 MED ORDER — TRIAMTERENE-HCTZ 75-50 MG PO TABS: 1.0000 | ORAL_TABLET | Freq: Every day | ORAL | 1 refills | Status: DC

## 2019-11-03 MED ORDER — VERAPAMIL HCL 120 MG PO TABS: 120.0000 mg | ORAL_TABLET | Freq: Every day | ORAL | 1 refills | Status: DC

## 2019-11-03 NOTE — Progress Notes (Signed)
Shelly Leonard , 29-Apr-1946, 73 y.o., female MRN: [id number] Patient Care Team    Relationship Specialty Notifications Start End  [name], DO PCP - General Family Medicine  08/18/15   Warden Fillers, MD Consulting Physician Ophthalmology  05/08/16   Milus Banister, MD Attending Physician Gastroenterology  05/08/16   Thornell Sartorius, MD Consulting Physician Otolaryngology  06/25/17   Manson Passey, [facility]  Specialist  07/01/18     Chief Complaint  Patient presents with  . Hypertension    Pt does not check BP at home. Plans to get one this weekend.      Subjective:  Shelly Leonard is a 73 y.o. female. Pt presents today for follow up on chronic medical conditions.    Essential hypertension, benign/morbid obesity Pt reports compliance with Maxzide 75-50 and verapamil 120 mg daily. Blood pressures ranges at home not routinely checked. Patient denies chest pain, shortness of breath, dizziness or lower extremity edema.  She has been walking and lost weight. Pt takes a daily baby ASA. Pt is not prescribed statin. BMP: 03/24/2018 within normal limits with the exception of mildly low sodium and increased calcium CBC: 03/24/2018 within normal limits Lipid panel: 03/24/2018 total cholesterol 179, HDL 61, LDL 106, triglycerides 61. TSH: 05/09/2018 within normal limits Diet: Low sodium. Exercise: Routine exercise. RF: Hypertension, obesity, family history of heart disease  Gastroesophageal reflux disease, esophagitis presence not specified Doing well on Protonix 20 mg daily.  She is in need of refills today.  Hypercalcemia/Thyroid nodule She does have multiple nodules within the thyroid.  Her biopsy was negative for any type of malignancy.  SHe has started to increase her water consumption daily.  Repeat ultrasound due June 2020.   Depression screen [facility] 2/9 08/04/2019 02/05/2019 07/01/2018 03/14/2018 09/27/2017  Decreased Interest 0 0 0 0 0  Down, Depressed, Hopeless 0 0 0 0 3  PHQ - 2 Score  0 0 0 0 3  Altered sleeping - - - 0 2  Tired, decreased energy - - - 0 2  Change in appetite - - - 0 1  Feeling bad or failure about yourself  - - - 0 0  Trouble concentrating - - - 0 1  Moving slowly or fidgety/restless - - - 0 0  Suicidal thoughts - - - 0 0  PHQ-9 Score - - - 0 9  Difficult doing work/chores - - - - Somewhat difficult    No Known Allergies Social History   Tobacco Use  . Smoking status: Never Smoker  . Smokeless tobacco: Never Used  Substance Use Topics  . Alcohol use: No   Past Medical History:  Diagnosis Date  . Allergy   . Anemia    low iron (hgb 8)  . Arthritis    knees and hands   . Carpal tunnel syndrome   . Cataract   . Diverticulitis large intestine w/o perforation or abscess w/bleeding   . GERD (gastroesophageal reflux disease)   . Headache(784.0)    occasional   . Hypertension   . Lumbar spondylosis   . Osteopenia   . PONV (postoperative nausea and vomiting)   . Vertigo   . Vitamin D deficiency    Past Surgical History:  Procedure Laterality Date  . BREAST REDUCTION SURGERY  1982  . CARPAL TUNNEL RELEASE     left   . COLONOSCOPY    . FOOT SURGERY    . KNEE ARTHROSCOPY     right knee 2012   .  OTHER SURGICAL HISTORY     spur removed top of left foot   . REDUCTION MAMMAPLASTY  1982  . TOTAL KNEE ARTHROPLASTY  04/21/2012   Procedure: TOTAL KNEE ARTHROPLASTY;  Surgeon: [name], MD;  Location: [facility];  Service: Orthopedics;  Laterality: Right;  [name] Kitchen VAGINAL HYSTERECTOMY  1977   Family History  Problem Relation Age of Onset  . Breast cancer Mother        late in life  . Diabetes Mother        late in life  . Heart disease Father   . Lung disease Father   . Colon cancer Neg Hx   . Esophageal cancer Neg Hx   . Stomach cancer Neg Hx   . Rectal cancer Neg Hx    Allergies as of 11/03/2019   No Known Allergies     Medication List       Accurate as of November 03, 2019 11:59 PM. If you have any questions, ask your nurse or  doctor.        STOP taking these medications   amoxicillin-clavulanate 875-125 MG tablet Commonly known as: AUGMENTIN Stopped by: Howard Pouch, DO   HYDROcodone-acetaminophen 5-325 MG tablet Commonly known as: NORCO/VICODIN Stopped by: Howard Pouch, DO     TAKE these medications   aspirin EC 81 MG tablet Take 81 mg by mouth daily.   azelastine 0.1 % nasal spray Commonly known as: ASTELIN Place 1-2 sprays into both nostrils 2 (two) times daily.   Azelastine-Fluticasone 137-50 MCG/ACT Susp Place 1 spray into the nose 2 (two) times daily as needed.   ciclopirox 0.77 % cream Commonly known as: LOPROX APPLY  CREAM TOPICALLY TO AFFECTED AREA TWICE DAILY   fluticasone 50 MCG/ACT nasal spray Commonly known as: FLONASE Place 2 sprays into both nostrils daily.   Garlic 794 MG Tabs Take 100 mg by mouth daily with breakfast.   loratadine 10 MG tablet Commonly known as: CLARITIN Take 10 mg by mouth daily.   multivitamin with minerals Tabs tablet Take 1 tablet by mouth daily with breakfast.   OMEGA 3 PO Take 1 capsule by mouth daily.   pantoprazole 20 MG tablet Commonly known as: PROTONIX Take 1 tablet (20 mg total) by mouth daily as needed.   PROBIOTIC DAILY PO Take by mouth.   tizanidine 2 MG capsule Commonly known as: Zanaflex Take 1 capsule (2 mg total) by mouth 2 (two) times daily as needed for muscle spasms.   triamterene-hydrochlorothiazide 75-50 MG tablet Commonly known as: MAXZIDE Take 1 tablet by mouth daily. What changed: See the new instructions. Changed by: Howard Pouch, DO   verapamil 120 MG tablet Commonly known as: CALAN Take 1 tablet (120 mg total) by mouth daily.   VITAMIN D PO Take by mouth.       All past medical history, surgical history, allergies, family history, immunizations andmedications were updated in the EMR today and reviewed under the history and medication portions of their EMR.     ROS: Negative, with the exception of  above mentioned in HPI   Objective:  BP 116/84 (BP Location: Left Arm, Patient Position: Sitting, Cuff Size: Normal)   Pulse 63   Temp 98 F (36.7 C) (Temporal)   Resp 18   Ht _0  (1.6 m)   SpO2 94%   BMI 30.69 kg/m  Body mass index is 30.69 kg/m. Gen: Afebrile. No acute distress.  Very pleasant African-American female.  Obese. HENT: AT. [place].  Eyes:Pupils  Equal Round Reactive to light, Extraocular movements intact,  Conjunctiva without redness, discharge or icterus. Neck/lymp/endocrine: Supple, no lymphadenopathy, no thyromegaly CV: RRR no murmur, no edema, +2/4 P posterior tibialis pulses Chest: CTAB, no wheeze or crackles Abd: Soft. NTND. BS present.  No masses palpated.  Skin: No rashes, purpura or petechiae.  Neuro: Normal gait. PERLA. EOMi. Alert. Oriented x3 Psych: Normal affect, dress and demeanor. Normal speech. Normal thought content and judgment.   No exam data present No results found. No results found for this or any previous visit (from the past 24 hour(s)).  Assessment/Plan: Shelly Leonard is a 73 y.o. female present for OV for  Essential hypertension, benign Stable.  Continue/refilled Maxide and verapamil today.  -Low-sodium diet, exercise encouraged. -CBC, CMP and lipid panel collected today - triamterene-hydrochlorothiazide (MAXZIDE) 75-50 MG tablet; TAKE 1 TABLET BY MOUTH IN THE MORNING WITH BREAKFAST  Dispense: 90 tablet; Refill: 1 - verapamil (CALAN) 120 MG tablet; Take 1 tablet (120 mg total) by mouth daily.  Dispense: 90 tablet; Refill: 1 -Follow-up 6 months.  Gastroesophageal reflux disease, esophagitis presence not specified Stable.  Continue/refilled  Protonix. - pantoprazole (PROTONIX) 20 MG tablet; Take 1 tablet (20 mg total) by mouth daily as needed.  Dispense: 90 tablet; Refill: 1  Hypercalcemia/Thyroid nodule - CMP, ACE, PTH/CA, TSH ordered and US thyroid rpt for June 2020 -Biopsy was benign.   -PTH was normal.  Vitamin D 30. -Continue  to work on adequate hydration. -Follow up 6 months    Reviewed expectations re: course of current medical issues.  Discussed self-management of symptoms.  Outlined signs and symptoms indicating need for more acute intervention.  Patient verbalized understanding and all questions were answered.  Patient received an After-Visit Summary.    Orders Placed This Encounter  Procedures  . US Soft Tissue Head/Neck  . Angiotensin converting enzyme  . CBC  . Comp Met (CMET)  . Lipid panel  . PTH, Intact and Calcium  . TSH  . Vitamin D (25 hydroxy)     Note is dictated utilizing voice recognition software. Although note has been proof read prior to signing, occasional typographical errors still can be missed. If any questions arise, please do not hesitate to call for verification.   electronically signed by:  Howard Pouch, DO  [place]

## 2019-11-03 NOTE — Patient Instructions (Signed)
Great to see you today! You look great.   I have refilled your meds.  We will call you with labs once available.   They will call you to get your thyroid ultrasound at [facility].    Follow up in 6 months unless needed sooner. They will call you to schedule your appt.

## 2019-11-04 ENCOUNTER — Ambulatory Visit (INDEPENDENT_AMBULATORY_CARE_PROVIDER_SITE_OTHER): Payer: Medicare HMO | Admitting: Family Medicine

## 2019-11-04 DIAGNOSIS — E041 Nontoxic single thyroid nodule: Secondary | ICD-10-CM | POA: Diagnosis not present

## 2019-11-04 DIAGNOSIS — I10 Essential (primary) hypertension: Secondary | ICD-10-CM

## 2019-11-04 DIAGNOSIS — E559 Vitamin D deficiency, unspecified: Secondary | ICD-10-CM | POA: Diagnosis not present

## 2019-11-04 LAB — COMPREHENSIVE METABOLIC PANEL
ALT: 17 U/L (ref 0–35)
AST: 17 U/L (ref 0–37)
Albumin: 4.1 g/dL (ref 3.5–5.2)
Alkaline Phosphatase: 43 U/L (ref 39–117)
BUN: 13 mg/dL (ref 6–23)
CO2: 30 mEq/L (ref 19–32)
Calcium: 10.2 mg/dL (ref 8.4–10.5)
Chloride: 97 mEq/L (ref 96–112)
Creatinine, Ser: 0.65 mg/dL (ref 0.40–1.20)
GFR: 108.4 mL/min (ref >60.00)
Glucose, Bld: 90 mg/dL (ref 70–99)
Potassium: 3.9 mEq/L (ref 3.5–5.1)
Sodium: 132 mEq/L — ABNORMAL LOW (ref 135–145)
Total Bilirubin: 0.5 mg/dL (ref 0.2–1.2)
Total Protein: 7.2 g/dL (ref 6.0–8.3)

## 2019-11-04 LAB — TSH: TSH: 0.54 u[IU]/mL (ref 0.35–4.50)

## 2019-11-04 LAB — CBC
HCT: 38.1 % (ref 36.0–46.0)
Hemoglobin: 12.5 g/dL (ref 12.0–15.0)
MCHC: 32.8 g/dL (ref 30.0–36.0)
MCV: 88.4 fl (ref 78.0–100.0)
Platelets: 287 10*3/uL (ref 150.0–400.0)
RBC: 4.31 Mil/uL (ref 3.87–5.11)
RDW: 14.3 % (ref 11.5–15.5)
WBC: 5.9 10*3/uL (ref 4.0–10.5)

## 2019-11-04 LAB — VITAMIN D 25 HYDROXY (VIT D DEFICIENCY, FRACTURES): VITD: 31.23 ng/mL (ref 30.00–100.00)

## 2019-11-04 LAB — LIPID PANEL
Cholesterol: 164 mg/dL (ref 0–200)
HDL: 53 mg/dL (ref >39.00)
LDL Cholesterol: 84 mg/dL (ref 0–99)
NonHDL: 111.22
Total CHOL/HDL Ratio: 3
Triglycerides: 138 mg/dL (ref 0.0–149.0)
VLDL: 27.6 mg/dL (ref 0.0–40.0)

## 2019-11-05 ENCOUNTER — Telehealth: Payer: Self-pay | Admitting: Family Medicine

## 2019-11-05 NOTE — Telephone Encounter (Signed)
Pt was called and given information/instructions. She verbalized understanding and will call back to schedule

## 2019-11-05 NOTE — Telephone Encounter (Signed)
Please inform patient the following information: Her blood counts are normal, her cholesterol panel looks great, her calcium is in normal range (1st time in a while), her liver, kidney and thyroid functions are normal.   She has very mildly low sodium.  Again this is just mild, she is drinking a good amount of water with her hypercalcemia and I would encourage her to continue to do that, but also add a low sugar Gatorade (G2) and/or a V8 juice if she can tolerate both are good sources of electrolytes.  She has another lab pending that is monitored secondary to her hypercalcemia, and we will call her when we get those results back, they can take quite a few days.  Follow-up in 5 and half months, please make this appointment for her

## 2019-11-06 LAB — PTH, INTACT AND CALCIUM
Calcium: 10.7 mg/dL — ABNORMAL HIGH (ref 8.6–10.4)
PTH: 51 pg/mL (ref 14–64)

## 2019-11-06 LAB — ANGIOTENSIN CONVERTING ENZYME: Angiotensin-Converting Enzyme: <1 U/L — ABNORMAL LOW (ref 9–67)

## 2019-11-09 ENCOUNTER — Encounter: Payer: Self-pay | Admitting: Family Medicine

## 2019-11-16 ENCOUNTER — Telehealth: Payer: Self-pay | Admitting: Family Medicine

## 2019-11-16 NOTE — Telephone Encounter (Signed)
Pt was called to set up an appt for back pain, no answer, LM for patient to return call and to make appt for acute issue.

## 2019-11-16 NOTE — Telephone Encounter (Signed)
Patient walked in asking if Dr. Raoul Pitch could send in an Rx for "the medication that you take that tapers down". Patient states it has helped her back in the past & her back is hurting a lot right now. Please call patient on her home number if you have any questions.

## 2019-11-17 ENCOUNTER — Other Ambulatory Visit: Payer: Self-pay

## 2019-11-17 ENCOUNTER — Encounter: Payer: Self-pay | Admitting: Family Medicine

## 2019-11-17 ENCOUNTER — Ambulatory Visit (INDEPENDENT_AMBULATORY_CARE_PROVIDER_SITE_OTHER): Payer: Medicare HMO | Admitting: Family Medicine

## 2019-11-17 VITALS — Ht 63.0 in

## 2019-11-17 DIAGNOSIS — M545 Low back pain, unspecified: Secondary | ICD-10-CM

## 2019-11-17 MED ORDER — PREDNISONE 20 MG PO TABS: ORAL_TABLET | ORAL | 0 refills | Status: DC

## 2019-11-17 MED ORDER — TIZANIDINE HCL 2 MG PO CAPS: 2.0000 mg | ORAL_CAPSULE | Freq: Two times a day (BID) | ORAL | 5 refills | Status: DC | PRN

## 2019-11-17 NOTE — Progress Notes (Signed)
Telephone visit. Pt does not have Internet or smart phone.   I connected with Shelly Leonard on 11/17/19 at 10:30 AM EST by telepone application and verified that I am speaking with the correct person using two identifiers. Location patient: Home Location provider: [facility], Office Persons participating in the virtual visit: Patient, Dr. Raoul Pitch and R.Baker, LPN  I discussed the limitations of evaluation and management by telemedicine and the availability of in person appointments. The patient expressed understanding and agreed to proceed.   Shelly Leonard , 10/13/1946, 73 y.o., female MRN: [id number] Patient Care Team    Relationship Specialty Notifications Start End  [name], DO PCP - General Family Medicine  08/18/15   Warden Fillers, MD Consulting Physician Ophthalmology  05/08/16   Milus Banister, MD Attending Physician Gastroenterology  05/08/16   Thornell Sartorius, MD Consulting Physician Otolaryngology  06/25/17   Manson Passey, Emerge  Specialist  07/01/18     Chief Complaint  Patient presents with  . Back Pain    Lower back pain x2 weeks. Started on left side and went to right side and now is all over lower back.      Subjective: Pt presents for an OV with complaints of back pain of 2 weeks  duration.  Associated symptoms include low back pain  that has radiated across lumbar spine. No radiation to buttocks or legs. No bowel or bladder dysfunction. No fever, chills or abd pain. Pain is worsening with flexion/bending. No known injury or change in activity.   Pt has tried heating pad and old hydrocodone to ease their symptoms.   Depression screen [facility] 2/9 08/04/2019 02/05/2019 07/01/2018 03/14/2018 09/27/2017  Decreased Interest 0 0 0 0 0  Down, Depressed, Hopeless 0 0 0 0 3  PHQ - 2 Score 0 0 0 0 3  Altered sleeping - - - 0 2  Tired, decreased energy - - - 0 2  Change in appetite - - - 0 1  Feeling bad or failure about yourself  - - - 0 0  Trouble  concentrating - - - 0 1  Moving slowly or fidgety/restless - - - 0 0  Suicidal thoughts - - - 0 0  PHQ-9 Score - - - 0 9  Difficult doing work/chores - - - - Somewhat difficult    No Known Allergies Social History   Social History Narrative   Shelly Leonard is widowed (12/2016).    She enjoys music and is involved with her church.   Past Medical History:  Diagnosis Date  . Allergy   . Anemia    low iron (hgb 8)  . Arthritis    knees and hands   . Carpal tunnel syndrome   . Cataract   . Diverticulitis large intestine w/o perforation or abscess w/bleeding   . GERD (gastroesophageal reflux disease)   . Headache(784.0)    occasional   . Hypertension   . Lumbar spondylosis   . Osteopenia   . PONV (postoperative nausea and vomiting)   . Vertigo   . Vitamin D deficiency    Past Surgical History:  Procedure Laterality Date  . BREAST REDUCTION SURGERY  1982  . CARPAL TUNNEL RELEASE     left   . COLONOSCOPY    . FOOT SURGERY    . KNEE ARTHROSCOPY     right knee 2012   . OTHER SURGICAL HISTORY     spur removed top of left foot   .  REDUCTION MAMMAPLASTY  1982  . TOTAL KNEE ARTHROPLASTY  04/21/2012   Procedure: TOTAL KNEE ARTHROPLASTY;  Surgeon: [name], MD;  Location: [facility];  Service: Orthopedics;  Laterality: Right;  [name] Kitchen VAGINAL HYSTERECTOMY  1977   Family History  Problem Relation Age of Onset  . Breast cancer Mother        late in life  . Diabetes Mother        late in life  . Heart disease Father   . Lung disease Father   . Colon cancer Neg Hx   . Esophageal cancer Neg Hx   . Stomach cancer Neg Hx   . Rectal cancer Neg Hx    Allergies as of 11/17/2019   No Known Allergies     Medication List       Accurate as of November 17, 2019 10:27 AM. If you have any questions, ask your nurse or doctor.        aspirin EC 81 MG tablet Take 81 mg by mouth daily.   azelastine 0.1 % nasal spray Commonly known as: ASTELIN Place 1-2 sprays into both nostrils 2  (two) times daily.   Azelastine-Fluticasone 137-50 MCG/ACT Susp Place 1 spray into the nose 2 (two) times daily as needed.   ciclopirox 0.77 % cream Commonly known as: LOPROX APPLY  CREAM TOPICALLY TO AFFECTED AREA TWICE DAILY   fluticasone 50 MCG/ACT nasal spray Commonly known as: FLONASE Place 2 sprays into both nostrils daily.   Garlic 123XX123 MG Tabs Take 100 mg by mouth daily with breakfast.   loratadine 10 MG tablet Commonly known as: CLARITIN Take 10 mg by mouth daily.   multivitamin with minerals Tabs tablet Take 1 tablet by mouth daily with breakfast.   OMEGA 3 PO Take 1 capsule by mouth daily.   pantoprazole 20 MG tablet Commonly known as: PROTONIX Take 1 tablet (20 mg total) by mouth daily as needed.   PROBIOTIC DAILY PO Take by mouth.   tizanidine 2 MG capsule Commonly known as: Zanaflex Take 1 capsule (2 mg total) by mouth 2 (two) times daily as needed for muscle spasms.   triamterene-hydrochlorothiazide 75-50 MG tablet Commonly known as: MAXZIDE Take 1 tablet by mouth daily.   verapamil 120 MG tablet Commonly known as: CALAN Take 1 tablet (120 mg total) by mouth daily.   VITAMIN D PO Take by mouth.       All past medical history, surgical history, allergies, family history, immunizations andmedications were updated in the EMR today and reviewed under the history and medication portions of their EMR.     ROS: Negative, with the exception of above mentioned in HPI   Objective:  Ht 5\' 3"  (1.6 m)   BMI 30.69 kg/m  Body mass index is 30.69 kg/m. Gen: pleasant.alert. female.  Chest: no cough or shortness of breath Neuro:  Alert. Oriented x3 Psych: Normal affect, dress and demeanor. Normal speech. Normal thought content and judgment.  No exam data present No results found. No results found for this or any previous visit (from the past 24 hour(s)).  Assessment/Plan: Shelly Leonard is a 73 y.o. female present for OV for  Acute bilateral low  back pain without sciatica - rest. Continue heating pad use.  - prednisone taper prescribed- has worked well for her in the past.  - refills provided on zanaflex - start light stretches after 1 week.  - f/u PRN   Reviewed expectations re: course of current medical issues.  Discussed  self-management of symptoms.  Outlined signs and symptoms indicating need for more acute intervention.  Patient verbalized understanding and all questions were answered.  Patient received an After-Visit Summary.    No orders of the defined types were placed in this encounter.  13 minutes spent with pt today  Note is dictated utilizing voice recognition software. Although note has been proof read prior to signing, occasional typographical errors still can be missed. If any questions arise, please do not hesitate to call for verification.   electronically signed by:  Howard Pouch, DO  [place]

## 2019-12-29 ENCOUNTER — Ambulatory Visit
Admission: RE | Admit: 2019-12-29 | Discharge: 2019-12-29 | Disposition: A | Discharge status: Home / Self Care | Payer: Medicare HMO | Source: Ambulatory Visit | Attending: Family Medicine | Admitting: Family Medicine

## 2019-12-29 ENCOUNTER — Telehealth: Payer: Self-pay | Admitting: Family Medicine

## 2019-12-29 DIAGNOSIS — E041 Nontoxic single thyroid nodule: Secondary | ICD-10-CM

## 2019-12-29 DIAGNOSIS — E042 Nontoxic multinodular goiter: Secondary | ICD-10-CM | POA: Diagnosis not present

## 2019-12-29 NOTE — Telephone Encounter (Signed)
Please inform patient the following information: Thyroid ultrasound again showed multiple thyroid nodules that need to be followed routinely and one nodule has become more enlarged since last ultrasound and needs to be further evaluated with biopsy.  I am referring her to endocrinology for follow-ups on her multiple nodules.  She was referred in May 2019, I do not see where she established with them.  It is important for her to get established with endocrinology so they can continue to follow her routinely for her thyroid nodules and hypercalcemia.  They will arrange for her biopsy so that they may follow results.

## 2019-12-30 NOTE — Telephone Encounter (Signed)
Pt was called and phone kept getting D/C. Will try patient again later.

## 2019-12-30 NOTE — Telephone Encounter (Signed)
Pt was called and given results, she verbalized understanding.

## 2019-12-31 ENCOUNTER — Telehealth: Payer: Self-pay | Admitting: Family Medicine

## 2019-12-31 NOTE — Telephone Encounter (Signed)
Patient walked into office requesting literature or reading material regarding "thryoid" I told patient Dr. Raoul Pitch clinical team would call her tomorrow regarding her request. Dr. Raoul Pitch had already left the day.  Patient was in total agreement to contact her tomorrow.

## 2019-12-31 NOTE — Telephone Encounter (Signed)
I commended her for her desire for further education on her condition.  However what she is asking for, which is "information on thyroid nodules, general thyroid information and hyperglycemia" would be extremely vast in scope.  There are entire books written on each of those subjects. That is why I referred her to a specialist called an endocrinologist which can guide her through the process of her thyroid nodule, biopsy and follow-up and provide her with information that directly correlates to her specifically-which would be based upon the results of the thyroid nodule biopsy.  Briefly, many people have thyroid nodules and it does not necessarily mean anything alarming.  However if thyroid nodules become above a certain size, or meet a certain criteria, then they need to be biopsied because they can be cancerous in nature.  Hypercalcemia can be caused for many reasons that are endocrine related versus thyroid cancer related versus parathyroid hormone related. Her thyroid nodule meets criteria for biopsy and she has multiple thyroid nodules that meet criteria for routine follow-up by an endocrinologist.

## 2019-12-31 NOTE — Telephone Encounter (Signed)
Pt would like information on thyroid nodules, general thyroid information, and hypercalcemia

## 2020-01-01 NOTE — Telephone Encounter (Signed)
Patient advised of provider recommendations and transferred to Diane to get endo appointment scheduled.

## 2020-01-21 DIAGNOSIS — K219 Gastro-esophageal reflux disease without esophagitis: Secondary | ICD-10-CM | POA: Diagnosis not present

## 2020-01-21 DIAGNOSIS — Z8639 Personal history of other endocrine, nutritional and metabolic disease: Secondary | ICD-10-CM | POA: Diagnosis not present

## 2020-01-21 DIAGNOSIS — E042 Nontoxic multinodular goiter: Secondary | ICD-10-CM | POA: Diagnosis not present

## 2020-01-21 DIAGNOSIS — I10 Essential (primary) hypertension: Secondary | ICD-10-CM | POA: Diagnosis not present

## 2020-01-22 ENCOUNTER — Other Ambulatory Visit: Payer: Self-pay | Admitting: Internal Medicine

## 2020-01-22 DIAGNOSIS — E042 Nontoxic multinodular goiter: Secondary | ICD-10-CM

## 2020-02-04 ENCOUNTER — Ambulatory Visit
Admission: RE | Admit: 2020-02-04 | Discharge: 2020-02-04 | Disposition: A | Discharge status: Home / Self Care | Payer: Medicare HMO | Source: Ambulatory Visit | Attending: Internal Medicine | Admitting: Internal Medicine

## 2020-02-04 ENCOUNTER — Other Ambulatory Visit (HOSPITAL_COMMUNITY)
Admission: RE | Admit: 2020-02-04 | Discharge: 2020-02-04 | Disposition: A | Discharge status: Home / Self Care | Payer: Medicare HMO | Source: Ambulatory Visit | Attending: Radiology | Admitting: Radiology

## 2020-02-04 DIAGNOSIS — R896 Abnormal cytological findings in specimens from other organs, systems and tissues: Secondary | ICD-10-CM | POA: Diagnosis not present

## 2020-02-04 DIAGNOSIS — E041 Nontoxic single thyroid nodule: Secondary | ICD-10-CM | POA: Diagnosis not present

## 2020-02-04 DIAGNOSIS — E042 Nontoxic multinodular goiter: Secondary | ICD-10-CM

## 2020-02-05 LAB — CYTOLOGY - NON PAP

## 2020-02-16 DIAGNOSIS — E042 Nontoxic multinodular goiter: Secondary | ICD-10-CM | POA: Diagnosis not present

## 2020-03-03 ENCOUNTER — Encounter (HOSPITAL_COMMUNITY): Payer: Self-pay

## 2020-03-17 ENCOUNTER — Ambulatory Visit: Payer: Medicare HMO | Attending: Internal Medicine

## 2020-03-17 DIAGNOSIS — Z23 Encounter for immunization: Secondary | ICD-10-CM

## 2020-03-17 NOTE — Progress Notes (Signed)
   Covid-19 Vaccination Clinic  Name:  Shelly Leonard    MRN: [id number] DOB: [birth year]  03/17/2020  Shelly Leonard was observed post Covid-19 immunization for 15 minutes without incident. She was provided with Vaccine Information Sheet and instruction to access the V-Safe system.   Shelly Leonard was instructed to call 911 with any severe reactions post vaccine: [name] Kitchen Difficulty breathing  . Swelling of face and throat  . A fast heartbeat  . A bad rash all over body  . Dizziness and weakness   Immunizations Administered    Name Date Dose VIS Date Route   Pfizer COVID-19 Vaccine 03/17/2020 12:41 PM 0.3 mL 12/04/2019 Intramuscular   Manufacturer: [place]   Lot: [id number]   [place]: [id number]

## 2020-03-29 ENCOUNTER — Ambulatory Visit (INDEPENDENT_AMBULATORY_CARE_PROVIDER_SITE_OTHER): Payer: Medicare HMO | Admitting: Family Medicine

## 2020-03-29 ENCOUNTER — Other Ambulatory Visit: Payer: Self-pay

## 2020-03-29 ENCOUNTER — Encounter: Payer: Self-pay | Admitting: Family Medicine

## 2020-03-29 VITALS — BP 137/86 | HR 72 | Temp 98.2°F | Resp 18 | Ht 63.0 in | Wt 165.5 lb

## 2020-03-29 DIAGNOSIS — H16223 Keratoconjunctivitis sicca, not specified as Sjogren's, bilateral: Secondary | ICD-10-CM | POA: Diagnosis not present

## 2020-03-29 DIAGNOSIS — S39012A Strain of muscle, fascia and tendon of lower back, initial encounter: Secondary | ICD-10-CM | POA: Diagnosis not present

## 2020-03-29 DIAGNOSIS — H2513 Age-related nuclear cataract, bilateral: Secondary | ICD-10-CM | POA: Diagnosis not present

## 2020-03-29 MED ORDER — MELOXICAM 15 MG PO TABS: 15.0000 mg | ORAL_TABLET | Freq: Every day | ORAL | 0 refills | Status: DC

## 2020-03-29 NOTE — Progress Notes (Signed)
This visit occurred during the SARS-CoV-2 public health emergency.  Safety protocols were in place, including screening questions prior to the visit, additional usage of staff PPE, and extensive cleaning of exam room while observing appropriate contact time as indicated for disinfecting solutions.    Shelly Leonard , [birth year], 74 y.o., female MRN: [id number] Patient Care Team    Relationship Specialty Notifications Start End  [name], DO PCP - General Family Medicine  08/18/15   Warden Fillers, MD Consulting Physician Ophthalmology  05/08/16   Milus Banister, MD Attending Physician Gastroenterology  05/08/16   Thornell Sartorius, MD Consulting Physician Otolaryngology  06/25/17   Manson Passey, Emerge  Specialist  07/01/18   Madelin Rear, MD Consulting Physician Endocrinology  01/28/20     Chief Complaint  Patient presents with  . Back Pain    Lower back pain x1 week.  Unable to sleep.      Subjective: Pt presents for an OV with complaints of lower lumbar back pain of 1 week duration.  Associated symptoms include pain that radiates across to her lower lumbar spine.  Worse with lying flat and sleeping.. Pt has tried muscle relaxer and Tylenol to ease their symptoms.  She denies radiation of pain down her legs at this time.  She has a history of lumbar spondylosis from 2008 with degenerative and bulging disc at that time mostly affecting the right lower extremity.  Depression screen [facility] 2/9 08/04/2019 02/05/2019 07/01/2018 03/14/2018 09/27/2017  Decreased Interest 0 0 0 0 0  Down, Depressed, Hopeless 0 0 0 0 3  PHQ - 2 Score 0 0 0 0 3  Altered sleeping - - - 0 2  Tired, decreased energy - - - 0 2  Change in appetite - - - 0 1  Feeling bad or failure about yourself  - - - 0 0  Trouble concentrating - - - 0 1  Moving slowly or fidgety/restless - - - 0 0  Suicidal thoughts - - - 0 0  PHQ-9 Score - - - 0 9  Difficult doing work/chores - - - - Somewhat difficult    No Known  Allergies Social History   Social History Narrative   Ms Crummey is widowed (12/2016).    She enjoys music and is involved with her church.   Past Medical History:  Diagnosis Date  . Allergy   . Anemia    low iron (hgb 8)  . Arthritis    knees and hands   . Carpal tunnel syndrome   . Cataract   . Diverticulitis large intestine w/o perforation or abscess w/bleeding   . GERD (gastroesophageal reflux disease)   . Headache(784.0)    occasional   . Hypertension   . Lumbar spondylosis   . Osteopenia   . PONV (postoperative nausea and vomiting)   . Vertigo   . Vitamin D deficiency    Past Surgical History:  Procedure Laterality Date  . BREAST REDUCTION SURGERY  1982  . CARPAL TUNNEL RELEASE     left   . COLONOSCOPY    . FOOT SURGERY    . KNEE ARTHROSCOPY     right knee 2012   . OTHER SURGICAL HISTORY     spur removed top of left foot   . REDUCTION MAMMAPLASTY  1982  . TOTAL KNEE ARTHROPLASTY  04/21/2012   Procedure: TOTAL KNEE ARTHROPLASTY;  Surgeon: [name], MD;  Location: [facility];  Service: Orthopedics;  Laterality: Right;  .  VAGINAL HYSTERECTOMY  1977   Family History  Problem Relation Age of Onset  . Breast cancer Mother        late in life  . Diabetes Mother        late in life  . Heart disease Father   . Lung disease Father   . Colon cancer Neg Hx   . Esophageal cancer Neg Hx   . Stomach cancer Neg Hx   . Rectal cancer Neg Hx    Allergies as of 03/29/2020   No Known Allergies     Medication List       Accurate as of March 29, 2020  1:03 PM. If you have any questions, ask your nurse or doctor.        STOP taking these medications   predniSONE 20 MG tablet Commonly known as: DELTASONE Stopped by: Howard Pouch, DO     TAKE these medications   aspirin EC 81 MG tablet Take 81 mg by mouth daily.   azelastine 0.1 % nasal spray Commonly known as: ASTELIN Place 1-2 sprays into both nostrils 2 (two) times daily.   Azelastine-Fluticasone 137-50  MCG/ACT Susp Place 1 spray into the nose 2 (two) times daily as needed.   ciclopirox 0.77 % cream Commonly known as: LOPROX APPLY  CREAM TOPICALLY TO AFFECTED AREA TWICE DAILY   fluticasone 50 MCG/ACT nasal spray Commonly known as: FLONASE Place 2 sprays into both nostrils daily.   Garlic 123XX123 MG Tabs Take 100 mg by mouth daily with breakfast.   loratadine 10 MG tablet Commonly known as: CLARITIN Take 10 mg by mouth daily.   meloxicam 15 MG tablet Commonly known as: MOBIC Take 1 tablet (15 mg total) by mouth daily. Started by: Howard Pouch, DO   multivitamin with minerals Tabs tablet Take 1 tablet by mouth daily with breakfast.   OMEGA 3 PO Take 1 capsule by mouth daily.   pantoprazole 20 MG tablet Commonly known as: PROTONIX Take 1 tablet (20 mg total) by mouth daily as needed.   PROBIOTIC DAILY PO Take by mouth.   tizanidine 2 MG capsule Commonly known as: Zanaflex Take 1 capsule (2 mg total) by mouth 2 (two) times daily as needed for muscle spasms.   triamterene-hydrochlorothiazide 75-50 MG tablet Commonly known as: MAXZIDE Take 1 tablet by mouth daily.   verapamil 120 MG tablet Commonly known as: CALAN Take 1 tablet (120 mg total) by mouth daily.   VITAMIN D PO Take by mouth.       All past medical history, surgical history, allergies, family history, immunizations andmedications were updated in the EMR today and reviewed under the history and medication portions of their EMR.     ROS: Negative, with the exception of above mentioned in HPI   Objective:  BP 137/86 (BP Location: Right Arm, Patient Position: Sitting, Cuff Size: Normal)   Pulse 72   Temp 98.2 F (36.8 C) (Temporal)   Resp 18   Ht 5\' 3"  (1.6 m)   Wt 165 lb 8 oz (75.1 kg)   SpO2 98%   BMI 29.32 kg/m  Body mass index is 29.32 kg/m. Gen: Afebrile. No acute distress. Nontoxic in appearance, well developed, well nourished.  HENT: AT. [place].  Eyes:Pupils Equal Round Reactive to light,  Extraocular movements intact,  Conjunctiva without redness, discharge or icterus. MSK: Lower lumbar spine, no erythema, no bony tenderness, left paraspinal muscle spasm with tenderness to palpation over this area.  Muscle strength 5/5 right lower extremity,  4+/5 left lower extremity.  Appears uncomfortable with flexion of hip. Neuro: Walking with a mild limp. PERLA. EOMi. Alert. Oriented x3  No exam data present No results found. No results found for this or any previous visit (from the past 24 hour(s)).  Assessment/Plan: Shelly Leonard is a 74 y.o. female present for OV for  Strain of lumbar region, initial encounter Patient exam is consistent with lumbar strain with rather significant muscle spasm of the left lower lumbar spine.  I do have some concern over her left lower extremity weakness, despite her stating that she does not have radiation of pain today-she has had left radiculopathy in the past.  In 2008 most of her symptoms from her lumbar spine right sided and was noted to have multiple bulging disks at that time.  She has not had repeat imaging since that time. -Discussed use of anti-inflammatory for now prescribed Mobic 15 mg daily. -Heating pad 15-20 minutes a few times a day, never apply directly to the skin. -Use her already prescribed Zanaflex twice daily scheduled for the next 5 to 7 days. -Follow-up in 2 weeks if no improvement, sooner if symptoms are worsening and will need to consider further imaging and work-up at that time.  Patient is agreeable.   Reviewed expectations re: course of current medical issues.  Discussed self-management of symptoms.  Outlined signs and symptoms indicating need for more acute intervention.  Patient verbalized understanding and all questions were answered.  Patient received an After-Visit Summary.    No orders of the defined types were placed in this encounter.  Meds ordered this encounter  Medications  . meloxicam (MOBIC) 15 MG  tablet    Sig: Take 1 tablet (15 mg total) by mouth daily.    Dispense:  30 tablet    Refill:  0   Referral Orders  No referral(s) requested today     Note is dictated utilizing voice recognition software. Although note has been proof read prior to signing, occasional typographical errors still can be missed. If any questions arise, please do not hesitate to call for verification.   electronically signed by:  Howard Pouch, DO  [place]

## 2020-03-29 NOTE — Patient Instructions (Signed)
Use meloxicam once a day (with food) for 2 weeks, if helpful we can consider contining long term for arthritic pain.  Take you muscle relaxer twice a day for 7 days.  Use a heating a pad for 15-20 minutes a few times a day.   In 2-3 days> start stretches    Low Back Sprain or Strain Rehab Ask your health care provider which exercises are safe for you. Do exercises exactly as told by your health care provider and adjust them as directed. It is normal to feel mild stretching, pulling, tightness, or discomfort as you do these exercises. Stop right away if you feel sudden pain or your pain gets worse. Do not begin these exercises until told by your health care provider. Stretching and range-of-motion exercises These exercises warm up your muscles and joints and improve the movement and flexibility of your back. These exercises also help to relieve pain, numbness, and tingling. Lumbar rotation  1. Lie on your back on a firm surface and bend your knees. 2. Straighten your arms out to your sides so each arm forms a 90-degree angle (right angle) with a side of your body. 3. Slowly move (rotate) both of your knees to one side of your body until you feel a stretch in your lower back (lumbar). Try not to let your shoulders lift off the floor. 4. Hold this position for __________ seconds. 5. Tense your abdominal muscles and slowly move your knees back to the starting position. 6. Repeat this exercise on the other side of your body. Repeat __________ times. Complete this exercise __________ times a day. Single knee to chest  1. Lie on your back on a firm surface with both legs straight. 2. Bend one of your knees. Use your hands to move your knee up toward your chest until you feel a gentle stretch in your lower back and buttock. ? Hold your leg in this position by holding on to the front of your knee. ? Keep your other leg as straight as possible. 3. Hold this position for __________  seconds. 4. Slowly return to the starting position. 5. Repeat with your other leg. Repeat __________ times. Complete this exercise __________ times a day. Prone extension on elbows  1. Lie on your abdomen on a firm surface (prone position). 2. Prop yourself up on your elbows. 3. Use your arms to help lift your chest up until you feel a gentle stretch in your abdomen and your lower back. ? This will place some of your body weight on your elbows. If this is uncomfortable, try stacking pillows under your chest. ? Your hips should stay down, against the surface that you are lying on. Keep your hip and back muscles relaxed. 4. Hold this position for __________ seconds. 5. Slowly relax your upper body and return to the starting position. Repeat __________ times. Complete this exercise __________ times a day. Strengthening exercises These exercises build strength and endurance in your back. Endurance is the ability to use your muscles for a long time, even after they get tired. Pelvic tilt This exercise strengthens the muscles that lie deep in the abdomen. 1. Lie on your back on a firm surface. Bend your knees and keep your feet flat on the floor. 2. Tense your abdominal muscles. Tip your pelvis up toward the ceiling and flatten your lower back into the floor. ? To help with this exercise, you may place a small towel under your lower back and try to push your back  into the towel. 3. Hold this position for __________ seconds. 4. Let your muscles relax completely before you repeat this exercise. Repeat __________ times. Complete this exercise __________ times a day. Alternating arm and leg raises  1. Get on your hands and knees on a firm surface. If you are on a hard floor, you may want to use padding, such as an exercise mat, to cushion your knees. 2. Line up your arms and legs. Your hands should be directly below your shoulders, and your knees should be directly below your hips. 3. Lift your  left leg behind you. At the same time, raise your right arm and straighten it in front of you. ? Do not lift your leg higher than your hip. ? Do not lift your arm higher than your shoulder. ? Keep your abdominal and back muscles tight. ? Keep your hips facing the ground. ? Do not arch your back. ? Keep your balance carefully, and do not hold your breath. 4. Hold this position for __________ seconds. 5. Slowly return to the starting position. 6. Repeat with your right leg and your left arm. Repeat __________ times. Complete this exercise __________ times a day. Abdominal set with straight leg raise  1. Lie on your back on a firm surface. 2. Bend one of your knees and keep your other leg straight. 3. Tense your abdominal muscles and lift your straight leg up, 4-6 inches (10-15 cm) off the ground. 4. Keep your abdominal muscles tight and hold this position for __________ seconds. ? Do not hold your breath. ? Do not arch your back. Keep it flat against the ground. 5. Keep your abdominal muscles tense as you slowly lower your leg back to the starting position. 6. Repeat with your other leg. Repeat __________ times. Complete this exercise __________ times a day. Single leg lower with bent knees 1. Lie on your back on a firm surface. 2. Tense your abdominal muscles and lift your feet off the floor, one foot at a time, so your knees and hips are bent in 90-degree angles (right angles). ? Your knees should be over your hips and your lower legs should be parallel to the floor. 3. Keeping your abdominal muscles tense and your knee bent, slowly lower one of your legs so your toe touches the ground. 4. Lift your leg back up to return to the starting position. ? Do not hold your breath. ? Do not let your back arch. Keep your back flat against the ground. 5. Repeat with your other leg. Repeat __________ times. Complete this exercise __________ times a day. Posture and body mechanics Good posture  and healthy body mechanics can help to relieve stress in your body's tissues and joints. Body mechanics refers to the movements and positions of your body while you do your daily activities. Posture is part of body mechanics. Good posture means:  Your spine is in its natural S-curve position (neutral).  Your shoulders are pulled back slightly.  Your head is not tipped forward. Follow these guidelines to improve your posture and body mechanics in your everyday activities. Standing   When standing, keep your spine neutral and your feet about hip width apart. Keep a slight bend in your knees. Your ears, shoulders, and hips should line up.  When you do a task in which you stand in one place for a long time, place one foot up on a stable object that is 2-4 inches (5-10 cm) high, such as a footstool. This helps  keep your spine neutral. Sitting   When sitting, keep your spine neutral and keep your feet flat on the floor. Use a footrest, if necessary, and keep your thighs parallel to the floor. Avoid rounding your shoulders, and avoid tilting your head forward.  When working at a desk or a computer, keep your desk at a height where your hands are slightly lower than your elbows. Slide your chair under your desk so you are close enough to maintain good posture.  When working at a computer, place your monitor at a height where you are looking straight ahead and you do not have to tilt your head forward or downward to look at the screen. Resting  When lying down and resting, avoid positions that are most painful for you.  If you have pain with activities such as sitting, bending, stooping, or squatting, lie in a position in which your body does not bend very much. For example, avoid curling up on your side with your arms and knees near your chest (fetal position).  If you have pain with activities such as standing for a long time or reaching with your arms, lie with your spine in a neutral position  and bend your knees slightly. Try the following positions: ? Lying on your side with a pillow between your knees. ? Lying on your back with a pillow under your knees. Lifting   When lifting objects, keep your feet at least shoulder width apart and tighten your abdominal muscles.  Bend your knees and hips and keep your spine neutral. It is important to lift using the strength of your legs, not your back. Do not lock your knees straight out.  Always ask for help to lift heavy or awkward objects. This information is not intended to replace advice given to you by your health care provider. Make sure you discuss any questions you have with your health care provider. Document Revised: 04/03/2019 Document Reviewed: 01/01/2019 Elsevier Patient Education  [place].

## 2020-04-12 ENCOUNTER — Ambulatory Visit: Payer: Medicare HMO | Attending: Internal Medicine

## 2020-04-12 DIAGNOSIS — Z23 Encounter for immunization: Secondary | ICD-10-CM

## 2020-04-12 NOTE — Progress Notes (Signed)
   Covid-19 Vaccination Clinic  Name:  ZANARIA NIEUWENHUIS    MRN: [id number] DOB: 1946-05-04  04/12/2020  Ms. Branstetter was observed post Covid-19 immunization for 15 minutes without incident. She was provided with Vaccine Information Sheet and instruction to access the V-Safe system.   Ms. Bedward was instructed to call 911 with any severe reactions post vaccine: [name] Kitchen Difficulty breathing  . Swelling of face and throat  . A fast heartbeat  . A bad rash all over body  . Dizziness and weakness   Immunizations Administered    Name Date Dose VIS Date Route   Pfizer COVID-19 Vaccine 04/12/2020  2:06 PM 0.3 mL 02/17/2019 Intramuscular   Manufacturer: [place]   Lot: [id number]   [place]: [id number]

## 2020-04-26 ENCOUNTER — Encounter: Payer: Self-pay | Admitting: Family Medicine

## 2020-04-26 ENCOUNTER — Ambulatory Visit (INDEPENDENT_AMBULATORY_CARE_PROVIDER_SITE_OTHER): Payer: Medicare HMO | Admitting: Family Medicine

## 2020-04-26 ENCOUNTER — Other Ambulatory Visit: Payer: Self-pay

## 2020-04-26 DIAGNOSIS — K219 Gastro-esophageal reflux disease without esophagitis: Secondary | ICD-10-CM

## 2020-04-26 DIAGNOSIS — M5410 Radiculopathy, site unspecified: Secondary | ICD-10-CM

## 2020-04-26 DIAGNOSIS — E041 Nontoxic single thyroid nodule: Secondary | ICD-10-CM | POA: Diagnosis not present

## 2020-04-26 DIAGNOSIS — I10 Essential (primary) hypertension: Secondary | ICD-10-CM | POA: Diagnosis not present

## 2020-04-26 MED ORDER — PANTOPRAZOLE SODIUM 20 MG PO TBEC: 20.0000 mg | DELAYED_RELEASE_TABLET | Freq: Every day | ORAL | 1 refills | Status: DC | PRN

## 2020-04-26 MED ORDER — VERAPAMIL HCL 120 MG PO TABS: 120.0000 mg | ORAL_TABLET | Freq: Every day | ORAL | 1 refills | Status: DC

## 2020-04-26 MED ORDER — TRIAMTERENE-HCTZ 75-50 MG PO TABS: 1.0000 | ORAL_TABLET | Freq: Every day | ORAL | 1 refills | Status: DC

## 2020-04-26 MED ORDER — TIZANIDINE HCL 2 MG PO CAPS: 2.0000 mg | ORAL_CAPSULE | Freq: Two times a day (BID) | ORAL | 5 refills | Status: DC | PRN

## 2020-04-26 NOTE — Progress Notes (Signed)
Shelly Leonard , 12-25-1945, 74 y.o., female MRN: [id number] Patient Care Team    Relationship Specialty Notifications Start End  [name], DO PCP - General Family Medicine  08/18/15   Warden Fillers, MD Consulting Physician Ophthalmology  05/08/16   Milus Banister, MD Attending Physician Gastroenterology  05/08/16   Thornell Sartorius, MD Consulting Physician Otolaryngology  06/25/17   Manson Passey, [place]  Specialist  07/01/18   Madelin Rear, MD Consulting Physician Endocrinology  01/28/20     Chief Complaint  Patient presents with  . Hypertension    Pt does not check at home.      Subjective:  Shelly Leonard is a 74 y.o. female. Pt presents today for follow up on chronic medical conditions.   Essential hypertension, benign/morbid obesity Pt reports compliance with Maxzide 75-50 and verapamil 120 mg daily. Blood pressures ranges at home not routinely checked.Patient denies chest pain, shortness of breath, dizziness or lower extremity edema.  Pt takes a daily baby ASA. Pt is not prescribed statin. Labs up-to-date 10/2019 Diet: Low sodium. Exercise: Routine exercise. RF: Hypertension, obesity, family history of heart disease  Gastroesophageal reflux disease, esophagitis presence not specified Compliance with Protonix 20 mg daily. Working well for her.  Hypercalcemia/Thyroid nodule She does have multiple nodules within the thyroid.  Her biopsy was negative for any type of malignancy.  SHe has started to increase her water consumption daily. She is following with endocrinology.  Back pain: Acute lumbar strain from 2 weeks ago has resolved with anti-inflammatory and scheduled muscle relaxer. Depression screen [facility] 2/9 08/04/2019 02/05/2019 07/01/2018 03/14/2018 09/27/2017  Decreased Interest 0 0 0 0 0  Down, Depressed, Hopeless 0 0 0 0 3  PHQ - 2 Score 0 0 0 0 3  Altered sleeping - - - 0 2  Tired, decreased energy - - - 0 2  Change in appetite - - - 0 1  Feeling bad or  failure about yourself  - - - 0 0  Trouble concentrating - - - 0 1  Moving slowly or fidgety/restless - - - 0 0  Suicidal thoughts - - - 0 0  PHQ-9 Score - - - 0 9  Difficult doing work/chores - - - - Somewhat difficult    No Known Allergies Social History   Tobacco Use  . Smoking status: Never Smoker  . Smokeless tobacco: Never Used  Substance Use Topics  . Alcohol use: No   Past Medical History:  Diagnosis Date  . Allergy   . Anemia    low iron (hgb 8)  . Arthritis    knees and hands   . Carpal tunnel syndrome   . Cataract   . Diverticulitis large intestine w/o perforation or abscess w/bleeding   . GERD (gastroesophageal reflux disease)   . Headache(784.0)    occasional   . Hypertension   . Lumbar spondylosis   . Osteopenia   . PONV (postoperative nausea and vomiting)   . Vertigo   . Vitamin D deficiency    Past Surgical History:  Procedure Laterality Date  . BREAST REDUCTION SURGERY  1982  . CARPAL TUNNEL RELEASE     left   . COLONOSCOPY    . FOOT SURGERY    . KNEE ARTHROSCOPY     right knee 2012   . OTHER SURGICAL HISTORY     spur removed top of left foot   . REDUCTION MAMMAPLASTY  1982  . TOTAL KNEE ARTHROPLASTY  04/21/2012  Procedure: TOTAL KNEE ARTHROPLASTY;  Surgeon: [name], MD;  Location: [facility];  Service: Orthopedics;  Laterality: Right;  [name] Kitchen VAGINAL HYSTERECTOMY  1977   Family History  Problem Relation Age of Onset  . Breast cancer Mother        late in life  . Diabetes Mother        late in life  . Heart disease Father   . Lung disease Father   . Colon cancer Neg Hx   . Esophageal cancer Neg Hx   . Stomach cancer Neg Hx   . Rectal cancer Neg Hx    Allergies as of 04/26/2020   No Known Allergies     Medication List       Accurate as of Apr 26, 2020 11:59 PM. If you have any questions, ask your nurse or doctor.        aspirin EC 81 MG tablet Take 81 mg by mouth daily.   azelastine 0.1 % nasal spray Commonly known as:  ASTELIN Place 1-2 sprays into both nostrils 2 (two) times daily.   Azelastine-Fluticasone 137-50 MCG/ACT Susp Place 1 spray into the nose 2 (two) times daily as needed.   ciclopirox 0.77 % cream Commonly known as: LOPROX APPLY  CREAM TOPICALLY TO AFFECTED AREA TWICE DAILY   fluticasone 50 MCG/ACT nasal spray Commonly known as: FLONASE Place 2 sprays into both nostrils daily.   Garlic 123XX123 MG Tabs Take 100 mg by mouth daily with breakfast.   loratadine 10 MG tablet Commonly known as: CLARITIN Take 10 mg by mouth daily.   meloxicam 15 MG tablet Commonly known as: MOBIC Take 1 tablet (15 mg total) by mouth daily.   multivitamin with minerals Tabs tablet Take 1 tablet by mouth daily with breakfast.   OMEGA 3 PO Take 1 capsule by mouth daily.   pantoprazole 20 MG tablet Commonly known as: PROTONIX Take 1 tablet (20 mg total) by mouth daily as needed.   PROBIOTIC DAILY PO Take by mouth.   tizanidine 2 MG capsule Commonly known as: Zanaflex Take 1 capsule (2 mg total) by mouth 2 (two) times daily as needed for muscle spasms.   triamterene-hydrochlorothiazide 75-50 MG tablet Commonly known as: MAXZIDE Take 1 tablet by mouth daily.   verapamil 120 MG tablet Commonly known as: CALAN Take 1 tablet (120 mg total) by mouth daily.   VITAMIN D PO Take by mouth.       All past medical history, surgical history, allergies, family history, immunizations andmedications were updated in the EMR today and reviewed under the history and medication portions of their EMR.     ROS: Negative, with the exception of above mentioned in HPI   Objective:  BP 122/80 (BP Location: Left Arm, Patient Position: Sitting, Cuff Size: Large)   Pulse 68   Temp 97.7 F (36.5 C) (Temporal)   Resp 16   Ht 5\' 3"  (1.6 m)   Wt 164 lb 2 oz (74.4 kg)   SpO2 96%   BMI 29.07 kg/m  Body mass index is 29.07 kg/m. Gen: Afebrile. No acute distress. Nontoxic. Pleasant female. HENT: AT. [place]. MMM.    Eyes:Pupils Equal Round Reactive to light, Extraocular movements intact,  Conjunctiva without redness, discharge or icterus. Neck/lymp/endocrine: Supple, no lymphadenopathy,  thyromegaly present CV: RRR no murmur, no edema Chest: CTAB, no wheeze or crackles Skin: No rashes, purpura or petechiae.  Neuro: . Alert. Oriented.  Psych: Normal affect, dress and demeanor. Normal speech. Normal thought content  and judgment.  No exam data present No results found. No results found for this or any previous visit (from the past 24 hour(s)).  Assessment/Plan: Shelly Leonard is a 74 y.o. female present for OV for  Essential hypertension, benign Stable Continue Maxide  Continue verapamil  -Low-sodium diet, exercise encouraged. -Follow-up 6 months.  Gastroesophageal reflux disease, esophagitis presence not specified Stable Continue Protonix.  Hypercalcemia/Thyroid nodule - established with endocrine now. Continue routine follow-ups. -Biopsy was benign.   -Continue to work on adequate hydration.  Chronic back pain: Her acute pain/strain from 2 weeks ago has resolved Chronic pain does well with Zanaflex 2 mg twice daily as needed. Continue Zanaflex twice daily as needed   Reviewed expectations re: course of current medical issues.  Discussed self-management of symptoms.  Outlined signs and symptoms indicating need for more acute intervention.  Patient verbalized understanding and all questions were answered.  Patient received an After-Visit Summary.    No orders of the defined types were placed in this encounter.  Meds ordered this encounter  Medications  . verapamil (CALAN) 120 MG tablet    Sig: Take 1 tablet (120 mg total) by mouth daily.    Dispense:  90 tablet    Refill:  1  . triamterene-hydrochlorothiazide (MAXZIDE) 75-50 MG tablet    Sig: Take 1 tablet by mouth daily.    Dispense:  90 tablet    Refill:  1  . tizanidine (ZANAFLEX) 2 MG capsule    Sig: Take 1  capsule (2 mg total) by mouth 2 (two) times daily as needed for muscle spasms.    Dispense:  60 capsule    Refill:  5  . pantoprazole (PROTONIX) 20 MG tablet    Sig: Take 1 tablet (20 mg total) by mouth daily as needed.    Dispense:  90 tablet    Refill:  1   Referral Orders  No referral(s) requested today      Note is dictated utilizing voice recognition software. Although note has been proof read prior to signing, occasional typographical errors still can be missed. If any questions arise, please do not hesitate to call for verification.   electronically signed by:  Howard Pouch, DO  [place]

## 2020-04-26 NOTE — Patient Instructions (Addendum)
Great to see you today.  Glad your back feels better.  I have refilled all your chronic medications.   Physical/chronic conditions end of October.  Have a great Summer and Happy Mother's day.

## 2020-06-14 ENCOUNTER — Telehealth: Payer: Self-pay

## 2020-06-14 ENCOUNTER — Other Ambulatory Visit (HOSPITAL_BASED_OUTPATIENT_CLINIC_OR_DEPARTMENT_OTHER): Payer: Self-pay | Admitting: Family Medicine

## 2020-06-14 DIAGNOSIS — M8580 Other specified disorders of bone density and structure, unspecified site: Secondary | ICD-10-CM

## 2020-06-14 DIAGNOSIS — Z1231 Encounter for screening mammogram for malignant neoplasm of breast: Secondary | ICD-10-CM

## 2020-06-14 NOTE — Telephone Encounter (Signed)
Ordered placed for dexa. Mam had been ordered. They should have called her to schedule by now?

## 2020-06-14 NOTE — Telephone Encounter (Signed)
I see that the mammogram was ordered. Pt is asking for bone density also.   Please advise

## 2020-06-14 NOTE — Addendum Note (Signed)
Addended by: [name] on: 06/14/2020 01:11 PM   Modules accepted: Orders

## 2020-06-14 NOTE — Telephone Encounter (Signed)
Pt was called and message was left letting her know that both have been ordered. She was advised to call the imaging center and could contact us if needed.

## 2020-06-14 NOTE — Telephone Encounter (Signed)
Needs order for Bone dent and mammogram  Patient can be reached at [phone]

## 2020-07-03 IMAGING — MG DIGITAL SCREENING BILATERAL MAMMOGRAM WITH TOMO AND CAD
8 of 14 series · 8 of 40 positions shown · non-contrast
Comparison: Previous exam(s).

CLINICAL DATA: Screening.

EXAM:
DIGITAL SCREENING BILATERAL MAMMOGRAM WITH TOMO AND CAD

[R MLO synth-2D (1 of 2)]
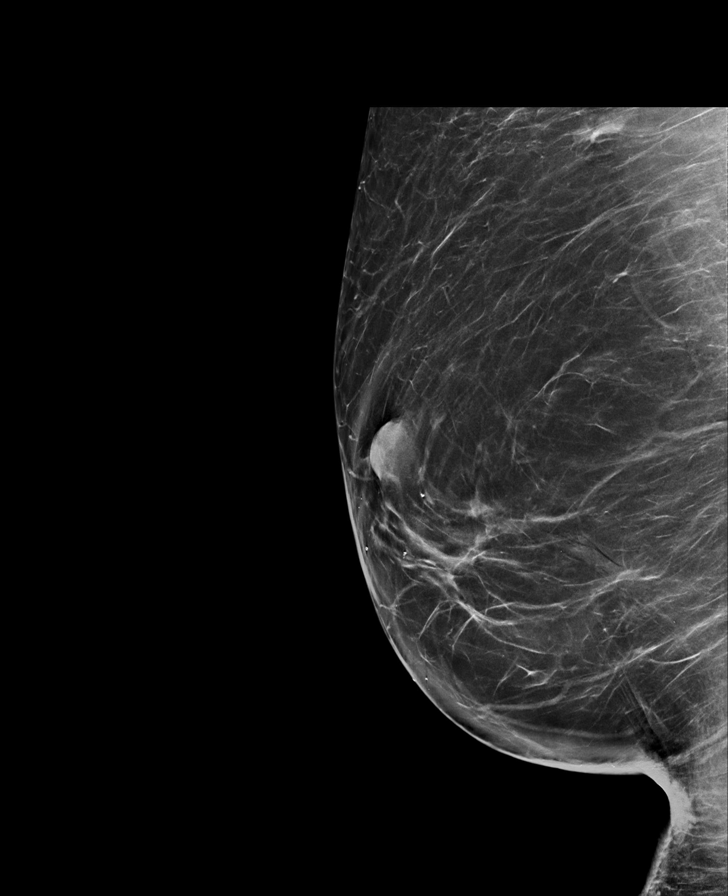

[L MLO synth-2D (1 of 2)]
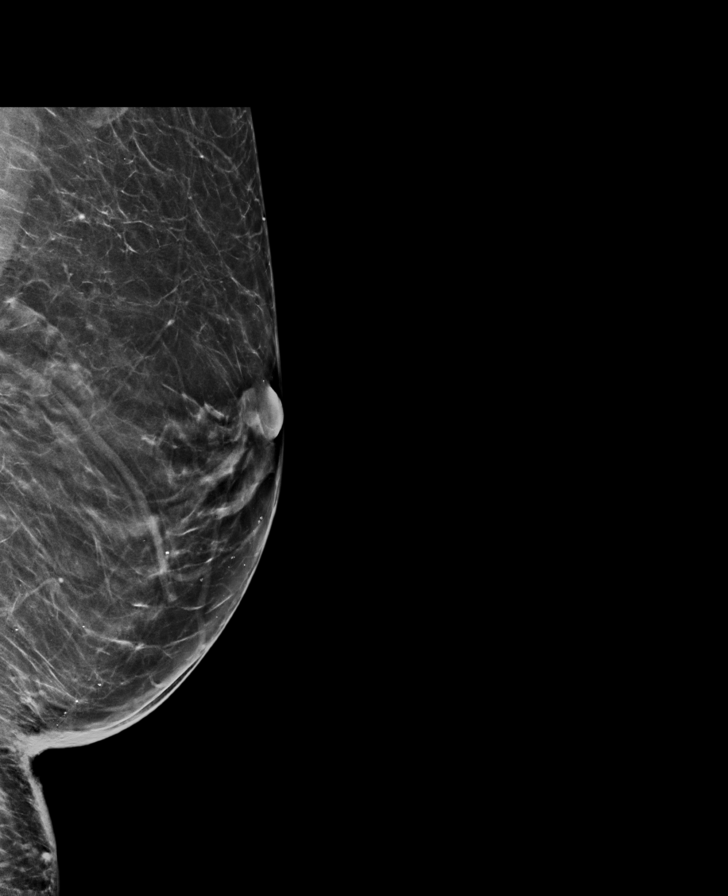

[R XCCL synth-2D]
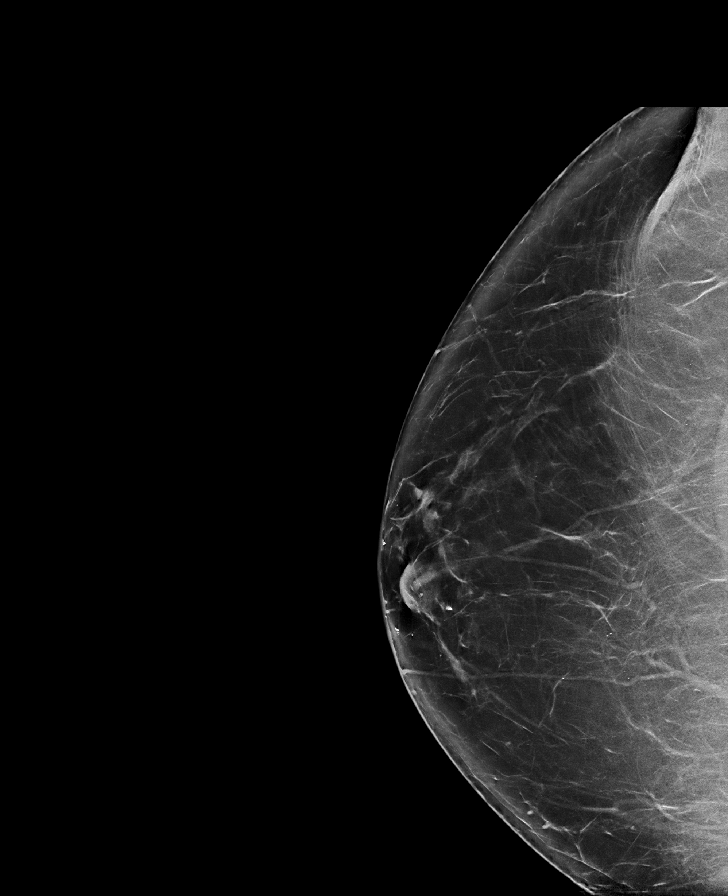

[R MLO synth-2D (2 of 2)]
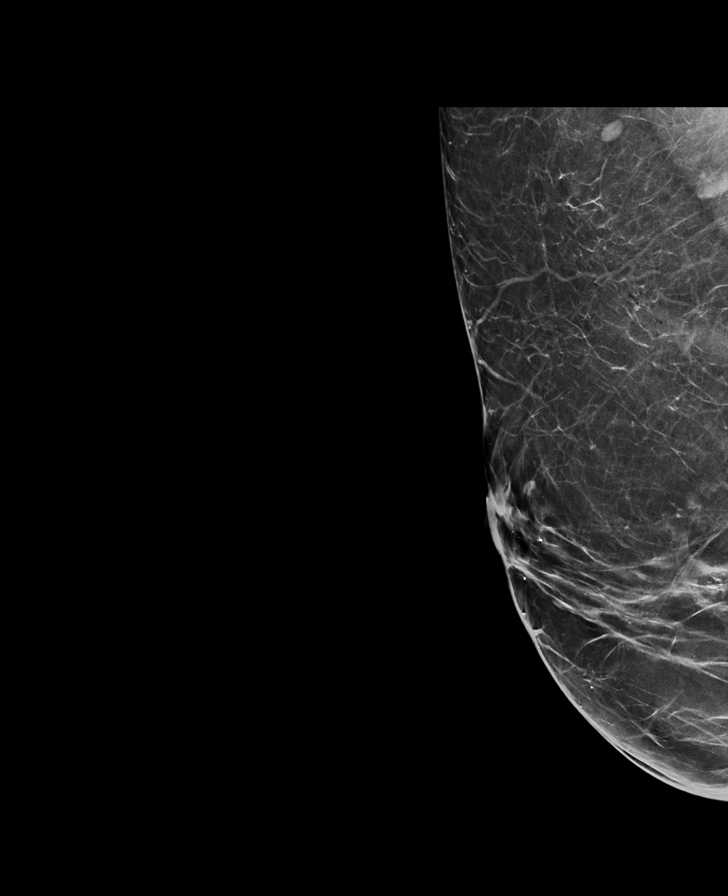

[L MLO synth-2D (2 of 2)]
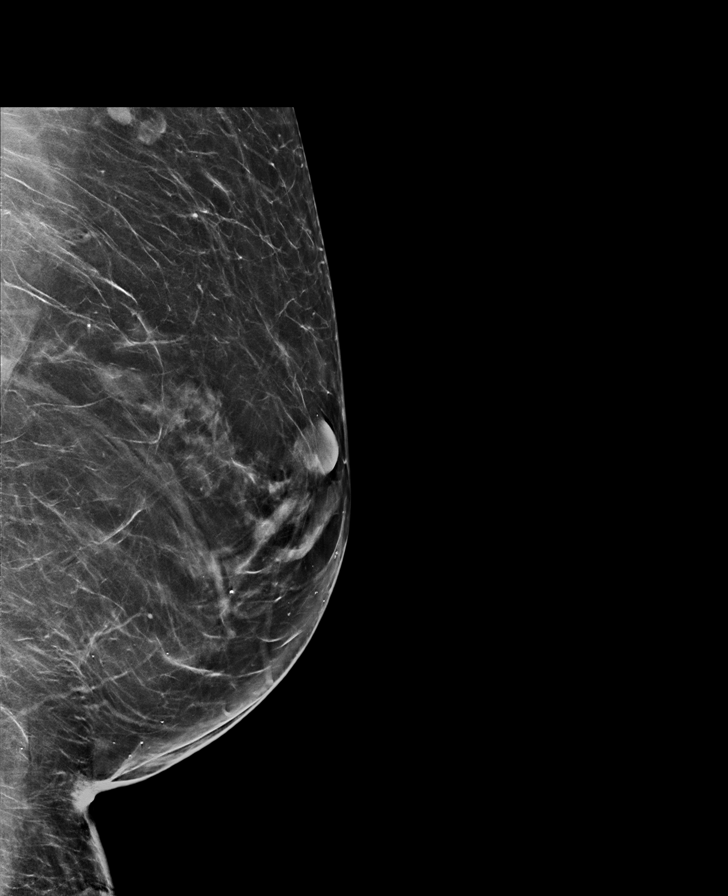

[R CC synth-2D]
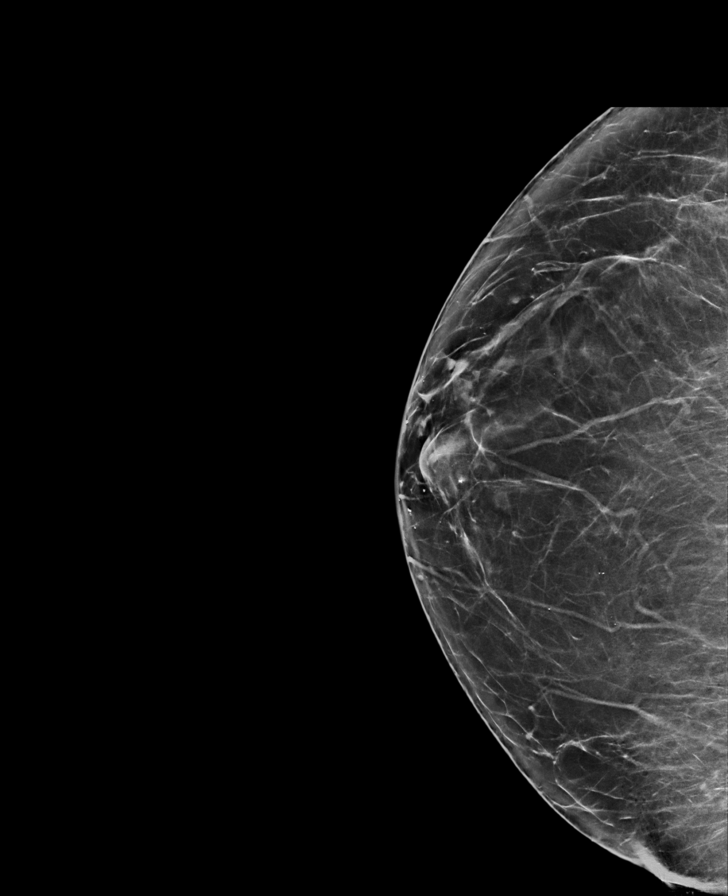

[L CC synth-2D]
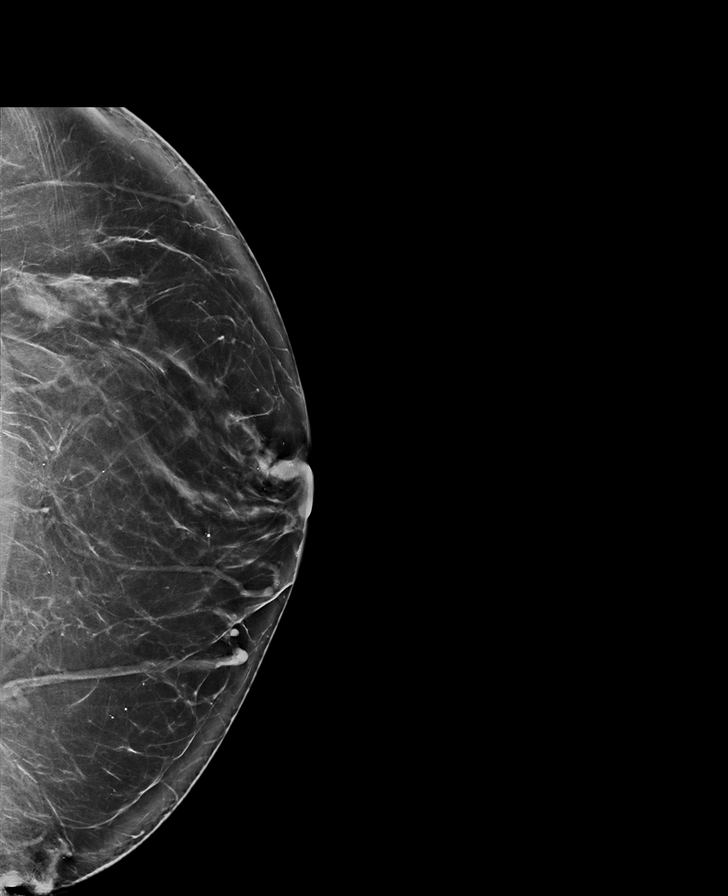

[R CC tomo · tomo slice 37/72.0]
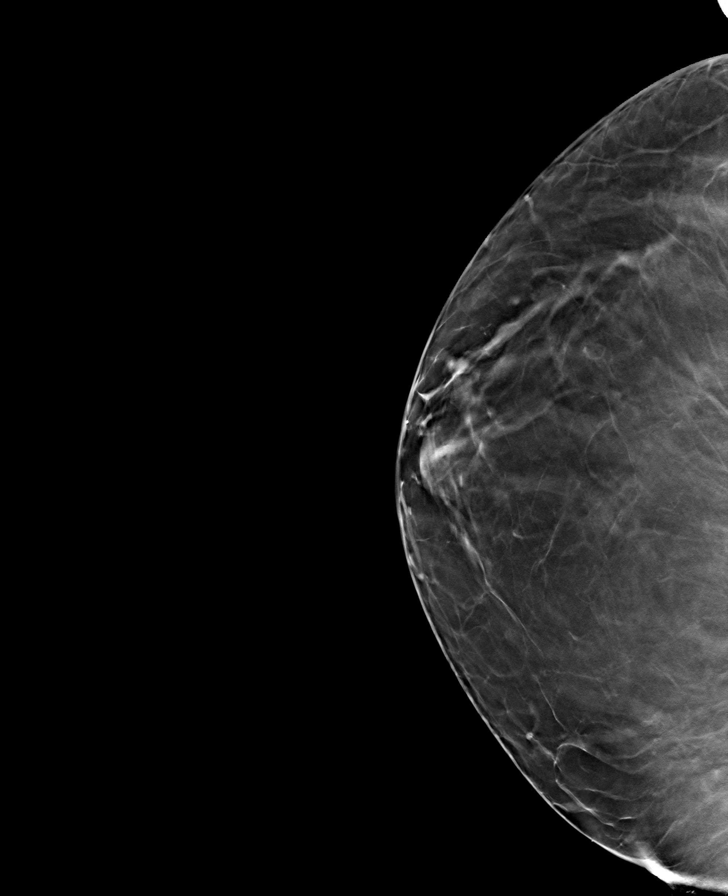

[8 of 40 positions shown; findings below may reference images not displayed]

ACR Breast Density Category b: There are scattered areas of
fibroglandular density.
FINDINGS: There are no findings suspicious for malignancy. Images were
processed with CAD.
IMPRESSION: No mammographic evidence of malignancy. A result letter of this
screening mammogram will be mailed directly to the patient.

RECOMMENDATION:
Screening mammogram in one year. (Code:[id number])

BI-RADS CATEGORY  1: Negative.

## 2020-07-05 ENCOUNTER — Ambulatory Visit: Payer: Medicare HMO | Admitting: Family Medicine

## 2020-07-14 ENCOUNTER — Telehealth: Payer: Self-pay

## 2020-07-14 NOTE — Telephone Encounter (Signed)
Patient was given 90 day supply with 1 refill for both medications on 04/26/20. LM for pt to return call to inform of this information.

## 2020-07-14 NOTE — Telephone Encounter (Signed)
Patient refill request  verapamil (CALAN) 120 MG tablet [[id number]]   triamterene-hydrochlorothiazide (MAXZIDE) 75-50 MG tablet [[id number]]   [place], [place]

## 2020-07-14 NOTE — Telephone Encounter (Signed)
Patient returned call and advised refills available at the pharmacy.

## 2020-07-17 ENCOUNTER — Emergency Department (HOSPITAL_BASED_OUTPATIENT_CLINIC_OR_DEPARTMENT_OTHER)
Admission: EM | Admit: 2020-07-17 | Discharge: 2020-07-17 | Discharge status: Against Medical Advice | Payer: Medicare HMO

## 2020-07-17 ENCOUNTER — Other Ambulatory Visit: Payer: Self-pay

## 2020-07-18 ENCOUNTER — Telehealth: Payer: Self-pay

## 2020-07-18 ENCOUNTER — Telehealth: Payer: Self-pay | Admitting: Gastroenterology

## 2020-07-18 NOTE — Telephone Encounter (Signed)
Patient is having left side abdominal pain. She is requesting an appointment.

## 2020-07-18 NOTE — Telephone Encounter (Signed)
I returned a call to the pt and she tells me that she has already called her PCP and is being seen tomorrow for abd pain.  I asked her if she would like to go ahead and get an appt with our office to have it on the books since we are booking so far out and she declined and states she will call back another time.  She is on the porch and does not have her calendar with her.

## 2020-07-18 NOTE — Telephone Encounter (Signed)
Patient called states she is having a lot of abdominal pain over the weekend please advise

## 2020-07-18 NOTE — Telephone Encounter (Signed)
Pt was called and said GI could not get her in until 08/02/2020. She has no fever and is having normal BM. She was scheduled for tomorrow and educated on when to go to ED.

## 2020-07-19 ENCOUNTER — Other Ambulatory Visit: Payer: Self-pay

## 2020-07-19 ENCOUNTER — Ambulatory Visit (INDEPENDENT_AMBULATORY_CARE_PROVIDER_SITE_OTHER): Payer: Medicare HMO | Admitting: Family Medicine

## 2020-07-19 ENCOUNTER — Encounter: Payer: Self-pay | Admitting: Family Medicine

## 2020-07-19 VITALS — BP 138/86 | HR 90 | Temp 98.1°F | Resp 18 | Ht 63.0 in | Wt 164.1 lb

## 2020-07-19 DIAGNOSIS — K5792 Diverticulitis of intestine, part unspecified, without perforation or abscess without bleeding: Secondary | ICD-10-CM | POA: Diagnosis not present

## 2020-07-19 MED ORDER — AMOXICILLIN-POT CLAVULANATE 875-125 MG PO TABS: 1.0000 | ORAL_TABLET | Freq: Two times a day (BID) | ORAL | 0 refills | Status: DC

## 2020-07-19 NOTE — Progress Notes (Signed)
This visit occurred during the SARS-CoV-2 public health emergency.  Safety protocols were in place, including screening questions prior to the visit, additional usage of staff PPE, and extensive cleaning of exam room while observing appropriate contact time as indicated for disinfecting solutions.    Shelly Leonard , [birth year], 74 y.o., female MRN: [id number] Patient Care Team    Relationship Specialty Notifications Start End  Shelly Hillock, DO PCP - General Family Medicine  08/18/15   Shelly Fillers, MD Consulting Physician Ophthalmology  05/08/16   Shelly Banister, MD Attending Physician Gastroenterology  05/08/16   Shelly Sartorius, MD Consulting Physician Otolaryngology  06/25/17   Shelly Leonard, Emerge  Specialist  07/01/18   Shelly Rear, MD Consulting Physician Endocrinology  01/28/20     Chief Complaint  Patient presents with  . Abdominal Pain    L sided abd pain. Pt went to ED but did leave due to the wait times.      Subjective: Pt presents for an OV with complaints of left-sided abdominal pain that started 2 days ago.  She attempted to go to the emergency room since it was Sunday, but the emergency room was very busy so she left before being seen.  She has a history of reflux and diverticulitis.  She reports on Sunday she became bloated and had abdominal discomfort that is consistent with her prior diverticulitis flares.  She denies fever, chills, diarrhea or constipation.  She usually has about 2 bowel movements a day.  She denies any blood per rectum.  She has been eating a bland diet over the last 2 days.  In the past she has been prescribed Cipro-Flagyl for diverticular flares.  She reports the Flagyl makes her rather ill.  Depression screen [facility] 2/9 08/04/2019 02/05/2019 07/01/2018 03/14/2018 09/27/2017  Decreased Interest 0 0 0 0 0  Down, Depressed, Hopeless 0 0 0 0 3  PHQ - 2 Score 0 0 0 0 3  Altered sleeping - - - 0 2  Tired, decreased energy - - - 0 2  Change in  appetite - - - 0 1  Feeling bad or failure about yourself  - - - 0 0  Trouble concentrating - - - 0 1  Moving slowly or fidgety/restless - - - 0 0  Suicidal thoughts - - - 0 0  PHQ-9 Score - - - 0 9  Difficult doing work/chores - - - - Somewhat difficult    No Known Allergies Social History   Social History Narrative   Shelly Leonard is widowed (12/2016).    She enjoys music and is involved with her church.   Past Medical History:  Diagnosis Date  . Allergy   . Anemia    low iron (hgb 8)  . Arthritis    knees and hands   . Carpal tunnel syndrome   . Cataract   . Diverticulitis large intestine w/o perforation or abscess w/bleeding   . GERD (gastroesophageal reflux disease)   . Headache(784.0)    occasional   . Hypertension   . Lumbar spondylosis   . Osteopenia   . PONV (postoperative nausea and vomiting)   . Vertigo   . Vitamin D deficiency    Past Surgical History:  Procedure Laterality Date  . BREAST REDUCTION SURGERY  1982  . CARPAL TUNNEL RELEASE     left   . COLONOSCOPY    . FOOT SURGERY    . KNEE ARTHROSCOPY     right  knee 2012   . OTHER SURGICAL HISTORY     spur removed top of left foot   . REDUCTION MAMMAPLASTY  1982  . TOTAL KNEE ARTHROPLASTY  04/21/2012   Procedure: TOTAL KNEE ARTHROPLASTY;  Surgeon: [name], MD;  Location: [facility];  Service: Orthopedics;  Laterality: Right;  [name] Kitchen VAGINAL HYSTERECTOMY  1977   Family History  Problem Relation Age of Onset  . Breast cancer Mother        late in life  . Diabetes Mother        late in life  . Heart disease Father   . Lung disease Father   . Colon cancer Neg Hx   . Esophageal cancer Neg Hx   . Stomach cancer Neg Hx   . Rectal cancer Neg Hx    Allergies as of 07/19/2020   No Known Allergies     Medication List       Accurate as of July 19, 2020 12:00 PM. If you have any questions, ask your nurse or doctor.        amoxicillin-clavulanate 875-125 MG tablet Commonly known as: AUGMENTIN Take 1  tablet by mouth 2 (two) times daily. Started by: Howard Pouch, DO   aspirin EC 81 MG tablet Take 81 mg by mouth daily.   azelastine 0.1 % nasal spray Commonly known as: ASTELIN Place 1-2 sprays into both nostrils 2 (two) times daily.   Azelastine-Fluticasone 137-50 MCG/ACT Susp Place 1 spray into the nose 2 (two) times daily as needed.   ciclopirox 0.77 % cream Commonly known as: LOPROX APPLY  CREAM TOPICALLY TO AFFECTED AREA TWICE DAILY   fluticasone 50 MCG/ACT nasal spray Commonly known as: FLONASE Place 2 sprays into both nostrils daily.   Garlic 562 MG Tabs Take 100 mg by mouth daily with breakfast.   loratadine 10 MG tablet Commonly known as: CLARITIN Take 10 mg by mouth daily.   meloxicam 15 MG tablet Commonly known as: MOBIC Take 1 tablet (15 mg total) by mouth daily.   multivitamin with minerals Tabs tablet Take 1 tablet by mouth daily with breakfast.   OMEGA 3 PO Take 1 capsule by mouth daily.   pantoprazole 20 MG tablet Commonly known as: PROTONIX Take 1 tablet (20 mg total) by mouth daily as needed.   PROBIOTIC DAILY PO Take by mouth.   tizanidine 2 MG capsule Commonly known as: Zanaflex Take 1 capsule (2 mg total) by mouth 2 (two) times daily as needed for muscle spasms.   triamterene-hydrochlorothiazide 75-50 MG tablet Commonly known as: MAXZIDE Take 1 tablet by mouth daily.   verapamil 120 MG tablet Commonly known as: CALAN Take 1 tablet (120 mg total) by mouth daily.   VITAMIN D PO Take by mouth.       All past medical history, surgical history, allergies, family history, immunizations andmedications were updated in the EMR today and reviewed under the history and medication portions of their EMR.     ROS: Negative, with the exception of above mentioned in HPI   Objective:  BP (!) 138/86 (BP Location: Left Arm, Patient Position: Sitting, Cuff Size: Normal)   Pulse 90   Temp 98.1 F (36.7 C) (Temporal)   Resp 18   Ht 5\' 3"  (1.6  m)   Wt 164 lb 2 oz (74.4 kg)   SpO2 99%   BMI 29.07 kg/m  Body mass index is 29.07 kg/m. Gen: Afebrile. No acute distress. Nontoxic in appearance, well developed, well nourished.  HENT: AT. [place].  Eyes:Pupils Equal Round Reactive to light, Extraocular movements intact,  Conjunctiva without redness, discharge or icterus. CV: RRR  Chest: CTAB, no wheeze or crackles. Good air movement, normal resp effort.  Abd: Soft.  Obese.  Tender to palpation left lower quadrant.  ND. BS present.  No masses palpated. No rebound or guarding.   Neuro:  Normal gait. PERLA. EOMi. Alert. Oriented x3  Psych: Normal affect, dress and demeanor. Normal speech. Normal thought content and judgment.  No exam data present No results found. No results found for this or any previous visit (from the past 24 hour(s)).  Assessment/Plan: AITANA BURRY is a 74 y.o. female present for OV for  Diverticulitis Rest. Continue to hydrate. Can use Tylenol for discomfort. Start Augmentin twice daily With food. Continue bland diet, and slowly advance as tolerated Follow-up 1 week if symptoms are not resolved, sooner if worsening    Reviewed expectations re: course of current medical issues.  Discussed self-management of symptoms.  Outlined signs and symptoms indicating need for more acute intervention.  Patient verbalized understanding and all questions were answered.  Patient received an After-Visit Summary.    No orders of the defined types were placed in this encounter.  Meds ordered this encounter  Medications  . amoxicillin-clavulanate (AUGMENTIN) 875-125 MG tablet    Sig: Take 1 tablet by mouth 2 (two) times daily.    Dispense:  20 tablet    Refill:  0   Referral Orders  No referral(s) requested today     Note is dictated utilizing voice recognition software. Although note has been proof read prior to signing, occasional typographical errors still can be missed. If any questions arise, please do  not hesitate to call for verification.   electronically signed by:  Howard Pouch, DO  [place]

## 2020-07-19 NOTE — Patient Instructions (Signed)

## 2020-08-02 ENCOUNTER — Encounter (HOSPITAL_BASED_OUTPATIENT_CLINIC_OR_DEPARTMENT_OTHER): Payer: Self-pay

## 2020-08-02 ENCOUNTER — Ambulatory Visit (HOSPITAL_BASED_OUTPATIENT_CLINIC_OR_DEPARTMENT_OTHER)
Admission: RE | Admit: 2020-08-02 | Discharge: 2020-08-02 | Disposition: A | Discharge status: Home / Self Care | Payer: Medicare HMO | Source: Ambulatory Visit | Attending: Family Medicine | Admitting: Family Medicine

## 2020-08-02 ENCOUNTER — Other Ambulatory Visit: Payer: Self-pay

## 2020-08-02 DIAGNOSIS — M8580 Other specified disorders of bone density and structure, unspecified site: Secondary | ICD-10-CM

## 2020-08-02 DIAGNOSIS — M8589 Other specified disorders of bone density and structure, multiple sites: Secondary | ICD-10-CM | POA: Diagnosis not present

## 2020-08-02 DIAGNOSIS — Z780 Asymptomatic menopausal state: Secondary | ICD-10-CM | POA: Diagnosis not present

## 2020-08-02 DIAGNOSIS — Z1231 Encounter for screening mammogram for malignant neoplasm of breast: Secondary | ICD-10-CM | POA: Diagnosis not present

## 2020-08-04 ENCOUNTER — Telehealth: Payer: Self-pay | Admitting: Family Medicine

## 2020-08-04 NOTE — Telephone Encounter (Signed)
Error

## 2020-08-09 ENCOUNTER — Encounter: Payer: Self-pay | Admitting: Family Medicine

## 2020-08-09 ENCOUNTER — Other Ambulatory Visit: Payer: Self-pay

## 2020-08-09 ENCOUNTER — Ambulatory Visit (INDEPENDENT_AMBULATORY_CARE_PROVIDER_SITE_OTHER): Payer: Medicare HMO | Admitting: Family Medicine

## 2020-08-09 VITALS — BP 119/79 | HR 68 | Temp 98.5°F | Resp 16 | Ht 63.0 in | Wt 166.2 lb

## 2020-08-09 DIAGNOSIS — Z0000 Encounter for general adult medical examination without abnormal findings: Secondary | ICD-10-CM

## 2020-08-09 DIAGNOSIS — R430 Anosmia: Secondary | ICD-10-CM

## 2020-08-09 DIAGNOSIS — I10 Essential (primary) hypertension: Secondary | ICD-10-CM

## 2020-08-09 DIAGNOSIS — Z1231 Encounter for screening mammogram for malignant neoplasm of breast: Secondary | ICD-10-CM

## 2020-08-09 DIAGNOSIS — K219 Gastro-esophageal reflux disease without esophagitis: Secondary | ICD-10-CM | POA: Diagnosis not present

## 2020-08-09 DIAGNOSIS — E041 Nontoxic single thyroid nodule: Secondary | ICD-10-CM

## 2020-08-09 LAB — CBC
HCT: 43 % (ref 36.0–46.0)
Hemoglobin: 14 g/dL (ref 12.0–15.0)
MCHC: 32.6 g/dL (ref 30.0–36.0)
MCV: 89 fl (ref 78.0–100.0)
Platelets: 283 10*3/uL (ref 150.0–400.0)
RBC: 4.83 Mil/uL (ref 3.87–5.11)
RDW: 14 % (ref 11.5–15.5)
WBC: 6.5 10*3/uL (ref 4.0–10.5)

## 2020-08-09 LAB — BASIC METABOLIC PANEL
BUN: 11 mg/dL (ref 6–23)
CO2: 28 mEq/L (ref 19–32)
Calcium: 10.7 mg/dL — ABNORMAL HIGH (ref 8.4–10.5)
Chloride: 99 mEq/L (ref 96–112)
Creatinine, Ser: 0.53 mg/dL (ref 0.40–1.20)
GFR: 136.89 mL/min (ref >60.00)
Glucose, Bld: 88 mg/dL (ref 70–99)
Potassium: 3.5 mEq/L (ref 3.5–5.1)
Sodium: 134 mEq/L — ABNORMAL LOW (ref 135–145)

## 2020-08-09 LAB — T3, FREE: T3, Free: 3.5 pg/mL (ref 2.3–4.2)

## 2020-08-09 LAB — LDL CHOLESTEROL, DIRECT: Direct LDL: 109 mg/dL

## 2020-08-09 LAB — T4, FREE: Free T4: 0.75 ng/dL (ref 0.60–1.60)

## 2020-08-09 LAB — TSH: TSH: 0.59 u[IU]/mL (ref 0.35–4.50)

## 2020-08-09 MED ORDER — TRIAMTERENE-HCTZ 75-50 MG PO TABS: 1.0000 | ORAL_TABLET | Freq: Every day | ORAL | 1 refills | Status: DC

## 2020-08-09 MED ORDER — VERAPAMIL HCL 120 MG PO TABS: 120.0000 mg | ORAL_TABLET | Freq: Every day | ORAL | 1 refills | Status: DC

## 2020-08-09 MED ORDER — FLUTICASONE PROPIONATE 50 MCG/ACT NA SUSP: 2.0000 | Freq: Every day | NASAL | 5 refills | Status: DC

## 2020-08-09 MED ORDER — ZOSTER VAC RECOMB ADJUVANTED 50 MCG/0.5ML IM SUSR: 0.5000 mL | Freq: Once | INTRAMUSCULAR | 1 refills | Status: AC

## 2020-08-09 MED ORDER — PANTOPRAZOLE SODIUM 20 MG PO TBEC: 20.0000 mg | DELAYED_RELEASE_TABLET | Freq: Every day | ORAL | 1 refills | Status: DC | PRN

## 2020-08-09 NOTE — Progress Notes (Signed)
Medicare AWV, subsequent Chief Complaint  Patient presents with  . Medicare Wellness    Patient Care Team    Relationship Specialty Notifications Start End  [name], DO PCP - General Family Medicine  08/18/15   Warden Fillers, MD Consulting Physician Ophthalmology  05/08/16   Milus Banister, MD Attending Physician Gastroenterology  05/08/16   Thornell Sartorius, MD Consulting Physician Otolaryngology  06/25/17   Manson Passey, Emerge  Specialist  07/01/18   Madelin Rear, MD Consulting Physician Endocrinology  01/28/20     Chief Complaint  Patient presents with  . Medicare Wellness    History of Present Ilness: Shelly Leonard, 74 y.o. , female presents today for Medicare wellness visit.  BP 119/79 (BP Location: Left Arm, Patient Position: Sitting, Cuff Size: Normal)   Pulse 68   Temp 98.5 F (36.9 C) (Temporal)   Resp 16   Ht 5\' 3"  (1.6 m)   Wt 166 lb 3.2 oz (75.4 kg)   SpO2 98%   BMI 29.44 kg/m  Health maintenance:  Colonoscopy:05/29/2016- 11 yr, Dr. Edison Nasuti Mammogram (50-74): 07/2020- Normal. [facility]> placed order for 2022 Cervical cancer screening(<65): N/A Immunizations: tdap 2012, PNA series completed, covid series completed. Declines flu shot (allergy).  Shingrix prescription printed for her. Infectious disease screening: Hep C completed Glaucoma screen: 02/2020- Dr. Oswaldo Conroy DEXA: 07/2020 3 yr follow up> osteopenia -2.0 Hearing: Whisper test, no barriers identified.   Cognitive assessment:  Word recall (daisy, blue, church): 3/3 Clock draw: face/numbers drawn -normal, 10 after 11 -normal  Essential hypertension, benign/morbid obesity Pt reports  plants withMaxzide 75-50 and verapamil 120 mg daily. Blood pressures ranges at homenot routinely checked.Patient denies chest pain, shortness of breath, dizziness or lower extremity edema.  Pttakes adaily baby ASA. Pt is notprescribed statin. Labs due Diet: Low sodium. Exercise: Routine exercise. RF:  Hypertension, obesity, family history of heart disease  Gastroesophageal reflux disease, esophagitis presence not specified Patient reports compliance with Protonix 20 mg daily.   Medicine is working well for her  Hypercalcemia/Thyroid nodule She does have multiple nodules within the thyroid.  Her biopsy was negative for any type of malignancy.  SHe has started to increase her water consumption daily. She is following with endocrinology.  She is due for follow-up labs.   Past Medical History:  Diagnosis Date  . Allergy   . Anemia    low iron (hgb 8)  . Arthritis    knees and hands   . Carpal tunnel syndrome   . Cataract   . Diverticulitis large intestine w/o perforation or abscess w/bleeding   . GERD (gastroesophageal reflux disease)   . Headache(784.0)    occasional   . Hypertension   . Lumbar spondylosis   . Osteopenia   . PONV (postoperative nausea and vomiting)   . Vertigo   . Vitamin D deficiency    All allergies reviewed No Known Allergies Past Surgical History:  Procedure Laterality Date  . BREAST REDUCTION SURGERY  1982  . CARPAL TUNNEL RELEASE     left   . COLONOSCOPY    . FOOT SURGERY    . KNEE ARTHROSCOPY     right knee 2012   . OTHER SURGICAL HISTORY     spur removed top of left foot   . REDUCTION MAMMAPLASTY  1982  . TOTAL KNEE ARTHROPLASTY  04/21/2012   Procedure: TOTAL KNEE ARTHROPLASTY;  Surgeon: [name], MD;  Location: [facility];  Service: Orthopedics;  Laterality: Right;  .  VAGINAL HYSTERECTOMY  1977   Family History  Problem Relation Age of Onset  . Breast cancer Mother        late in life  . Diabetes Mother        late in life  . Heart disease Father   . Lung disease Father   . Colon cancer Neg Hx   . Esophageal cancer Neg Hx   . Stomach cancer Neg Hx   . Rectal cancer Neg Hx    Social History   Social History Narrative   Shelly Leonard is widowed (12/2016).    She enjoys music and is involved with her church.    All medications  verified Allergies as of 08/09/2020   No Known Allergies     Medication List       Accurate as of August 09, 2020  1:09 PM. If you have any questions, ask your nurse or doctor.        STOP taking these medications   amoxicillin-clavulanate 875-125 MG tablet Commonly known as: AUGMENTIN Stopped by: Howard Pouch, DO   azelastine 0.1 % nasal spray Commonly known as: ASTELIN Stopped by: Howard Pouch, DO   Azelastine-Fluticasone 137-50 MCG/ACT Susp Stopped by: Howard Pouch, DO   multivitamin with minerals Tabs tablet Stopped by: Howard Pouch, DO     TAKE these medications   aspirin EC 81 MG tablet Take 81 mg by mouth daily.   ciclopirox 0.77 % cream Commonly known as: LOPROX APPLY  CREAM TOPICALLY TO AFFECTED AREA TWICE DAILY   fluticasone 50 MCG/ACT nasal spray Commonly known as: FLONASE Place 2 sprays into both nostrils daily.   Garlic 397 MG Tabs Take 100 mg by mouth daily with breakfast.   loratadine 10 MG tablet Commonly known as: CLARITIN Take 10 mg by mouth daily.   meloxicam 15 MG tablet Commonly known as: MOBIC Take 1 tablet (15 mg total) by mouth daily.   OMEGA 3 PO Take 1 capsule by mouth daily.   pantoprazole 20 MG tablet Commonly known as: PROTONIX Take 1 tablet (20 mg total) by mouth daily as needed.   PROBIOTIC DAILY PO Take by mouth.   tizanidine 2 MG capsule Commonly known as: Zanaflex Take 1 capsule (2 mg total) by mouth 2 (two) times daily as needed for muscle spasms.   triamterene-hydrochlorothiazide 75-50 MG tablet Commonly known as: MAXZIDE Take 1 tablet by mouth daily.   verapamil 120 MG tablet Commonly known as: CALAN Take 1 tablet (120 mg total) by mouth daily.   VITAMIN D PO Take by mouth.       Depression screen [facility] 2/9 08/09/2020 08/04/2019 02/05/2019 07/01/2018 03/14/2018  Decreased Interest 0 0 0 0 0  Down, Depressed, Hopeless 1 0 0 0 0  PHQ - 2 Score 1 0 0 0 0  Altered sleeping - - - - 0  Tired, decreased energy -  - - - 0  Change in appetite - - - - 0  Feeling bad or failure about yourself  - - - - 0  Trouble concentrating - - - - 0  Moving slowly or fidgety/restless - - - - 0  Suicidal thoughts - - - - 0  PHQ-9 Score - - - - 0  Difficult doing work/chores - - - - -   No flowsheet data found.   Immunizations: Immunization History  Administered Date(s) Administered  . PFIZER SARS-COV-2 Vaccination 03/17/2020, 04/12/2020  . Pneumococcal Conjugate-13 04/01/2014  . Pneumococcal Polysaccharide-23 11/29/2015  . Td 08/14/2011  .  Zoster 01/20/2013    Exercise:  She participates in routine exercises daily    Diet: Regular  Functional Status Survey: Get up and go test: steady and less than 20 seconds.  Is the patient deaf or have difficulty hearing?: No Does the patient have difficulty seeing, even when wearing glasses/contacts?: No Does the patient have difficulty concentrating, remembering, or making decisions?: No Does the patient have difficulty walking or climbing stairs?: No Does the patient have difficulty dressing or bathing?: No Does the patient have difficulty doing errands alone such as visiting a doctor's office or shopping?: No     Fall Risk  08/09/2020 08/04/2019 02/05/2019 07/01/2018 06/25/2017  Falls in the past year? 0 0 0 No No  Number falls in past yr: 0 0 0 - -  Injury with Fall? 0 0 0 - -  Follow up Falls evaluation completed Falls prevention discussed Falls evaluation completed - -    Advanced Care Planning: N- has paperwork to complete and return.   Cardiovascular Screening Blood Tests Lipid Panel     Component Value Date/Time   CHOL 164 11/04/2019 1314   CHOL 173 04/01/2014 0000   TRIG 138.0 11/04/2019 1314   TRIG 90 04/01/2014 0000   HDL 53.00 11/04/2019 1314   CHOLHDL 3 11/04/2019 1314   VLDL 27.6 11/04/2019 1314   LDLCALC 84 11/04/2019 1314   LDLCALC 52 04/01/2014 0000   BP 119/79 (BP Location: Left Arm, Patient Position: Sitting, Cuff Size: Normal)    Pulse 68   Temp 98.5 F (36.9 C) (Temporal)   Resp 16   Ht 5\' 3"  (1.6 m)   Wt 166 lb 3.2 oz (75.4 kg)   SpO2 98%   BMI 29.44 kg/m  Gen: Afebrile. No acute distress.  Nontoxic in appearance well developed, well nourished very pleasant female HENT: AT. [place].  MMM.  Eyes:Pupils Equal Round Reactive to light, Extraocular movements intact,  Conjunctiva without redness, discharge or icterus. Neck/lymp/endocrine: Supple, no lymphadenopathy, no thyromegaly CV: RRR no murmur, no edema, +2/4 P posterior tibialis pulses Chest: CTAB, no wheeze or crackles Abd: Soft. NTND. BS present.  No masses palpated.  Skin: No rashes, purpura or petechiae.  Neuro:  Normal gait. PERLA. EOMi. Alert. Oriented x3  Psych: Normal affect, dress and demeanor. Normal speech. Normal thought content and judgment.  Assessment and Plan: Essential hypertension, benign Stable Continue Maxide  Continue verapamil  CBC, CMP, lipid panel collected today -Low-sodium diet, exercise encouraged. -Follow-up 6 months.  Gastroesophageal reflux disease, esophagitis presence not specified Stable Continue Protonix.  Hypercalcemia/Thyroid nodule - established with endocrine. Continue routine follow-ups. -Biopsy was benign.   -Continue to work on adequate hydration. -TSH, T3 free and T4 free collected today we will fax results to endocrinologist  Chronic back pain: Stable Chronic pain does well with Zanaflex 2 mg twice daily as needed.  Patient reports she rarely needs.  Medicare wellness, subsequent: Patient was encouraged to exercise greater than 150 minutes a week. Patient was encouraged to choose a diet filled with fresh fruits and vegetables, and lean meats. AVS provided to patient today for education/recommendation on gender specific health and safety maintenance. Colonoscopy:05/29/2016- 74 yr, Dr. Edison Nasuti Mammogram (50-74): 07/2020- Normal. [facility]> placed order for 2022 Immunizations: Up-to-date and Shingrix prescription  printed for her today  Infectious disease screening: Completed Glaucoma screen: 02/2020- Dr. Oswaldo Conroy DEXA: 3 yr follow up due 07/2023> osteopenia -2.0 No barriers identified in hearing.  Patient's vision and eye screenings are up-to-date. Cognitive function tests are normal  today. She was encouraged to complete advance directive packet and return.  Meds ordered this encounter  Medications  . Zoster Vaccine Adjuvanted [facility]) injection    Sig: Inject 0.5 mLs into the muscle once for 1 dose. Rpt once in 2-6 months.    Dispense:  0.5 mL    Refill:  1  . triamterene-hydrochlorothiazide (MAXZIDE) 75-50 MG tablet    Sig: Take 1 tablet by mouth daily.    Dispense:  90 tablet    Refill:  1  . verapamil (CALAN) 120 MG tablet    Sig: Take 1 tablet (120 mg total) by mouth daily.    Dispense:  90 tablet    Refill:  1  . pantoprazole (PROTONIX) 20 MG tablet    Sig: Take 1 tablet (20 mg total) by mouth daily as needed.    Dispense:  90 tablet    Refill:  1  . fluticasone (FLONASE) 50 MCG/ACT nasal spray    Sig: Place 2 sprays into both nostrils daily.    Dispense:  16 g    Refill:  5    Replacing dymista   Orders Placed This Encounter  Procedures  . MM 3D SCREEN BREAST BILATERAL  . TSH  . T4, free  . T3, free  . Direct LDL  . Basic Metabolic Panel (BMET)  . CBC    Medicare wellness was completed today,  as well as > 30 Minutes was dedicated to this patient's encounter to include pre-visit review of chart, face-to-face time with patient and post-visit work- which include documentation and prescribing medications and/or ordering test when necessary for Medicare wellness and chronic medical conditions  Electronically Signed by: Howard Pouch, DO [place]

## 2020-08-09 NOTE — Patient Instructions (Addendum)
Next appt in beginning of January.   I have printed the shingles prescription for you. It is a two shot series. Make sure to have 2nd dose 2-6 months after first dose.    Shelly Leonard , Thank you for taking time to come for your Medicare Wellness Visit. I appreciate your ongoing commitment to your health goals. Please review the following plan we discussed and let me know if I can assist you in the future.   All your health maintenance is up to date. After your shingle shots you will be due for your tetanus next August 2022.   This is a list of the screening recommended for you and due dates:  Health Maintenance  Topic Date Due  . Mammogram  08/02/2021  . Tetanus Vaccine  08/13/2021  . DEXA scan (bone density measurement)  08/03/2023  . Colon Cancer Screening  05/29/2026  . COVID-19 Vaccine  Completed  .  Hepatitis C: One time screening is recommended by Center for Disease Control  (CDC) for  adults born from [birth year] through 1964.   Completed  . Pneumonia vaccines  Addressed  . Flu Shot  Discontinued      Preventive Care 65 Years and Older, Female Preventive care refers to lifestyle choices and visits with your health care provider that can promote health and wellness. This includes:  A yearly physical exam. This is also called an annual well check.  Regular dental and eye exams.  Immunizations.  Screening for certain conditions.  Healthy lifestyle choices, such as diet and exercise. What can I expect for my preventive care visit? Physical exam Your health care provider will check:  Height and weight. These may be used to calculate body mass index (BMI), which is a measurement that tells if you are at a healthy weight.  Heart rate and blood pressure.  Your skin for abnormal spots. Counseling Your health care provider may ask you questions about:  Alcohol, tobacco, and drug use.  Emotional well-being.  Home and relationship well-being.  Sexual activity.  Eating  habits.  History of falls.  Memory and ability to understand (cognition).  Work and work Statistician.  Pregnancy and menstrual history. What immunizations do I need?  Influenza (flu) vaccine  This is recommended every year. Tetanus, diphtheria, and pertussis (Tdap) vaccine  You may need a Td booster every 10 years. Varicella (chickenpox) vaccine  You may need this vaccine if you have not already been vaccinated. Zoster (shingles) vaccine  You may need this after age 68. Pneumococcal conjugate (PCV13) vaccine  One dose is recommended after age 43. Pneumococcal polysaccharide (PPSV23) vaccine  One dose is recommended after age 57. Measles, mumps, and rubella (MMR) vaccine  You may need at least one dose of MMR if you were born in 1957 or later. You may also need a second dose. Meningococcal conjugate (MenACWY) vaccine  You may need this if you have certain conditions. Hepatitis A vaccine  You may need this if you have certain conditions or if you travel or work in places where you may be exposed to hepatitis A. Hepatitis B vaccine  You may need this if you have certain conditions or if you travel or work in places where you may be exposed to hepatitis B. Haemophilus influenzae type b (Hib) vaccine  You may need this if you have certain conditions. You may receive vaccines as individual doses or as more than one vaccine together in one shot (combination vaccines). Talk with your health care provider  about the risks and benefits of combination vaccines. What tests do I need? Blood tests  Lipid and cholesterol levels. These may be checked every 5 years, or more frequently depending on your overall health.  Hepatitis C test.  Hepatitis B test. Screening  Lung cancer screening. You may have this screening every year starting at age 13 if you have a 30-pack-year history of smoking and currently smoke or have quit within the past 15 years.  Colorectal cancer screening.  All adults should have this screening starting at age 82 and continuing until age 36. Your health care provider may recommend screening at age 50 if you are at increased risk. You will have tests every 1-10 years, depending on your results and the type of screening test.  Diabetes screening. This is done by checking your blood sugar (glucose) after you have not eaten for a while (fasting). You may have this done every 1-3 years.  Mammogram. This may be done every 1-2 years. Talk with your health care provider about how often you should have regular mammograms.  BRCA-related cancer screening. This may be done if you have a family history of breast, ovarian, tubal, or peritoneal cancers. Other tests  Sexually transmitted disease (STD) testing.  Bone density scan. This is done to screen for osteoporosis. You may have this done starting at age 5. Follow these instructions at home: Eating and drinking  Eat a diet that includes fresh fruits and vegetables, whole grains, lean protein, and low-fat dairy products. Limit your intake of foods with high amounts of sugar, saturated fats, and salt.  Take vitamin and mineral supplements as recommended by your health care provider.  Do not drink alcohol if your health care provider tells you not to drink.  If you drink alcohol: ? Limit how much you have to 0-1 drink a day. ? Be aware of how much alcohol is in your drink. In the [place] one drink equals one 12 oz bottle of beer (355 mL), one 5 oz glass of wine (148 mL), or one 1 oz glass of hard liquor (44 mL). Lifestyle  Take daily care of your teeth and gums.  Stay active. Exercise for at least 30 minutes on 5 or more days each week.  Do not use any products that contain nicotine or tobacco, such as cigarettes, e-cigarettes, and chewing tobacco. If you need help quitting, ask your health care provider.  If you are sexually active, practice safe sex. Use a condom or other form of protection in order  to prevent STIs (sexually transmitted infections).  Talk with your health care provider about taking a low-dose aspirin or statin. What's next?  Go to your health care provider once a year for a well check visit.  Ask your health care provider how often you should have your eyes and teeth checked.  Stay up to date on all vaccines. This information is not intended to replace advice given to you by your health care provider. Make sure you discuss any questions you have with your health care provider. Document Revised: 12/04/2018 Document Reviewed: 12/04/2018 Elsevier Patient Education  2020 [employer].

## 2020-08-10 NOTE — Progress Notes (Signed)
Left VM and faxed results to Endocrinology

## 2020-12-20 ENCOUNTER — Telehealth: Payer: Self-pay | Admitting: Family Medicine

## 2020-12-20 NOTE — Telephone Encounter (Signed)
Patient wants RX for antibiotics for her sinus infection. Rudely refused video visit several times, stated she does not do "all that technology stuff." She says this is a recurrent problem that is in her chart and Dr. Claiborne Billings should just call in antibiotics for her.

## 2020-12-20 NOTE — Telephone Encounter (Addendum)
LVM for pt to call back to discuss.    Please advise, see Karen's message  Pt states that she does not have a computer to do a Video visit and she has done telephone visits before. Also advised to go to urgent care if telephone visit nor VV can be done due to flu season and COVID possibilities.

## 2020-12-21 NOTE — Telephone Encounter (Signed)
Please advise patient I will see her this time for a telephone visit.  We cannot guarantee we will be able to accommodate her telephone visits and the future, since we are not in control of those options.  We must abide by [facility] and insurance company roles.  She could also be aware if we need to collect any type of labs, we will ask her to come to the parking lot to be tested in our drive up testing.

## 2020-12-21 NOTE — Telephone Encounter (Signed)
Pt states that she got something OTC and is feeling better. She said that if she needs an appt she will call and let us know.

## 2020-12-29 ENCOUNTER — Telehealth (INDEPENDENT_AMBULATORY_CARE_PROVIDER_SITE_OTHER): Payer: Medicare HMO | Admitting: Family Medicine

## 2020-12-29 ENCOUNTER — Encounter: Payer: Self-pay | Admitting: Family Medicine

## 2020-12-29 DIAGNOSIS — Z6831 Body mass index (BMI) 31.0-31.9, adult: Secondary | ICD-10-CM | POA: Insufficient documentation

## 2020-12-29 DIAGNOSIS — J329 Chronic sinusitis, unspecified: Secondary | ICD-10-CM

## 2020-12-29 DIAGNOSIS — B9689 Other specified bacterial agents as the cause of diseases classified elsewhere: Secondary | ICD-10-CM | POA: Diagnosis not present

## 2020-12-29 DIAGNOSIS — Z8639 Personal history of other endocrine, nutritional and metabolic disease: Secondary | ICD-10-CM | POA: Insufficient documentation

## 2020-12-29 MED ORDER — PREDNISONE 20 MG PO TABS: ORAL_TABLET | ORAL | 0 refills | Status: DC

## 2020-12-29 MED ORDER — AMOXICILLIN-POT CLAVULANATE 875-125 MG PO TABS: 1.0000 | ORAL_TABLET | Freq: Two times a day (BID) | ORAL | 0 refills | Status: DC

## 2020-12-29 NOTE — Progress Notes (Signed)
VIRTUAL VISIT VIA telephone  I connected with Shelly Leonard on 12/29/20 at  9:00 AM EST by elemedicine application and verified that I am speaking with the correct person using two identifiers. Location patient: Home Location provider: [facility], Office Persons participating in the virtual visit: Patient, Dr. Raoul Pitch and Samul Dada, CMA  I discussed the limitations of evaluation and management by telemedicine and the availability of in person appointments. The patient expressed understanding and agreed to proceed.  SUBJECTIVE Chief Complaint  Patient presents with  . Headache    Pt c/o HA, nasal congestion x 1 week;     HPI: Shelly Leonard is a 75 y.o. female present for headache, nasal congestion of  >1 week.  She denies fever, chills, or GI sx. No sick contacts OTC - sinus decongestant and flonase.  Decreased appetite but keeping hydrated She has had covid vaccines and booster.   ROS: See pertinent positives and negatives per HPI.  Patient Active Problem List   Diagnosis Date Noted  . History of endocrine disorder 12/29/2020  . Body mass index (BMI) 31.0-31.9, adult 12/29/2020  . Thyroid nodule 05/21/2018  . Hypercalcemia 05/09/2018  . Gastroesophageal reflux disease 03/14/2018  . Chronic sinusitis 03/08/2017  . Anosmia 02/20/2017  . Morbid obesity ([place]) 02/20/2017  . Osteopenia 05/08/2016  . Vitamin D deficiency 10/11/2015  . Anemia, iron deficiency 08/23/2015  . Essential hypertension, benign 07/28/2015  . Back pain with left-sided radiculopathy 05/19/2015  . Diverticulitis of colon 05/19/2015    Social History   Tobacco Use  . Smoking status: Never Smoker  . Smokeless tobacco: Never Used  Substance Use Topics  . Alcohol use: No    Current Outpatient Medications:  .  amoxicillin-clavulanate (AUGMENTIN) 875-125 MG tablet, Take 1 tablet by mouth 2 (two) times daily., Disp: 20 tablet, Rfl: 0 .  aspirin EC 81 MG tablet, Take 81 mg by mouth daily.,  Disp: , Rfl:  .  Cholecalciferol (VITAMIN D PO), Take by mouth., Disp: , Rfl:  .  fluticasone (FLONASE) 50 MCG/ACT nasal spray, Place 2 sprays into both nostrils daily., Disp: 16 g, Rfl: 5 .  Garlic 123XX123 MG TABS, Take 100 mg by mouth daily with breakfast. , Disp: , Rfl:  .  Omega-3 Fatty Acids (OMEGA 3 PO), Take 1 capsule by mouth daily., Disp: , Rfl:  .  pantoprazole (PROTONIX) 20 MG tablet, Take 1 tablet (20 mg total) by mouth daily as needed., Disp: 90 tablet, Rfl: 1 .  predniSONE (DELTASONE) 20 MG tablet, 60 mg x3d, 40 mg x3d, 20 mg x2d, 10 mg x2d, Disp: 18 tablet, Rfl: 0 .  Probiotic Product (PROBIOTIC DAILY PO), Take by mouth., Disp: , Rfl:  .  tizanidine (ZANAFLEX) 2 MG capsule, Take 1 capsule (2 mg total) by mouth 2 (two) times daily as needed for muscle spasms., Disp: 60 capsule, Rfl: 5 .  triamterene-hydrochlorothiazide (MAXZIDE) 75-50 MG tablet, Take 1 tablet by mouth daily., Disp: 90 tablet, Rfl: 1 .  verapamil (CALAN) 120 MG tablet, Take 1 tablet (120 mg total) by mouth daily., Disp: 90 tablet, Rfl: 1  No Known Allergies  OBJECTIVE: There were no vitals taken for this visit. Gen: No acute distress. Nontoxic in appearance.  HENT: AT. [place].  MMM.  Eyes:Pupils Equal Round Reactive to light, Extraocular movements intact,  Conjunctiva without redness, discharge or icterus. Chest: Cough or shortness of breath not present Skin: no rashes, purpura or petechiae.  Neuro: Alert. Oriented x3   ASSESSMENT  AND PLAN: [name] is a 75 y.o. female present for  Bacterial sinusitis Rest, hydrate.  + flonase, mucinex (DM if cough), nettie pot or nasal saline.  Augmentin and pred prescribed, take until completed.  Declines Covid testing - "nothing up her nose" If cough present it can last up to 6-8 weeks.  F/U 2 weeks of not improved.   21 minutes spent with pt today for telephone encounter.    Felix Pacini, DO 12/29/2020   Return if symptoms worsen or fail to improve.  No orders  of the defined types were placed in this encounter.  Meds ordered this encounter  Medications  . amoxicillin-clavulanate (AUGMENTIN) 875-125 MG tablet    Sig: Take 1 tablet by mouth 2 (two) times daily.    Dispense:  20 tablet    Refill:  0  . predniSONE (DELTASONE) 20 MG tablet    Sig: 60 mg x3d, 40 mg x3d, 20 mg x2d, 10 mg x2d    Dispense:  18 tablet    Refill:  0   Referral Orders  No referral(s) requested today

## 2021-01-11 ENCOUNTER — Other Ambulatory Visit: Payer: Self-pay | Admitting: Family Medicine

## 2021-01-11 DIAGNOSIS — I10 Essential (primary) hypertension: Secondary | ICD-10-CM

## 2021-02-09 ENCOUNTER — Other Ambulatory Visit: Payer: Self-pay

## 2021-02-10 ENCOUNTER — Encounter: Payer: Self-pay | Admitting: Family Medicine

## 2021-02-10 ENCOUNTER — Ambulatory Visit (INDEPENDENT_AMBULATORY_CARE_PROVIDER_SITE_OTHER): Payer: Medicare HMO | Admitting: Family Medicine

## 2021-02-10 VITALS — BP 129/83 | HR 72 | Temp 98.0°F | Ht 63.0 in | Wt 163.0 lb

## 2021-02-10 DIAGNOSIS — K219 Gastro-esophageal reflux disease without esophagitis: Secondary | ICD-10-CM

## 2021-02-10 DIAGNOSIS — E041 Nontoxic single thyroid nodule: Secondary | ICD-10-CM | POA: Diagnosis not present

## 2021-02-10 DIAGNOSIS — D508 Other iron deficiency anemias: Secondary | ICD-10-CM | POA: Diagnosis not present

## 2021-02-10 DIAGNOSIS — R5383 Other fatigue: Secondary | ICD-10-CM

## 2021-02-10 DIAGNOSIS — I10 Essential (primary) hypertension: Secondary | ICD-10-CM | POA: Diagnosis not present

## 2021-02-10 DIAGNOSIS — Z79899 Other long term (current) drug therapy: Secondary | ICD-10-CM

## 2021-02-10 DIAGNOSIS — E559 Vitamin D deficiency, unspecified: Secondary | ICD-10-CM | POA: Diagnosis not present

## 2021-02-10 LAB — MAGNESIUM: Magnesium: 1.9 mg/dL (ref 1.5–2.5)

## 2021-02-10 MED ORDER — PANTOPRAZOLE SODIUM 20 MG PO TBEC: 20.0000 mg | DELAYED_RELEASE_TABLET | Freq: Every day | ORAL | 1 refills | Status: DC | PRN

## 2021-02-10 MED ORDER — TRIAMTERENE-HCTZ 75-50 MG PO TABS: 1.0000 | ORAL_TABLET | Freq: Every day | ORAL | 1 refills | Status: DC

## 2021-02-10 MED ORDER — TIZANIDINE HCL 2 MG PO CAPS: 2.0000 mg | ORAL_CAPSULE | Freq: Two times a day (BID) | ORAL | 5 refills | Status: DC | PRN

## 2021-02-10 MED ORDER — VERAPAMIL HCL 120 MG PO TABS: 120.0000 mg | ORAL_TABLET | Freq: Two times a day (BID) | ORAL | 1 refills | Status: DC

## 2021-02-10 NOTE — Patient Instructions (Signed)
Very nice to see you today.  Remember your BP pill is two times a day.   We will call early next week with labs.   Take one of the muscle relaxers before bed.   Next appt 5.5 mos for your chronic conditions.

## 2021-02-10 NOTE — Progress Notes (Signed)
Patient Care Team    Relationship Specialty Notifications Start End  Shelly Hillock, DO PCP - General Family Medicine  08/18/15   Warden Fillers, MD Consulting Physician Ophthalmology  05/08/16   Milus Banister, MD Attending Physician Gastroenterology  05/08/16   Thornell Sartorius, MD Consulting Physician Otolaryngology  06/25/17   Manson Passey, [place]  Specialist  07/01/18   Madelin Rear, MD Consulting Physician Endocrinology  01/28/20     Chief Complaint  Patient presents with  . Fatigue    Pt c/o fatigue x 1 mo    History of Present Ilness: Shelly Leonard, 75 y.o. , female presents today for [facility] and acute condition of fatigue.  Essential hypertension, benign/morbid obesity Pt reports compliance withMaxzide 75-50 and verapamil 120 mg daily. Blood pressures ranges at homenot routinely checked. Patient denies chest pain, shortness of breath, dizziness or lower extremity edema.  Pttakes adaily baby ASA. Pt is notprescribed statin. Labs due Diet: Low sodium. Exercise: Routine exercise. RF: Hypertension, obesity, family history of heart disease  Gastroesophageal reflux disease, esophagitis presence not specified Patient reports compliance with Protonix 20 mg daily.   Medicine is working well for her  Hypercalcemia/Thyroid nodule She does have multiple nodules within the thyroid.  Her biopsy was negative for any type of malignancy.  SHe has started to increase her water consumption daily. She is following with endocrinology.    Fatigue: Pt reports worsening fatigue over the last month.   Past Medical History:  Diagnosis Date  . Allergy   . Anemia    low iron (hgb 8)  . Arthritis    knees and hands   . Carpal tunnel syndrome   . Cataract   . Diverticulitis large intestine w/o perforation or abscess w/bleeding   . GERD (gastroesophageal reflux disease)   . Headache(784.0)    occasional   . Hypertension   . Lumbar spondylosis   . Osteopenia   . PONV  (postoperative nausea and vomiting)   . Vertigo   . Vitamin D deficiency    All allergies reviewed No Known Allergies Past Surgical History:  Procedure Laterality Date  . BREAST REDUCTION SURGERY  1982  . CARPAL TUNNEL RELEASE     left   . COLONOSCOPY    . FOOT SURGERY    . KNEE ARTHROSCOPY     right knee 2012   . OTHER SURGICAL HISTORY     spur removed top of left foot   . REDUCTION MAMMAPLASTY  1982  . TOTAL KNEE ARTHROPLASTY  04/21/2012   Procedure: TOTAL KNEE ARTHROPLASTY;  Surgeon: [name], MD;  Location: [facility];  Service: Orthopedics;  Laterality: Right;  [name] Kitchen VAGINAL HYSTERECTOMY  1977   Family History  Problem Relation Age of Onset  . Breast cancer Mother        late in life  . Diabetes Mother        late in life  . Heart disease Father   . Lung disease Father   . Colon cancer Neg Hx   . Esophageal cancer Neg Hx   . Stomach cancer Neg Hx   . Rectal cancer Neg Hx    Social History   Social History Narrative   Ms Leonard is widowed (12/2016).    She enjoys music and is involved with her church.    All medications verified Allergies as of 02/10/2021   No Known Allergies     Medication List       Accurate  as of February 10, 2021  1:38 PM. If you have any questions, ask your nurse or doctor.        STOP taking these medications   amoxicillin-clavulanate 875-125 MG tablet Commonly known as: AUGMENTIN Stopped by: Howard Pouch, DO   predniSONE 20 MG tablet Commonly known as: DELTASONE Stopped by: Howard Pouch, DO     TAKE these medications   aspirin EC 81 MG tablet Take 81 mg by mouth daily.   fluticasone 50 MCG/ACT nasal spray Commonly known as: FLONASE Place 2 sprays into both nostrils daily.   Garlic 102 MG Tabs Take 100 mg by mouth daily with breakfast.   OMEGA 3 PO Take 1 capsule by mouth daily.   pantoprazole 20 MG tablet Commonly known as: PROTONIX Take 1 tablet (20 mg total) by mouth daily as needed.   PROBIOTIC DAILY PO Take  by mouth.   tizanidine 2 MG capsule Commonly known as: Zanaflex Take 1 capsule (2 mg total) by mouth 2 (two) times daily as needed for muscle spasms.   triamterene-hydrochlorothiazide 75-50 MG tablet Commonly known as: MAXZIDE Take 1 tablet by mouth daily.   verapamil 120 MG tablet Commonly known as: CALAN Take 1 tablet (120 mg total) by mouth 2 (two) times daily. What changed: when to take this Changed by: Howard Pouch, DO   VITAMIN D PO Take by mouth.       Depression screen [facility] 2/9 02/10/2021 08/09/2020 08/04/2019 02/05/2019 07/01/2018  Decreased Interest 0 0 0 0 0  Down, Depressed, Hopeless 0 1 0 0 0  PHQ - 2 Score 0 1 0 0 0  Altered sleeping - - - - -  Tired, decreased energy - - - - -  Change in appetite - - - - -  Feeling bad or failure about yourself  - - - - -  Trouble concentrating - - - - -  Moving slowly or fidgety/restless - - - - -  Suicidal thoughts - - - - -  PHQ-9 Score - - - - -  Difficult doing work/chores - - - - -   No flowsheet data found.   Immunizations: Immunization History  Administered Date(s) Administered  . PFIZER(Purple Top)SARS-COV-2 Vaccination 03/17/2020, 04/12/2020, 11/30/2020  . Pneumococcal Conjugate-13 04/01/2014  . Pneumococcal Polysaccharide-23 11/29/2015  . Td 08/14/2011  . Zoster 01/20/2013   Objective:  BP 129/83   Pulse 72   Temp 98 F (36.7 C) (Oral)   Ht 5' 3"  (1.6 m)   Wt 163 lb (73.9 kg)   SpO2 98%   BMI 28.87 kg/m  Gen: Afebrile. No acute distress.  HENT: AT. [place]. Bilateral TM visualized and normal in appearance. MMM. Bilateral nares within normal limits. Throat without erythema or exudates.  Within normal limits.  No cough.  No hoarseness. Eyes:Pupils Equal Round Reactive to light, Extraocular movements intact,  Conjunctiva without redness, discharge or icterus. Neck/lymp/endocrine: Supple, no lymphadenopathy, no thyromegaly CV: RRR no murmur, no edema, +2/4 P posterior tibialis pulses Chest: CTAB, no wheeze or  crackles Skin: no rashes, purpura or petechiae.  Neuro:  Normal gait. PERLA. EOMi. Alert. Oriented x3 Psych: Normal affect, dress and demeanor. Normal speech. Normal thought content and judgment.    Assessment and Plan :Shelly Leonard is a 75 y.o. female present for [facility] and fatigue Essential hypertension, benign Stable.  Continue  Maxide  continue  verapamil BID -Low-sodium diet, exercise encouraged. -Follow-up 5.5 months.  Gastroesophageal reflux disease, esophagitis presence not specified/long term use of ppi  Stable.  Continue  Protonix. Vitd, b12 and mag collected today  Hypercalcemia/Thyroid nodule - established with endocrine. Continue routine follow-ups. -Biopsy was benign.   -Continue to work on adequate hydration. - tsh, t4, t3 and cmp collected today  Chronic back pain: Stable.  continue  Zanaflex 2 mg twice daily as needed. Encouraged her to take before bed.  Patient reports she rarely needs.  Fatigue:  Poss. Multifactorial. Her husband passed away 02-17-2017 and this time of the year is always difficult for her. Her bp med was to be BID but was only taking QD- fatigue from BP?  Poss thyroid causes w/ her history Poss elevated calcium causing.  Lack of adequate sleep from her grief and/or back discomfort.     Meds ordered this encounter  Medications  . pantoprazole (PROTONIX) 20 MG tablet    Sig: Take 1 tablet (20 mg total) by mouth daily as needed.    Dispense:  90 tablet    Refill:  1  . tizanidine (ZANAFLEX) 2 MG capsule    Sig: Take 1 capsule (2 mg total) by mouth 2 (two) times daily as needed for muscle spasms.    Dispense:  60 capsule    Refill:  5  . triamterene-hydrochlorothiazide (MAXZIDE) 75-50 MG tablet    Sig: Take 1 tablet by mouth daily.    Dispense:  90 tablet    Refill:  1  . verapamil (CALAN) 120 MG tablet    Sig: Take 1 tablet (120 mg total) by mouth 2 (two) times daily.    Dispense:  180 tablet    Refill:  1   Orders Placed This  Encounter  Procedures  . CBC with Differential/Platelet  . TSH  . Vitamin B12  . Comp Met (CMET)  . Vitamin D (25 hydroxy)  . Magnesium  . T3, free  . T4, free    Medicare wellness was completed today,  as well as > 30 Minutes was dedicated to this patient's encounter to include pre-visit review of chart, face-to-face time with patient and post-visit work- which include documentation and prescribing medications and/or ordering test when necessary for Medicare wellness and chronic medical conditions  Electronically Signed by: Howard Pouch, DO [place]

## 2021-02-11 LAB — CBC WITH DIFFERENTIAL/PLATELET
Absolute Monocytes: 638 cells/uL (ref 200–950)
Basophils Absolute: 40 cells/uL (ref 0–200)
Basophils Relative: 0.9 %
Eosinophils Absolute: 180 cells/uL (ref 15–500)
Eosinophils Relative: 4.1 %
HCT: 41.5 % (ref 35.0–45.0)
Hemoglobin: 13.8 g/dL (ref 11.7–15.5)
Lymphs Abs: 2152 cells/uL (ref 850–3900)
MCH: 29.2 pg (ref 27.0–33.0)
MCHC: 33.3 g/dL (ref 32.0–36.0)
MCV: 87.9 fL (ref 80.0–100.0)
MPV: 10 fL (ref 7.5–12.5)
Monocytes Relative: 14.5 %
Neutro Abs: 1390 cells/uL — ABNORMAL LOW (ref 1500–7800)
Neutrophils Relative %: 31.6 %
Platelets: 272 10*3/uL (ref 140–400)
RBC: 4.72 10*6/uL (ref 3.80–5.10)
RDW: 13.4 % (ref 11.0–15.0)
Total Lymphocyte: 48.9 %
WBC: 4.4 10*3/uL (ref 3.8–10.8)

## 2021-02-11 LAB — T4, FREE: Free T4: 1 ng/dL (ref 0.8–1.8)

## 2021-02-11 LAB — COMPREHENSIVE METABOLIC PANEL
AG Ratio: 1.2 (calc) (ref 1.0–2.5)
ALT: 16 U/L (ref 6–29)
AST: 19 U/L (ref 10–35)
Albumin: 3.8 g/dL (ref 3.6–5.1)
Alkaline phosphatase (APISO): 48 U/L (ref 37–153)
BUN: 13 mg/dL (ref 7–25)
CO2: 25 mmol/L (ref 20–32)
Calcium: 10.3 mg/dL (ref 8.6–10.4)
Chloride: 102 mmol/L (ref 98–110)
Creat: 0.62 mg/dL (ref 0.60–0.93)
Globulin: 3.1 g/dL (calc) (ref 1.9–3.7)
Glucose, Bld: 81 mg/dL (ref 65–99)
Potassium: 3.6 mmol/L (ref 3.5–5.3)
Sodium: 137 mmol/L (ref 135–146)
Total Bilirubin: 0.4 mg/dL (ref 0.2–1.2)
Total Protein: 6.9 g/dL (ref 6.1–8.1)

## 2021-02-11 LAB — VITAMIN B12: Vitamin B-12: 542 pg/mL (ref 200–1100)

## 2021-02-11 LAB — T3, FREE: T3, Free: 2.7 pg/mL (ref 2.3–4.2)

## 2021-02-11 LAB — VITAMIN D 25 HYDROXY (VIT D DEFICIENCY, FRACTURES): Vit D, 25-Hydroxy: 26 ng/mL — ABNORMAL LOW (ref 30–100)

## 2021-02-11 LAB — TSH: TSH: 0.39 mIU/L — ABNORMAL LOW (ref 0.40–4.50)

## 2021-02-14 ENCOUNTER — Telehealth: Payer: Self-pay | Admitting: Family Medicine

## 2021-02-14 NOTE — Telephone Encounter (Signed)
Spoke with pt regarding labs and instructions.   

## 2021-02-14 NOTE — Telephone Encounter (Signed)
.  gecs

## 2021-02-14 NOTE — Telephone Encounter (Signed)
Please call patient Liver and kidney function are normal Blood cell counts and electrolytes are normal Tsh is mildly low- but stable from prior collections. Continue to follow w. Endocrine.  B12 levels are normal.  Vit d levels are low > I would encouraged her to increase her current Vit d supplement by 1000 u Magnesium levels are below goal. She would benefit from adding 250-500 mg magnesium OTC to her daily regimen.

## 2021-03-10 ENCOUNTER — Encounter: Payer: Self-pay | Admitting: Family Medicine

## 2021-03-10 ENCOUNTER — Telehealth: Payer: Self-pay | Admitting: Family Medicine

## 2021-03-10 ENCOUNTER — Ambulatory Visit (INDEPENDENT_AMBULATORY_CARE_PROVIDER_SITE_OTHER): Payer: Medicare HMO | Admitting: Family Medicine

## 2021-03-10 ENCOUNTER — Other Ambulatory Visit: Payer: Self-pay

## 2021-03-10 VITALS — BP 147/81 | HR 69 | Temp 98.1°F | Ht 63.0 in | Wt 164.0 lb

## 2021-03-10 DIAGNOSIS — K5792 Diverticulitis of intestine, part unspecified, without perforation or abscess without bleeding: Secondary | ICD-10-CM | POA: Diagnosis not present

## 2021-03-10 MED ORDER — AMOXICILLIN-POT CLAVULANATE 875-125 MG PO TABS: 1.0000 | ORAL_TABLET | Freq: Two times a day (BID) | ORAL | 0 refills | Status: DC

## 2021-03-10 NOTE — Telephone Encounter (Signed)
Patient requesting refill of "antibiotic for my diverticulitis flare up." I pressed patient for name of medication and she insisted that she did not know but it is in her chart and Dr. Raoul Pitch will know what it is.

## 2021-03-10 NOTE — Patient Instructions (Signed)
Diverticulitis  Diverticulitis is when small pouches in your colon (large intestine) get infected or swollen. This causes pain in the belly (abdomen) and watery poop (diarrhea). These pouches are called diverticula. The pouches form in people who have a condition called diverticulosis. What are the causes? This condition may be caused by poop (stool) that gets trapped in the pouches in your colon. The poop lets germs (bacteria) grow in the pouches. This causes the infection. What increases the risk? You are more likely to get this condition if you have small pouches in your colon. The risk is higher if:  You are overweight or very overweight (obese).  You do not exercise enough.  You drink alcohol.  You smoke or use products with tobacco in them.  You eat a diet that has a lot of red meat such as beef, pork, or lamb.  You eat a diet that does not have enough fiber in it.  You are older than 74 years of age. What are the signs or symptoms?  Pain in the belly. Pain is often on the left side, but it may be in other areas.  Fever and feeling cold.  Feeling like you may vomit.  Vomiting.  Having cramps.  Feeling full.  Changes to how often you poop.  Blood in your poop. How is this treated? Most cases are treated at home by:  Taking over-the-counter pain medicines.  Following a clear liquid diet.  Taking antibiotic medicines.  Resting. Very bad cases may need to be treated at a hospital. This may include:  Not eating or drinking.  Taking prescription pain medicine.  Getting antibiotic medicines through an IV tube.  Getting fluid and food through an IV tube.  Having surgery. When you are feeling better, your doctor may tell you to have a test to check your colon (colonoscopy). Follow these instructions at home: Medicines  Take over-the-counter and prescription medicines only as told by your doctor. These include: ? Antibiotics. ? Pain medicines. ? Fiber  pills. ? Probiotics. ? Stool softeners.  If you were prescribed an antibiotic medicine, take it as told by your doctor. Do not stop taking the antibiotic even if you start to feel better.  Ask your doctor if the medicine prescribed to you requires you to avoid driving or using machinery. Eating and drinking  Follow a diet as told by your doctor.  When you feel better, your doctor may tell you to change your diet. You may need to eat a lot of fiber. Fiber makes it easier to poop (have a bowel movement). Foods with fiber include: ? Berries. ? Beans. ? Lentils. ? Green vegetables.  Avoid eating red meat.   General instructions  Do not use any products that contain nicotine or tobacco, such as cigarettes, e-cigarettes, and chewing tobacco. If you need help quitting, ask your doctor.  Exercise 3 or more times a week. Try to get 30 minutes each time. Exercise enough to sweat and make your heart beat faster.  Keep all follow-up visits as told by your doctor. This is important. Contact a doctor if:  Your pain does not get better.  You are not pooping like normal. Get help right away if:  Your pain gets worse.  Your symptoms do not get better.  Your symptoms get worse very fast.  You have a fever.  You vomit more than one time.  You have poop that is: ? Bloody. ? Black. ? Tarry. Summary  This condition happens when   small pouches in your colon get infected or swollen.  Take medicines only as told by your doctor.  Follow a diet as told by your doctor.  Keep all follow-up visits as told by your doctor. This is important. This information is not intended to replace advice given to you by your health care provider. Make sure you discuss any questions you have with your health care provider. Document Revised: 09/21/2019 Document Reviewed: 09/21/2019 Elsevier Patient Education  2021 [employer] Inc[employer]  

## 2021-03-10 NOTE — Progress Notes (Signed)
This visit occurred during the SARS-CoV-2 public health emergency.  Safety protocols were in place, including screening questions prior to the visit, additional usage of staff PPE, and extensive cleaning of exam room while observing appropriate contact time as indicated for disinfecting solutions.    Shelly Leonard , 1946/11/08, 75 y.o., female MRN: [id number] Patient Care Team    Relationship Specialty Notifications Start End  [name], DO PCP - General Family Medicine  08/18/15   Warden Fillers, MD Consulting Physician Ophthalmology  05/08/16   Milus Banister, MD Attending Physician Gastroenterology  05/08/16   Thornell Sartorius, MD Consulting Physician Otolaryngology  06/25/17   Manson Passey, [facility]  Specialist  07/01/18   Madelin Rear, MD Consulting Physician Endocrinology  01/28/20     Chief Complaint  Patient presents with  . Abdominal Pain    Pt c/o left flank pain and LLQ pain radiating to back x 3 days denies n/v/d     Subjective: Pt presents for an OV with complaints of left abdominal pain.  She has a history of diverticulitis and has a flare about once a year.  She states this feels consistent with her prior diverticular flares.  She reports onset approximately 3 days ago.  She denies any nausea, vomit, bowel habit changes, blood per rectum, fevers or chills.  She has started a bland/liquid diet.  She denies any urinary changes.  Colonoscopy is up-to-date.  Depression screen [facility] 2/9 02/10/2021 08/09/2020 08/04/2019 02/05/2019 07/01/2018  Decreased Interest 0 0 0 0 0  Down, Depressed, Hopeless 0 1 0 0 0  PHQ - 2 Score 0 1 0 0 0  Altered sleeping - - - - -  Tired, decreased energy - - - - -  Change in appetite - - - - -  Feeling bad or failure about yourself  - - - - -  Trouble concentrating - - - - -  Moving slowly or fidgety/restless - - - - -  Suicidal thoughts - - - - -  PHQ-9 Score - - - - -  Difficult doing work/chores - - - - -    No Known Allergies Social  History   Social History Narrative   Ms Cabacungan is widowed (12/2016).    She enjoys music and is involved with her church.   Past Medical History:  Diagnosis Date  . Allergy   . Anemia    low iron (hgb 8)  . Arthritis    knees and hands   . Carpal tunnel syndrome   . Cataract   . Diverticulitis large intestine w/o perforation or abscess w/bleeding   . GERD (gastroesophageal reflux disease)   . Headache(784.0)    occasional   . Hypertension   . Lumbar spondylosis   . Osteopenia   . PONV (postoperative nausea and vomiting)   . Vertigo   . Vitamin D deficiency    Past Surgical History:  Procedure Laterality Date  . BREAST REDUCTION SURGERY  1982  . CARPAL TUNNEL RELEASE     left   . COLONOSCOPY    . FOOT SURGERY    . KNEE ARTHROSCOPY     right knee 2012   . OTHER SURGICAL HISTORY     spur removed top of left foot   . REDUCTION MAMMAPLASTY  1982  . TOTAL KNEE ARTHROPLASTY  04/21/2012   Procedure: TOTAL KNEE ARTHROPLASTY;  Surgeon: [name], MD;  Location: [facility];  Service: Orthopedics;  Laterality: Right;  .  VAGINAL HYSTERECTOMY  1977   Family History  Problem Relation Age of Onset  . Breast cancer Mother        late in life  . Diabetes Mother        late in life  . Heart disease Father   . Lung disease Father   . Colon cancer Neg Hx   . Esophageal cancer Neg Hx   . Stomach cancer Neg Hx   . Rectal cancer Neg Hx    Allergies as of 03/10/2021   No Known Allergies     Medication List       Accurate as of March 10, 2021  4:04 PM. If you have any questions, ask your nurse or doctor.        aspirin EC 81 MG tablet Take 81 mg by mouth daily.   fluticasone 50 MCG/ACT nasal spray Commonly known as: FLONASE Place 2 sprays into both nostrils daily.   Garlic 297 MG Tabs Take 100 mg by mouth daily with breakfast.   OMEGA 3 PO Take 1 capsule by mouth daily.   pantoprazole 20 MG tablet Commonly known as: PROTONIX Take 1 tablet (20 mg total) by mouth  daily as needed.   PROBIOTIC DAILY PO Take by mouth.   tizanidine 2 MG capsule Commonly known as: Zanaflex Take 1 capsule (2 mg total) by mouth 2 (two) times daily as needed for muscle spasms.   triamterene-hydrochlorothiazide 75-50 MG tablet Commonly known as: MAXZIDE Take 1 tablet by mouth daily.   verapamil 120 MG tablet Commonly known as: CALAN Take 1 tablet (120 mg total) by mouth 2 (two) times daily.   VITAMIN D PO Take by mouth.       All past medical history, surgical history, allergies, family history, immunizations andmedications were updated in the EMR today and reviewed under the history and medication portions of their EMR.     ROS: Negative, with the exception of above mentioned in HPI   Objective:  BP (!) 147/81   Pulse 69   Temp 98.1 F (36.7 C) (Oral)   Ht 5\' 3"  (1.6 m)   Wt 164 lb (74.4 kg)   SpO2 98%   BMI 29.05 kg/m  Body mass index is 29.05 kg/m. Gen: Afebrile. No acute distress. Nontoxic in appearance, well developed, well nourished.  HENT: AT. [place].  Eyes:Pupils Equal Round Reactive to light, Extraocular movements intact,  Conjunctiva without redness, discharge or icterus. Abd: Soft.  Flat.  Mild-moderate tenderness left mid abdomen ND. BS has not.  No masses palpated. No rebound or guarding.  MSK: No CVA tenderness bilaterally Neuro:  Normal gait. PERLA. EOMi. Alert. Oriented x3  Psych: Normal affect, dress and demeanor. Normal speech. Normal thought content and judgment.  No exam data present No results found. No results found for this or any previous visit (from the past 24 hour(s)).  Assessment/Plan: Shelly Leonard is a 75 y.o. female present for OV for  Diverticulitis Pain is consistent with diverticular flare. Start Augmentin twice daily x10 days Advance diet as tolerated Follow-up 1 week if not seeing improvement, sooner if worsening.   Reviewed expectations re: course of current medical issues.  Discussed self-management of  symptoms.  Outlined signs and symptoms indicating need for more acute intervention.  Patient verbalized understanding and all questions were answered.  Patient received an After-Visit Summary.    No orders of the defined types were placed in this encounter.  No orders of the defined types were placed in  this encounter.  Referral Orders  No referral(s) requested today     Note is dictated utilizing voice recognition software. Although note has been proof read prior to signing, occasional typographical errors still can be missed. If any questions arise, please do not hesitate to call for verification.   electronically signed by:  Howard Pouch, DO  [place]

## 2021-03-10 NOTE — Telephone Encounter (Signed)
Pt agreed to appt today at 4

## 2021-03-14 DIAGNOSIS — M76822 Posterior tibial tendinitis, left leg: Secondary | ICD-10-CM | POA: Diagnosis not present

## 2021-03-14 DIAGNOSIS — M67372 Transient synovitis, left ankle and foot: Secondary | ICD-10-CM | POA: Diagnosis not present

## 2021-03-14 DIAGNOSIS — M19072 Primary osteoarthritis, left ankle and foot: Secondary | ICD-10-CM | POA: Diagnosis not present

## 2021-04-28 ENCOUNTER — Other Ambulatory Visit: Payer: Self-pay

## 2021-04-28 ENCOUNTER — Encounter: Payer: Self-pay | Admitting: Family Medicine

## 2021-04-28 ENCOUNTER — Ambulatory Visit (INDEPENDENT_AMBULATORY_CARE_PROVIDER_SITE_OTHER): Payer: Medicare HMO | Admitting: Family Medicine

## 2021-04-28 VITALS — BP 130/84 | HR 65 | Temp 98.7°F | Resp 16 | Ht 63.0 in | Wt 159.2 lb

## 2021-04-28 DIAGNOSIS — M898X1 Other specified disorders of bone, shoulder: Secondary | ICD-10-CM

## 2021-04-28 DIAGNOSIS — M62838 Other muscle spasm: Secondary | ICD-10-CM

## 2021-04-28 DIAGNOSIS — M7918 Myalgia, other site: Secondary | ICD-10-CM

## 2021-04-28 MED ORDER — MELOXICAM 15 MG PO TABS: 15.0000 mg | ORAL_TABLET | Freq: Every day | ORAL | 0 refills | Status: DC

## 2021-04-28 MED ORDER — TIZANIDINE HCL 2 MG PO CAPS: 2.0000 mg | ORAL_CAPSULE | Freq: Two times a day (BID) | ORAL | 0 refills | Status: DC | PRN

## 2021-04-28 NOTE — Patient Instructions (Signed)
Call or make follow up appointment if not significantly improved in 7-10 days.

## 2021-04-28 NOTE — Progress Notes (Signed)
OFFICE VISIT  04/28/2021  CC:  Chief Complaint  Patient presents with  . Mid back pain    Under shoulder blade, x 1 week   HPI:    Patient is a 75 y.o. African-American female with obesity, lumbar spondylosis, and hx of low back pain who presents for "catch in back". Onset of pain around R scap in the morning 5-6 d/a.  Stays focal, not much movement anywhere.  Nothing makes it worse.  Using tylenol about bid lately and sleeping on electric blanket.  Has just a touch of R sided muscular neck pain on/off--bengay helps this.  No LB pain.   No known trigger/strain.  No recent trauma.  No SOB or CP.  No fevers or cough. Appetite is down.    Past Medical History:  Diagnosis Date  . Allergy   . Anemia    low iron (hgb 8)  . Arthritis    knees and hands   . Carpal tunnel syndrome   . Cataract   . Diverticulitis large intestine w/o perforation or abscess w/bleeding   . GERD (gastroesophageal reflux disease)   . Headache(784.0)    occasional   . Hypertension   . Lumbar spondylosis   . Osteopenia   . PONV (postoperative nausea and vomiting)   . Vertigo   . Vitamin D deficiency     Past Surgical History:  Procedure Laterality Date  . BREAST REDUCTION SURGERY  1982  . CARPAL TUNNEL RELEASE     left   . COLONOSCOPY    . FOOT SURGERY    . KNEE ARTHROSCOPY     right knee 2012   . OTHER SURGICAL HISTORY     spur removed top of left foot   . REDUCTION MAMMAPLASTY  1982  . TOTAL KNEE ARTHROPLASTY  04/21/2012   Procedure: TOTAL KNEE ARTHROPLASTY;  Surgeon: [name], MD;  Location: [facility];  Service: Orthopedics;  Laterality: Right;  [name] Kitchen VAGINAL HYSTERECTOMY  1977    Outpatient Medications Prior to Visit  Medication Sig Dispense Refill  . aspirin EC 81 MG tablet Take 81 mg by mouth daily.    . Cholecalciferol (VITAMIN D PO) Take by mouth.    . fluticasone (FLONASE) 50 MCG/ACT nasal spray Place 2 sprays into both nostrils daily. 16 g 5  . Garlic 194 MG TABS Take 100 mg by mouth  daily with breakfast.     . Omega-3 Fatty Acids (OMEGA 3 PO) Take 1 capsule by mouth daily.    . pantoprazole (PROTONIX) 20 MG tablet Take 1 tablet (20 mg total) by mouth daily as needed. 90 tablet 1  . Probiotic Product (PROBIOTIC DAILY PO) Take by mouth.    . triamterene-hydrochlorothiazide (MAXZIDE) 75-50 MG tablet Take 1 tablet by mouth daily. 90 tablet 1  . verapamil (CALAN) 120 MG tablet Take 1 tablet (120 mg total) by mouth 2 (two) times daily. 180 tablet 1  . tizanidine (ZANAFLEX) 2 MG capsule Take 1 capsule (2 mg total) by mouth 2 (two) times daily as needed for muscle spasms. 60 capsule 5  . amoxicillin-clavulanate (AUGMENTIN) 875-125 MG tablet Take 1 tablet by mouth 2 (two) times daily. (Patient not taking: Reported on 04/28/2021) 20 tablet 0   No facility-administered medications prior to visit.    No Known Allergies  ROS As per HPI  PE: Vitals with BMI 04/28/2021 03/10/2021 03/10/2021  Height 5\' 3"  - 5\' 3"   Weight 159 lbs 3 oz - 164 lbs  BMI 28.21 - 29.06  Systolic 295 188 416  Diastolic 84 81 85  Pulse 65 - 69    Exam chaperoned by Deveron Furlong, CMA.  Gen: Alert, well appearing.  Patient is oriented to person, place, time, and situation. AFFECT: pleasant, lucid thought and speech. CV: RRR, no m/r/g.   LUNGS: CTA bilat, nonlabored resps, good aeration in all lung fields. Back: no rash or abnormality of the skin. No neck, low back, or shoulder tenderness. She has tight musculature and significant tenderness in R mid back soft tissues over inferior 1/2 of scapula--about fist-sized area, no focal "trigger point" can be found.   LABS:    Chemistry      Component Value Date/Time   NA 137 02/10/2021 1336   NA 137 03/01/2015 0000   K 3.6 02/10/2021 1336   K 3.7 03/01/2015 0000   CL 102 02/10/2021 1336   CL 102 03/01/2015 0000   CO2 25 02/10/2021 1336   CO2 30 03/01/2015 0000   BUN 13 02/10/2021 1336   BUN 11 03/01/2015 0000   CREATININE 0.62 02/10/2021 1336       Component Value Date/Time   CALCIUM 10.3 02/10/2021 1336   CALCIUM 10.3 03/01/2015 0000   ALKPHOS 43 11/04/2019 1314   AST 19 02/10/2021 1336   AST 18 03/01/2015 0000   ALT 16 02/10/2021 1336   ALT 19 03/01/2015 0000   BILITOT 0.4 02/10/2021 1336      IMPRESSION AND PLAN:  1) Acute R scapula pain, myofascial tenderness and spasm present. Meloxicam 15mg  qd x 10d. Zanaflex 2mg  bid prn x 10d.   Therapeutic expectations and side effect profile of medications discussed today.  Patient's questions answered.  An After Visit Summary was printed and given to the patient.  FOLLOW UP: 7-10d if not feeling signif improvement  Signed:  Crissie Sickles, MD           04/28/2021

## 2021-05-02 DIAGNOSIS — H2513 Age-related nuclear cataract, bilateral: Secondary | ICD-10-CM | POA: Diagnosis not present

## 2021-06-23 ENCOUNTER — Other Ambulatory Visit: Payer: Self-pay

## 2021-06-23 ENCOUNTER — Encounter: Payer: Self-pay | Admitting: Family Medicine

## 2021-06-23 ENCOUNTER — Ambulatory Visit (INDEPENDENT_AMBULATORY_CARE_PROVIDER_SITE_OTHER): Payer: Medicare HMO | Admitting: Family Medicine

## 2021-06-23 VITALS — BP 108/72 | HR 72 | Temp 98.8°F | Wt 163.0 lb

## 2021-06-23 DIAGNOSIS — K5792 Diverticulitis of intestine, part unspecified, without perforation or abscess without bleeding: Secondary | ICD-10-CM | POA: Diagnosis not present

## 2021-06-23 MED ORDER — AMOXICILLIN-POT CLAVULANATE 875-125 MG PO TABS: 1.0000 | ORAL_TABLET | Freq: Two times a day (BID) | ORAL | 0 refills | Status: DC

## 2021-06-23 NOTE — Patient Instructions (Signed)
Diverticulitis  Diverticulitis is when small pouches in your colon (large intestine) get infected or swollen. This causes pain in the belly (abdomen) and watery poop (diarrhea). These pouches are called diverticula. The pouches form in people who have acondition called diverticulosis. What are the causes? This condition may be caused by poop (stool) that gets trapped in the pouches in your colon. The poop lets germs (bacteria) grow in the pouches. This causes the infection. What increases the risk? You are more likely to get this condition if you have small pouches in your colon. The risk is higher if: You are overweight or very overweight (obese). You do not exercise enough. You drink alcohol. You smoke or use products with tobacco in them. You eat a diet that has a lot of red meat such as beef, pork, or lamb. You eat a diet that does not have enough fiber in it. You are older than 74 years of age. What are the signs or symptoms? Pain in the belly. Pain is often on the left side, but it may be in other areas. Fever and feeling cold. Feeling like you may vomit. Vomiting. Having cramps. Feeling full. Changes to how often you poop. Blood in your poop. How is this treated? Most cases are treated at home by: Taking over-the-counter pain medicines. Following a clear liquid diet. Taking antibiotic medicines. Resting. Very bad cases may need to be treated at a hospital. This may include: Not eating or drinking. Taking prescription pain medicine. Getting antibiotic medicines through an IV tube. Getting fluid and food through an IV tube. Having surgery. When you are feeling better, your doctor may tell you to have a test to check your colon (colonoscopy). Follow these instructions at home: Medicines Take over-the-counter and prescription medicines only as told by your doctor. These include: Antibiotics. Pain medicines. Fiber pills. Probiotics. Stool softeners. If you were  prescribed an antibiotic medicine, take it as told by your doctor. Do not stop taking the antibiotic even if you start to feel better. Ask your doctor if the medicine prescribed to you requires you to avoid driving or using machinery. Eating and drinking  Follow a diet as told by your doctor. When you feel better, your doctor may tell you to change your diet. You may need to eat a lot of fiber. Fiber makes it easier to poop (have a bowel movement). Foods with fiber include: Berries. Beans. Lentils. Green vegetables. Avoid eating red meat.  General instructions Do not use any products that contain nicotine or tobacco, such as cigarettes, e-cigarettes, and chewing tobacco. If you need help quitting, ask your doctor. Exercise 3 or more times a week. Try to get 30 minutes each time. Exercise enough to sweat and make your heart beat faster. Keep all follow-up visits as told by your doctor. This is important. Contact a doctor if: Your pain does not get better. You are not pooping like normal. Get help right away if: Your pain gets worse. Your symptoms do not get better. Your symptoms get worse very fast. You have a fever. You vomit more than one time. You have poop that is: Bloody. Black. Tarry. Summary This condition happens when small pouches in your colon get infected or swollen. Take medicines only as told by your doctor. Follow a diet as told by your doctor. Keep all follow-up visits as told by your doctor. This is important. This information is not intended to replace advice given to you by your health care provider. Make sure  you discuss any questions you have with your healthcare provider. Document Revised: 09/21/2019 Document Reviewed: 09/21/2019 Elsevier Patient Education  2022 [employer].

## 2021-06-23 NOTE — Progress Notes (Signed)
This visit occurred during the SARS-CoV-2 public health emergency.  Safety protocols were in place, including screening questions prior to the visit, additional usage of staff PPE, and extensive cleaning of exam room while observing appropriate contact time as indicated for disinfecting solutions.    Shelly Leonard , 01/09/1946, 75 y.o., female MRN: [id number] Patient Care Team    Relationship Specialty Notifications Start End  [name], DO PCP - General Family Medicine  08/18/15   Warden Fillers, MD Consulting Physician Ophthalmology  05/08/16   Milus Banister, MD Attending Physician Gastroenterology  05/08/16   Thornell Sartorius, MD Consulting Physician Otolaryngology  06/25/17   Manson Passey, Emerge  Specialist  07/01/18   Madelin Rear, MD Consulting Physician Endocrinology  01/28/20     Chief Complaint  Patient presents with   Abdominal Pain    Pt c/o LLQ pain, abd pain, loose stools, x 2 days;      Subjective: Shelly Leonard is a 75 y.o. female present for abd pain. Pt presents for an OV with complaints of left lower abdominal pain 2 days duration.  She has a history of diverticulitis and has a flare about once a year, usually in June or July- however her last flare was 3 months ago.   She denies any nausea, vomit, blood per rectum, fevers or chills.  She has had a few loose stools over the last couple days.  She denies any urinary changes.  Colonoscopy is up-to-date 05/29/2016- Dr. Ardis Hughs.   Depression screen [facility] 2/9 02/10/2021 08/09/2020 08/04/2019 02/05/2019 07/01/2018  Decreased Interest 0 0 0 0 0  Down, Depressed, Hopeless 0 1 0 0 0  PHQ - 2 Score 0 1 0 0 0  Altered sleeping - - - - -  Tired, decreased energy - - - - -  Change in appetite - - - - -  Feeling bad or failure about yourself  - - - - -  Trouble concentrating - - - - -  Moving slowly or fidgety/restless - - - - -  Suicidal thoughts - - - - -  PHQ-9 Score - - - - -  Difficult doing work/chores - - - - -     No Known Allergies Social History   Social History Narrative   Ms Dickerman is widowed (12/2016).    She enjoys music and is involved with her church.   Past Medical History:  Diagnosis Date   Allergy    Anemia    low iron (hgb 8)   Arthritis    knees and hands    Carpal tunnel syndrome    Cataract    Diverticulitis large intestine w/o perforation or abscess w/bleeding    GERD (gastroesophageal reflux disease)    Headache(784.0)    occasional    Hypertension    Lumbar spondylosis    Osteopenia    PONV (postoperative nausea and vomiting)    Vertigo    Vitamin D deficiency    Past Surgical History:  Procedure Laterality Date   BREAST REDUCTION SURGERY  1982   CARPAL TUNNEL RELEASE     left    COLONOSCOPY     FOOT SURGERY     KNEE ARTHROSCOPY     right knee 2012    OTHER SURGICAL HISTORY     spur removed top of left foot    REDUCTION MAMMAPLASTY  1982   TOTAL KNEE ARTHROPLASTY  04/21/2012   Procedure: TOTAL KNEE ARTHROPLASTY;  Surgeon: Rodman Key  Marian Sorrow, MD;  Location: [facility];  Service: Orthopedics;  Laterality: Right;   VAGINAL HYSTERECTOMY  1977   Family History  Problem Relation Age of Onset   Breast cancer Mother        late in life   Diabetes Mother        late in life   Heart disease Father    Lung disease Father    Colon cancer Neg Hx    Esophageal cancer Neg Hx    Stomach cancer Neg Hx    Rectal cancer Neg Hx    Allergies as of 06/23/2021   No Known Allergies      Medication List        Accurate as of June 23, 2021 11:51 AM. If you have any questions, ask your nurse or doctor.          amoxicillin-clavulanate 875-125 MG tablet Commonly known as: AUGMENTIN Take 1 tablet by mouth 2 (two) times daily. Started by: Howard Pouch, DO   aspirin EC 81 MG tablet Take 81 mg by mouth daily.   fluticasone 50 MCG/ACT nasal spray Commonly known as: FLONASE Place 2 sprays into both nostrils daily.   Garlic 086 MG Tabs Take 100 mg by mouth daily with  breakfast.   meloxicam 15 MG tablet Commonly known as: MOBIC Take 1 tablet (15 mg total) by mouth daily.   OMEGA 3 PO Take 1 capsule by mouth daily.   pantoprazole 20 MG tablet Commonly known as: PROTONIX Take 1 tablet (20 mg total) by mouth daily as needed.   PROBIOTIC DAILY PO Take by mouth.   tizanidine 2 MG capsule Commonly known as: Zanaflex Take 1 capsule (2 mg total) by mouth 2 (two) times daily as needed for muscle spasms.   triamterene-hydrochlorothiazide 75-50 MG tablet Commonly known as: MAXZIDE Take 1 tablet by mouth daily.   verapamil 120 MG tablet Commonly known as: CALAN Take 1 tablet (120 mg total) by mouth 2 (two) times daily.   VITAMIN D PO Take by mouth.        All past medical history, surgical history, allergies, family history, immunizations andmedications were updated in the EMR today and reviewed under the history and medication portions of their EMR.     ROS: Negative, with the exception of above mentioned in HPI   Objective:  BP 108/72   Pulse 72   Temp 98.8 F (37.1 C) (Oral)   Wt 163 lb (73.9 kg)   SpO2 96%   BMI 28.87 kg/m  Body mass index is 28.87 kg/m. Gen: Afebrile. No acute distress. nontoxic HENT: AT. [place]. MMM Eyes:Pupils Equal Round Reactive to light, Extraocular movements intact,  Conjunctiva without redness, discharge or icterus. CV: RRR  Chest: CTAB, no wheeze or crackles Abd: Soft.  Round.  Tender left upper and lower quadrants today.  ND. BS present.  No masses palpated.  Skin: no rashes, purpura or petechiae.  Neuro:  Normal gait. PERLA. EOMi. Alert. Oriented x3 Psych: Normal affect, dress and demeanor. Normal speech. Normal thought content and judgment..    No results found. No results found. No results found for this or any previous visit (from the past 24 hour(s)).  Assessment/Plan: Shelly Leonard is a 75 y.o. female present for OV for  Diverticulitis Presumed recurrent diverticular flare today.  She has  responded well to Augmentin in the past and will prescribe Augmentin twice daily x10 days for her today. She is uncertain what is triggering her diverticular flares  and she will make note to see if it something dietary that she can change. She was provided AVS education on diverticulitis and preventing diverticulitis/diet.  We discussed these today. We also discussed referral back to gastroenterology if she continues to have recurrent flares.  She would like to wait on this today to see if she responds to the dietary changes first.  Reviewed expectations re: course of current medical issues. Discussed self-management of symptoms. Outlined signs and symptoms indicating need for more acute intervention. Patient verbalized understanding and all questions were answered. Patient received an After-Visit Summary.    No orders of the defined types were placed in this encounter.  Meds ordered this encounter  Medications   amoxicillin-clavulanate (AUGMENTIN) 875-125 MG tablet    Sig: Take 1 tablet by mouth 2 (two) times daily.    Dispense:  20 tablet    Refill:  0    Referral Orders  No referral(s) requested today     Note is dictated utilizing voice recognition software. Although note has been proof read prior to signing, occasional typographical errors still can be missed. If any questions arise, please do not hesitate to call for verification.   electronically signed by:  Howard Pouch, DO  [place]

## 2021-06-27 ENCOUNTER — Ambulatory Visit: Payer: Self-pay | Admitting: Allergy

## 2021-08-01 ENCOUNTER — Telehealth: Payer: Self-pay | Admitting: Family Medicine

## 2021-08-01 NOTE — Telephone Encounter (Signed)
Copied from [place] [id number]. Topic: Medicare AWV >> Aug 01, 2021  2:38 PM Harris-Coley, Hannah Beat wrote: Reason for CRM: Left message for patient to schedule Annual Wellness Visit.  Please schedule with Nurse Health Advisor Leroy Kennedy, RN at [facility].  Please call [phone] to schedule appt

## 2021-08-02 ENCOUNTER — Other Ambulatory Visit (HOSPITAL_BASED_OUTPATIENT_CLINIC_OR_DEPARTMENT_OTHER): Payer: Self-pay | Admitting: Family Medicine

## 2021-08-02 DIAGNOSIS — Z1231 Encounter for screening mammogram for malignant neoplasm of breast: Secondary | ICD-10-CM

## 2021-08-08 ENCOUNTER — Ambulatory Visit (HOSPITAL_BASED_OUTPATIENT_CLINIC_OR_DEPARTMENT_OTHER): Payer: Medicare HMO

## 2021-08-15 ENCOUNTER — Ambulatory Visit (INDEPENDENT_AMBULATORY_CARE_PROVIDER_SITE_OTHER): Payer: Medicare HMO | Admitting: Family Medicine

## 2021-08-15 ENCOUNTER — Encounter: Payer: Self-pay | Admitting: Family Medicine

## 2021-08-15 ENCOUNTER — Other Ambulatory Visit: Payer: Self-pay

## 2021-08-15 VITALS — BP 120/72 | HR 62 | Temp 98.0°F | Wt 163.0 lb

## 2021-08-15 DIAGNOSIS — K5792 Diverticulitis of intestine, part unspecified, without perforation or abscess without bleeding: Secondary | ICD-10-CM | POA: Diagnosis not present

## 2021-08-15 MED ORDER — MELOXICAM 15 MG PO TABS: 15.0000 mg | ORAL_TABLET | Freq: Every day | ORAL | 5 refills | Status: DC

## 2021-08-15 MED ORDER — AMOXICILLIN-POT CLAVULANATE 875-125 MG PO TABS: 1.0000 | ORAL_TABLET | Freq: Two times a day (BID) | ORAL | 0 refills | Status: DC

## 2021-08-15 NOTE — Patient Instructions (Signed)
Diverticulitis Diverticulitis is infection or inflammation of small pouches (diverticula) in the colon that form due to a condition called diverticulosis. Diverticula can trap stool (feces) and bacteria, causing infection and inflammation. Diverticulitis may cause severe stomach pain and diarrhea. It may lead to tissue damage in the colon that causes bleeding or blockage. The diverticula may also burst (rupture) and cause infected stool to enter other areas of the abdomen. What are the causes? This condition is caused by stool becoming trapped in the diverticula, which allows bacteria to grow in the diverticula. This leads to inflammation and infection. What increases the risk? You are more likely to develop this condition if you have diverticulosis. The risk increases if you: Are overweight or obese. Do not get enough exercise. Drink alcohol. Use tobacco products. Eat a diet that has a lot of red meat such as beef, pork, or lamb. Eat a diet that does not include enough fiber. High-fiber foods include fruits, vegetables, beans, nuts, and whole grains. Are over 40 years of age. What are the signs or symptoms? Symptoms of this condition may include: Pain and tenderness in the abdomen. The pain is normally located on the left side of the abdomen, but it may occur in other areas. Fever and chills. Nausea. Vomiting. Cramping. Bloating. Changes in bowel routines. Blood in your stool. How is this diagnosed? This condition is diagnosed based on: Your medical history. A physical exam. Tests to make sure there is nothing else causing your condition. These tests may include: Blood tests. Urine tests. CT scan of the abdomen. How is this treated? Most cases of this condition are mild and can be treated at home. Treatment may include: Taking over-the-counter pain medicines. Following a clear liquid diet. Taking antibiotic medicines by mouth. Resting. More severe cases may need to be treated  at a hospital. Treatment may include: Not eating or drinking. Taking prescription pain medicine. Receiving antibiotic medicines through an IV. Receiving fluids and nutrition through an IV. Surgery. When your condition is under control, your health care provider may recommend that you have a colonoscopy. This is an exam to look at the entire large intestine. During the exam, a lubricated, bendable tube is inserted into the anus and then passed into the rectum, colon, and other parts of the large intestine. A colonoscopy can show how severe your diverticula are and whether something else may be causing your symptoms. Follow these instructions at home: Medicines Take over-the-counter and prescription medicines only as told by your health care provider. These include fiber supplements, probiotics, and stool softeners. If you were prescribed an antibiotic medicine, take it as told by your health care provider. Do not stop taking the antibiotic even if you start to feel better. Ask your health care provider if the medicine prescribed to you requires you to avoid driving or using machinery. Eating and drinking  Follow a full liquid diet or another diet as directed by your health care provider. After your symptoms improve, your health care provider may tell you to change your diet. He or she may recommend that you eat a diet that contains at least 25 grams (25 g) of fiber daily. Fiber makes it easier to pass stool. Healthy sources of fiber include: Berries. One cup contains 4-8 grams of fiber. Beans or lentils. One-half cup contains 5-8 grams of fiber. Green vegetables. One cup contains 4 grams of fiber. Avoid eating red meat. General instructions Do not use any products that contain nicotine or tobacco, such as cigarettes,   e-cigarettes, and chewing tobacco. If you need help quitting, ask your health care provider. Exercise for at least 30 minutes, 3 times each week. You should exercise hard enough to  raise your heart rate and break a sweat. Keep all follow-up visits as told by your health care provider. This is important. You may need to have a colonoscopy. Contact a health care provider if: Your pain does not improve. Your bowel movements do not return to normal. Get help right away if: Your pain gets worse. Your symptoms do not get better with treatment. Your symptoms suddenly get worse. You have a fever. You vomit more than one time. You have stools that are bloody, black, or tarry. Summary Diverticulitis is infection or inflammation of small pouches (diverticula) in the colon that form due to a condition called diverticulosis. Diverticula can trap stool (feces) and bacteria, causing infection and inflammation. You are at higher risk for this condition if you have diverticulosis and you eat a diet that does not include enough fiber. Most cases of this condition are mild and can be treated at home. More severe cases may need to be treated at a hospital. When your condition is under control, your health care provider may recommend that you have an exam called a colonoscopy. This exam can show how severe your diverticula are and whether something else may be causing your symptoms. Keep all follow-up visits as told by your health care provider. This is important. This information is not intended to replace advice given to you by your health care provider. Make sure you discuss any questions you have with your health care provider. Document Revised: 09/21/2019 Document Reviewed: 09/21/2019 Elsevier Patient Education  2022 Elsevier Inc.  

## 2021-08-15 NOTE — Progress Notes (Signed)
This visit occurred during the SARS-CoV-2 public health emergency.  Safety protocols were in place, including screening questions prior to the visit, additional usage of staff PPE, and extensive cleaning of exam room while observing appropriate contact time as indicated for disinfecting solutions.    Shelly Leonard , 1946/08/25, 75 y.o., female MRN: [id number] Patient Care Team    Relationship Specialty Notifications Start End  [name], DO PCP - General Family Medicine  08/18/15   Warden Fillers, MD Consulting Physician Ophthalmology  05/08/16   Milus Banister, MD Attending Physician Gastroenterology  05/08/16   Thornell Sartorius, MD Consulting Physician Otolaryngology  06/25/17   Manson Passey, Emerge  Specialist  07/01/18   Madelin Rear, MD Consulting Physician Endocrinology  01/28/20     Chief Complaint  Patient presents with   Abdominal Pain    Pt c/o LLQ pain x 4 days; sx now improving     Subjective: Shelly Leonard is a 75 y.o. female present for nominal discomfort of 4 days in duration of her left lower quadrant area.  She stated is consistent with prior diverticulitis flares.  She has noted it started improving will splint.  She denies any fevers or chills.  She has been trying to monitor her diet closely especially around the times of diverticular flares to see if she is noticing a trigger.  She has stopped eating peanuts and popcorn secondary to potential triggers.  She notes feels that when she eats iceberg lettuce it causes her to have a flare.  She denies any changes in bowel habits or blood per rectum. Has a history of rather extensive diverticulosis throughout her colon via prior colonoscopy report and CT imaging.  Prior note: abd pain. Pt presents for an OV with complaints of left lower abdominal pain 2 days duration.  She has a history of diverticulitis and has a flare about once a year, usually in June or July- however her last flare was 3 months ago.   She  denies any nausea, vomit, blood per rectum, fevers or chills.  She has had a few loose stools over the last couple days.  She denies any urinary changes.  Colonoscopy is up-to-date 05/29/2016- Dr. Ardis Hughs.   Depression screen [facility] 2/9 02/10/2021 08/09/2020 08/04/2019 02/05/2019 07/01/2018  Decreased Interest 0 0 0 0 0  Down, Depressed, Hopeless 0 1 0 0 0  PHQ - 2 Score 0 1 0 0 0  Altered sleeping - - - - -  Tired, decreased energy - - - - -  Change in appetite - - - - -  Feeling bad or failure about yourself  - - - - -  Trouble concentrating - - - - -  Moving slowly or fidgety/restless - - - - -  Suicidal thoughts - - - - -  PHQ-9 Score - - - - -  Difficult doing work/chores - - - - -    No Known Allergies Social History   Social History Narrative   Ms Kiester is widowed (12/2016).    She enjoys music and is involved with her church.   Past Medical History:  Diagnosis Date   Allergy    Anemia    low iron (hgb 8)   Arthritis    knees and hands    Carpal tunnel syndrome    Cataract    Diverticulitis large intestine w/o perforation or abscess w/bleeding    GERD (gastroesophageal reflux disease)    Headache(784.0)  occasional    Hypertension    Lumbar spondylosis    Osteopenia    PONV (postoperative nausea and vomiting)    Vertigo    Vitamin D deficiency    Past Surgical History:  Procedure Laterality Date   BREAST REDUCTION SURGERY  1982   CARPAL TUNNEL RELEASE     left    COLONOSCOPY     FOOT SURGERY     KNEE ARTHROSCOPY     right knee 2012    OTHER SURGICAL HISTORY     spur removed top of left foot    REDUCTION MAMMAPLASTY  1982   TOTAL KNEE ARTHROPLASTY  04/21/2012   Procedure: TOTAL KNEE ARTHROPLASTY;  Surgeon: [name], MD;  Location: [facility];  Service: Orthopedics;  Laterality: Right;   VAGINAL HYSTERECTOMY  1977   Family History  Problem Relation Age of Onset   Breast cancer Mother        late in life   Diabetes Mother        late in life   Heart  disease Father    Lung disease Father    Colon cancer Neg Hx    Esophageal cancer Neg Hx    Stomach cancer Neg Hx    Rectal cancer Neg Hx    Allergies as of 08/15/2021   No Known Allergies      Medication List        Accurate as of August 15, 2021 11:12 AM. If you have any questions, ask your nurse or doctor.          amoxicillin-clavulanate 875-125 MG tablet Commonly known as: AUGMENTIN Take 1 tablet by mouth 2 (two) times daily.   aspirin EC 81 MG tablet Take 81 mg by mouth daily.   fluticasone 50 MCG/ACT nasal spray Commonly known as: FLONASE Place 2 sprays into both nostrils daily.   Garlic 123XX123 MG Tabs Take 100 mg by mouth daily with breakfast.   meloxicam 15 MG tablet Commonly known as: MOBIC Take 1 tablet (15 mg total) by mouth daily.   OMEGA 3 PO Take 1 capsule by mouth daily.   pantoprazole 20 MG tablet Commonly known as: PROTONIX Take 1 tablet (20 mg total) by mouth daily as needed.   PROBIOTIC DAILY PO Take by mouth.   tizanidine 2 MG capsule Commonly known as: Zanaflex Take 1 capsule (2 mg total) by mouth 2 (two) times daily as needed for muscle spasms.   triamterene-hydrochlorothiazide 75-50 MG tablet Commonly known as: MAXZIDE Take 1 tablet by mouth daily.   verapamil 120 MG tablet Commonly known as: CALAN Take 1 tablet (120 mg total) by mouth 2 (two) times daily.   VITAMIN D PO Take by mouth.        All past medical history, surgical history, allergies, family history, immunizations andmedications were updated in the EMR today and reviewed under the history and medication portions of their EMR.     ROS: Negative, with the exception of above mentioned in HPI   Objective:  BP 120/72   Pulse 62   Temp 98 F (36.7 C) (Oral)   Wt 163 lb (73.9 kg)   SpO2 98%   BMI 28.87 kg/m  Body mass index is 28.87 kg/m. Gen: Afebrile. No acute distress.  HENT: AT. [place].  Eyes:Pupils Equal Round Reactive to light, Extraocular movements  intact,  Conjunctiva without redness, discharge or icterus. CV: RRR  Abd: Soft. NTND. BS present. no Masses palpated.  Neuro: Normal gait. PERLA. EOMi.  Alert. Oriented x3 Psych: Normal affect, dress and demeanor. Normal speech. Normal thought content and judgment.    No results found. No results found. No results found for this or any previous visit (from the past 24 hour(s)).  Assessment/Plan: Shelly Leonard is a 75 y.o. female present for OV for  Diverticulitis Possibly mild diverticular flare, is currently improving without antibiotic therapy and dietary changes/hydration only. Prescribed prescription for Augmentin to start only if she rebounds within the next couple days with a full flare. She was provided AVS education on diverticulitis and preventing diverticulitis/diet.  We discussed these today-along with emergent signs and symptoms. She has an appointment with her gastroenterologist set up to discuss her more frequent diverticular flares, this being her third this year.  Reviewed expectations re: course of current medical issues. Discussed self-management of symptoms. Outlined signs and symptoms indicating need for more acute intervention. Patient verbalized understanding and all questions were answered. Patient received an After-Visit Summary.    No orders of the defined types were placed in this encounter.  No orders of the defined types were placed in this encounter.   Referral Orders  No referral(s) requested today     Note is dictated utilizing voice recognition software. Although note has been proof read prior to signing, occasional typographical errors still can be missed. If any questions arise, please do not hesitate to call for verification.   electronically signed by:  Howard Pouch, DO  [place]

## 2021-08-30 ENCOUNTER — Ambulatory Visit (INDEPENDENT_AMBULATORY_CARE_PROVIDER_SITE_OTHER): Payer: Medicare HMO | Admitting: *Deleted

## 2021-08-30 DIAGNOSIS — Z0000 Encounter for general adult medical examination without abnormal findings: Secondary | ICD-10-CM | POA: Diagnosis not present

## 2021-08-30 NOTE — Patient Instructions (Signed)
Shelly Leonard , Thank you for taking time to come for your Medicare Wellness Visit. I appreciate your ongoing commitment to your health goals. Please review the following plan we discussed and let me know if I can assist you in the future.   Screening recommendations/referrals: Colonoscopy: up to date Mammogram: up to date Bone Density: up to date Recommended yearly ophthalmology/optometry visit for glaucoma screening and checkup Recommended yearly dental visit for hygiene and checkup  Vaccinations: Influenza vaccine: Education provided Pneumococcal vaccine: up to date Tdap vaccine: Education provided Shingles vaccine: Education provided    Advanced directives: Education provided  Conditions/risks identified:      Preventive Care 74 Years and Older, Female Preventive care refers to lifestyle choices and visits with your health care provider that can promote health and wellness. What does preventive care include? A yearly physical exam. This is also called an annual well check. Dental exams once or twice a year. Routine eye exams. Ask your health care provider how often you should have your eyes checked. Personal lifestyle choices, including: Daily care of your teeth and gums. Regular physical activity. Eating a healthy diet. Avoiding tobacco and drug use. Limiting alcohol use. Practicing safe sex. Taking low-dose aspirin every day. Taking vitamin and mineral supplements as recommended by your health care provider. What happens during an annual well check? The services and screenings done by your health care provider during your annual well check will depend on your age, overall health, lifestyle risk factors, and family history of disease. Counseling  Your health care provider may ask you questions about your: Alcohol use. Tobacco use. Drug use. Emotional well-being. Home and relationship well-being. Sexual activity. Eating habits. History of falls. Memory and ability  to understand (cognition). Work and work Statistician. Reproductive health. Screening  You may have the following tests or measurements: Height, weight, and BMI. Blood pressure. Lipid and cholesterol levels. These may be checked every 5 years, or more frequently if you are over 30 years old. Skin check. Lung cancer screening. You may have this screening every year starting at age 74 if you have a 30-pack-year history of smoking and currently smoke or have quit within the past 15 years. Fecal occult blood test (FOBT) of the stool. You may have this test every year starting at age 74. Flexible sigmoidoscopy or colonoscopy. You may have a sigmoidoscopy every 5 years or a colonoscopy every 10 years starting at age 74. Hepatitis C blood test. Hepatitis B blood test. Sexually transmitted disease (STD) testing. Diabetes screening. This is done by checking your blood sugar (glucose) after you have not eaten for a while (fasting). You may have this done every 1-3 years. Bone density scan. This is done to screen for osteoporosis. You may have this done starting at age 74. Mammogram. This may be done every 1-2 years. Talk to your health care provider about how often you should have regular mammograms. Talk with your health care provider about your test results, treatment options, and if necessary, the need for more tests. Vaccines  Your health care provider may recommend certain vaccines, such as: Influenza vaccine. This is recommended every year. Tetanus, diphtheria, and acellular pertussis (Tdap, Td) vaccine. You may need a Td booster every 10 years. Zoster vaccine. You may need this after age 39. Pneumococcal 13-valent conjugate (PCV13) vaccine. One dose is recommended after age 35. Pneumococcal polysaccharide (PPSV23) vaccine. One dose is recommended after age 31. Talk to your health care provider about which screenings and vaccines you need  and how often you need them. This information is not  intended to replace advice given to you by your health care provider. Make sure you discuss any questions you have with your health care provider. Document Released: 01/06/2016 Document Revised: 08/29/2016 Document Reviewed: 10/11/2015 Elsevier Interactive Patient Education  2017 [employer] Prevention in the Home Falls can cause injuries. They can happen to people of all ages. There are many things you can do to make your home safe and to help prevent falls. What can I do on the outside of my home? Regularly fix the edges of walkways and driveways and fix any cracks. Remove anything that might make you trip as you walk through a door, such as a raised step or threshold. Trim any bushes or trees on the path to your home. Use bright outdoor lighting. Clear any walking paths of anything that might make someone trip, such as rocks or tools. Regularly check to see if handrails are loose or broken. Make sure that both sides of any steps have handrails. Any raised decks and porches should have guardrails on the edges. Have any leaves, snow, or ice cleared regularly. Use sand or salt on walking paths during winter. Clean up any spills in your garage right away. This includes oil or grease spills. What can I do in the bathroom? Use night lights. Install grab bars by the toilet and in the tub and shower. Do not use towel bars as grab bars. Use non-skid mats or decals in the tub or shower. If you need to sit down in the shower, use a plastic, non-slip stool. Keep the floor dry. Clean up any water that spills on the floor as soon as it happens. Remove soap buildup in the tub or shower regularly. Attach bath mats securely with double-sided non-slip rug tape. Do not have throw rugs and other things on the floor that can make you trip. What can I do in the bedroom? Use night lights. Make sure that you have a light by your bed that is easy to reach. Do not use any sheets or blankets that are  too big for your bed. They should not hang down onto the floor. Have a firm chair that has side arms. You can use this for support while you get dressed. Do not have throw rugs and other things on the floor that can make you trip. What can I do in the kitchen? Clean up any spills right away. Avoid walking on wet floors. Keep items that you use a lot in easy-to-reach places. If you need to reach something above you, use a strong step stool that has a grab bar. Keep electrical cords out of the way. Do not use floor polish or wax that makes floors slippery. If you must use wax, use non-skid floor wax. Do not have throw rugs and other things on the floor that can make you trip. What can I do with my stairs? Do not leave any items on the stairs. Make sure that there are handrails on both sides of the stairs and use them. Fix handrails that are broken or loose. Make sure that handrails are as long as the stairways. Check any carpeting to make sure that it is firmly attached to the stairs. Fix any carpet that is loose or worn. Avoid having throw rugs at the top or bottom of the stairs. If you do have throw rugs, attach them to the floor with carpet tape. Make sure that you  have a light switch at the top of the stairs and the bottom of the stairs. If you do not have them, ask someone to add them for you. What else can I do to help prevent falls? Wear shoes that: Do not have high heels. Have rubber bottoms. Are comfortable and fit you well. Are closed at the toe. Do not wear sandals. If you use a stepladder: Make sure that it is fully opened. Do not climb a closed stepladder. Make sure that both sides of the stepladder are locked into place. Ask someone to hold it for you, if possible. Clearly mark and make sure that you can see: Any grab bars or handrails. First and last steps. Where the edge of each step is. Use tools that help you move around (mobility aids) if they are needed. These  include: Canes. Walkers. Scooters. Crutches. Turn on the lights when you go into a dark area. Replace any light bulbs as soon as they burn out. Set up your furniture so you have a clear path. Avoid moving your furniture around. If any of your floors are uneven, fix them. If there are any pets around you, be aware of where they are. Review your medicines with your doctor. Some medicines can make you feel dizzy. This can increase your chance of falling. Ask your doctor what other things that you can do to help prevent falls. This information is not intended to replace advice given to you by your health care provider. Make sure you discuss any questions you have with your health care provider. Document Released: 10/06/2009 Document Revised: 05/17/2016 Document Reviewed: 01/14/2015 Elsevier Interactive Patient Education  2017 [employer].

## 2021-08-30 NOTE — Progress Notes (Signed)
Subjective:   Shelly Leonard is a 75 y.o. female who presents for Medicare Annual (Subsequent) preventive examination.  I connected with  Shelly Leonard on 08/30/21 by a telephone enabled telemedicine application and verified that I am speaking with the correct person using two identifiers.   I discussed the limitations of evaluation and management by telemedicine. The patient expressed understanding and agreed to proceed.   Review of Systems     Cardiac Risk Factors include: advanced age (>79mn, >>84women);hypertension;obesity (BMI >30kg/m2)     Objective:    Today's Vitals   There is no height or weight on file to calculate BMI.  Advanced Directives 08/30/2021 08/09/2020 08/04/2019 07/01/2018 06/25/2017 08/09/2016 05/29/2016  Does Patient Have a Medical Advance Directive? No No No No No No No  Would patient like information on creating a medical advance directive? No - Patient declined No - Patient declined No - Patient declined Yes (MAU/Ambulatory/Procedural Areas - Information given) Yes (MAU/Ambulatory/Procedural Areas - Information given) - -  Pre-existing out of facility DNR order (yellow form or pink MOST form) - - - - - - -    Current Medications (verified) Outpatient Encounter Medications as of 08/30/2021  Medication Sig   aspirin EC 81 MG tablet Take 81 mg by mouth daily.   Cholecalciferol (VITAMIN D PO) Take by mouth.   fluticasone (FLONASE) 50 MCG/ACT nasal spray Place 2 sprays into both nostrils daily.   Garlic 1123XX123MG TABS Take 100 mg by mouth daily with breakfast.    meloxicam (MOBIC) 15 MG tablet Take 1 tablet (15 mg total) by mouth daily.   Omega-3 Fatty Acids (OMEGA 3 PO) Take 1 capsule by mouth daily.   pantoprazole (PROTONIX) 20 MG tablet Take 1 tablet (20 mg total) by mouth daily as needed.   Probiotic Product (PROBIOTIC DAILY PO) Take by mouth.   tizanidine (ZANAFLEX) 2 MG capsule Take 1 capsule (2 mg total) by mouth 2 (two) times daily as needed for muscle  spasms.   triamterene-hydrochlorothiazide (MAXZIDE) 75-50 MG tablet Take 1 tablet by mouth daily.   verapamil (CALAN) 120 MG tablet Take 1 tablet (120 mg total) by mouth 2 (two) times daily.   amoxicillin-clavulanate (AUGMENTIN) 875-125 MG tablet Take 1 tablet by mouth 2 (two) times daily. (Patient not taking: Reported on 08/30/2021)   No facility-administered encounter medications on file as of 08/30/2021.    Allergies (verified) Patient has no known allergies.   History: Past Medical History:  Diagnosis Date   Allergy    Anemia    low iron (hgb 8)   Arthritis    knees and hands    Carpal tunnel syndrome    Cataract    Diverticulitis large intestine w/o perforation or abscess w/bleeding    GERD (gastroesophageal reflux disease)    Headache(784.0)    occasional    Hypertension    Lumbar spondylosis    Osteopenia    PONV (postoperative nausea and vomiting)    Vertigo    Vitamin D deficiency    Past Surgical History:  Procedure Laterality Date   BREAST REDUCTION SURGERY  1982   CARPAL TUNNEL RELEASE     left    COLONOSCOPY     FOOT SURGERY     KNEE ARTHROSCOPY     right knee 2012    OTHER SURGICAL HISTORY     spur removed top of left foot    REDUCTION MAMMAPLASTY  1982   TOTAL KNEE ARTHROPLASTY  04/21/2012  Procedure: TOTAL KNEE ARTHROPLASTY;  Surgeon: [name], MD;  Location: [facility];  Service: Orthopedics;  Laterality: Right;   VAGINAL HYSTERECTOMY  1977   Family History  Problem Relation Age of Onset   Breast cancer Mother        late in life   Diabetes Mother        late in life   Heart disease Father    Lung disease Father    Colon cancer Neg Hx    Esophageal cancer Neg Hx    Stomach cancer Neg Hx    Rectal cancer Neg Hx    Social History   Socioeconomic History   Marital status: Widowed    Spouse name: Not on file   Number of children: 2   Years of education: Not on file   Highest education level: Not on file  Occupational History   Not on  file  Tobacco Use   Smoking status: Never   Smokeless tobacco: Never  Vaping Use   Vaping Use: Never used  Substance and Sexual Activity   Alcohol use: No   Drug use: No   Sexual activity: Yes    Birth control/protection: Post-menopausal  Other Topics Concern   Not on file  Social History Narrative   [name] is widowed (12/2016).    She enjoys music and is involved with her church.   Social Determinants of Health   Financial Resource Strain: Low Risk    Difficulty of Paying Living Expenses: Not hard at all  Food Insecurity: No Food Insecurity   Worried About Charity fundraiser in the Last Year: Never true   [place] in the Last Year: Never true  Transportation Needs: No Transportation Needs   Lack of Transportation (Medical): No   Lack of Transportation (Non-Medical): No  Physical Activity: Unknown   Days of Exercise per Week: 5 days   Minutes of Exercise per Session: Not on file  Stress: No Stress Concern Present   Feeling of Stress : Not at all  Social Connections: Moderately Integrated   Frequency of Communication with Friends and Family: More than three times a week   Frequency of Social Gatherings with Friends and Family: Three times a week   Attends Religious Services: More than 4 times per year   Active Member of Clubs or Organizations: Yes   Attends Archivist Meetings: More than 4 times per year   Marital Status: Widowed    Tobacco Counseling Counseling given: Not Answered   Clinical Intake:  Pre-visit preparation completed: Yes  Pain : No/denies pain     Nutritional Risks: None Diabetes: No  How often do you need to have someone help you when you read instructions, pamphlets, or other written materials from your doctor or pharmacy?: 1 - Never  Diabetic? no  Interpreter Needed?: No  Information entered by :: Leroy Kennedy LPN   Activities of Daily Living In your present state of health, do you have any difficulty performing  the following activities: 08/30/2021  Hearing? N  Vision? N  Difficulty concentrating or making decisions? N  Walking or climbing stairs? N  Dressing or bathing? N  Doing errands, shopping? N  Preparing Food and eating ? N  Using the Toilet? N  In the past six months, have you accidently leaked urine? N  Do you have problems with loss of bowel control? N  Managing your Medications? N  Managing your Finances? N  Housekeeping or managing  your Housekeeping? N  Some recent data might be hidden    Patient Care Team: [name], DO as PCP - General (Family Medicine) Warden Fillers, MD as Consulting Physician (Ophthalmology) Milus Banister, MD as Attending Physician (Gastroenterology) Thornell Sartorius, MD as Consulting Physician (Otolaryngology) Manson Passey, Emerge (Specialist) Madelin Rear, MD as Consulting Physician (Endocrinology)  Indicate any recent Medical Services you may have received from other than [facility] providers in the past year (date may be approximate).     Assessment:   This is a routine wellness examination for Bouse.  Hearing/Vision screen Hearing Screening - Comments:: No trouble hearing  Dietary issues and exercise activities discussed: Current Exercise Habits: Home exercise routine, Type of exercise: strength training/weights (bowling league), Time (Minutes): 25, Frequency (Times/Week): 5, Weekly Exercise (Minutes/Week): 125, Intensity: Moderate   Goals Addressed             This Visit's Progress    Patient Stated       Would like to loose weight        Depression Screen PHQ 2/9 Scores 08/30/2021 02/10/2021 08/09/2020 08/04/2019 02/05/2019 07/01/2018 03/14/2018  PHQ - 2 Score 0 0 1 0 0 0 0  PHQ- 9 Score - - - - - - 0    Fall Risk Fall Risk  02/10/2021 08/09/2020 08/04/2019 02/05/2019 07/01/2018  Falls in the past year? 0 0 0 0 No  Number falls in past yr: 0 0 0 0 -  Injury with Fall? 0 0 0 0 -  Follow up - Falls evaluation completed Falls prevention  discussed Falls evaluation completed -    FALL RISK PREVENTION PERTAINING TO THE HOME:  Any stairs in or around the home? Yes  If so, are there any without handrails? No  Home free of loose throw rugs in walkways, pet beds, electrical cords, etc? Yes  Adequate lighting in your home to reduce risk of falls? Yes   ASSISTIVE DEVICES UTILIZED TO PREVENT FALLS:  Life alert? No  Use of a cane, walker or w/c? No  Grab bars in the bathroom? Yes  Shower chair or bench in shower? Yes  Elevated toilet seat or a handicapped toilet? Yes   TIMED UP AND GO:  Was the test performed? No .    Cognitive Function:  Normal cognitive status assessed by direct observation by this Nurse Health Advisor. No abnormalities found.     MMSE - Mini Mental State Exam 08/04/2019 07/01/2018  Orientation to time 5 5  Orientation to Place 5 5  Registration 3 3  Attention/ Calculation 5 5  Recall 3 3  Language- name 2 objects 2 2  Language- repeat 1 1  Language- follow 3 step command 3 3  Language- read & follow direction 1 1  Write a sentence 1 1  Copy design 1 1  Total score 30 30        Immunizations Immunization History  Administered Date(s) Administered   PFIZER(Purple Top)SARS-COV-2 Vaccination 03/17/2020, 04/12/2020, 11/30/2020   Pneumococcal Conjugate-13 04/01/2014   Pneumococcal Polysaccharide-23 11/29/2015   Td 08/14/2011   Zoster, Live 01/20/2013    TDAP status: Due, Education has been provided regarding the importance of this vaccine. Advised may receive this vaccine at local pharmacy or Health Dept. Aware to provide a copy of the vaccination record if obtained from local pharmacy or Health Dept. Verbalized acceptance and understanding.  Flu Vaccine status: Due, Education has been provided regarding the importance of this vaccine. Advised may receive this vaccine at  local pharmacy or Health Dept. Aware to provide a copy of the vaccination record if obtained from local pharmacy or Health  Dept. Verbalized acceptance and understanding.  Pneumococcal vaccine status: Up to date  Covid-19 vaccine status: Information provided on how to obtain vaccines.   Qualifies for Shingles Vaccine? Yes   Zostavax completed Yes   Shingrix Completed?: No.    Education has been provided regarding the importance of this vaccine. Patient has been advised to call insurance company to determine out of pocket expense if they have not yet received this vaccine. Advised may also receive vaccine at local pharmacy or Health Dept. Verbalized acceptance and understanding.  Screening Tests Health Maintenance  Topic Date Due   Zoster Vaccines- Shingrix (1 of 2) Never done   MAMMOGRAM  08/02/2021   COVID-19 Vaccine (4 - Booster for Pfizer series) 08/31/2021 (Originally 02/28/2021)   INFLUENZA VACCINE  03/23/2022 (Originally 07/24/2021)   TETANUS/TDAP  08/15/2022 (Originally 08/13/2021)   DEXA SCAN  08/03/2023   COLONOSCOPY (Pts 45-40yr Insurance coverage will need to be confirmed)  05/29/2026   Hepatitis C Screening  Completed   PNA vac Low Risk Adult  Addressed   HPV VACCINES  Aged Out    Health Maintenance  Health Maintenance Due  Topic Date Due   Zoster Vaccines- Shingrix (1 of 2) Never done   MAMMOGRAM  08/02/2021    Colorectal cancer screening: Type of screening: Colonoscopy. Completed 2017. Repeat every 10 years  Mammogram status: Completed  . Repeat every year  Bone Density status: Completed 2021. Results reflect: Bone density results: OSTEOPOROSIS. Repeat every 2 years.  Lung Cancer Screening: (Low Dose CT Chest recommended if Age 74-80years, 30 pack-year currently smoking OR have quit w/in 15years.) does not qualify.   Lung Cancer Screening Referral:   Additional Screening:  Hepatitis C Screening: does not qualify; Completed   Vision Screening: Recommended annual ophthalmology exams for early detection of glaucoma and other disorders of the eye. Is the patient up to date with their  annual eye exam?  Yes  Who is the provider or what is the name of the office in which the patient attends annual eye exams? SLetta MoynahanIf pt is not established with a provider, would they like to be referred to a provider to establish care? No .   Dental Screening: Recommended annual dental exams for proper oral hygiene  Community Resource Referral / Chronic Care Management: CRR required this visit?  No   CCM required this visit?  No      Plan:     I have personally reviewed and noted the following in the patient's chart:   Medical and social history Use of alcohol, tobacco or illicit drugs  Current medications and supplements including opioid prescriptions.  Functional ability and status Nutritional status Physical activity Advanced directives List of other physicians Hospitalizations, surgeries, and ER visits in previous 12 months Vitals Screenings to include cognitive, depression, and falls Referrals and appointments  In addition, I have reviewed and discussed with patient certain preventive protocols, quality metrics, and best practice recommendations. A written personalized care plan for preventive services as well as general preventive health recommendations were provided to patient.     JLeroy Kennedy LPN   [id number]  Nurse Notes:

## 2021-09-05 ENCOUNTER — Telehealth: Payer: Self-pay | Admitting: Gastroenterology

## 2021-09-05 ENCOUNTER — Encounter (HOSPITAL_BASED_OUTPATIENT_CLINIC_OR_DEPARTMENT_OTHER): Payer: Self-pay

## 2021-09-05 ENCOUNTER — Other Ambulatory Visit: Payer: Self-pay

## 2021-09-05 ENCOUNTER — Ambulatory Visit (HOSPITAL_BASED_OUTPATIENT_CLINIC_OR_DEPARTMENT_OTHER)
Admission: RE | Admit: 2021-09-05 | Discharge: 2021-09-05 | Disposition: A | Discharge status: Home / Self Care | Payer: Medicare HMO | Source: Ambulatory Visit | Attending: Family Medicine | Admitting: Family Medicine

## 2021-09-05 DIAGNOSIS — Z1231 Encounter for screening mammogram for malignant neoplasm of breast: Secondary | ICD-10-CM

## 2021-09-05 NOTE — Telephone Encounter (Signed)
Hey Dr Ardis Hughs this former pt of yours is wishing to switch care to Dr Lyndel Safe in [facility] since he is closer to her home, would this transfer be ok with you?

## 2021-09-05 NOTE — Telephone Encounter (Signed)
That is okay with me if it is okay with Dr. Lyndel Leonard

## 2021-09-11 NOTE — Telephone Encounter (Signed)
Hey Dr. Lyndel Safe,   Patient is requesting to switch providers due to [facility] being closer to her. Dr. Ardis Hughs have agreed. Could you please advise?  Thank you

## 2021-09-14 ENCOUNTER — Encounter: Payer: Self-pay | Admitting: Nurse Practitioner

## 2021-09-14 ENCOUNTER — Ambulatory Visit: Payer: Medicare HMO | Admitting: Nurse Practitioner

## 2021-09-14 VITALS — BP 126/70 | HR 76 | Ht 63.0 in | Wt 162.4 lb

## 2021-09-14 DIAGNOSIS — Z8719 Personal history of other diseases of the digestive system: Secondary | ICD-10-CM

## 2021-09-14 DIAGNOSIS — R1032 Left lower quadrant pain: Secondary | ICD-10-CM | POA: Diagnosis not present

## 2021-09-14 NOTE — Patient Instructions (Signed)
If you are age 74 or older, your body mass index should be between 23-30. Your Body mass index is 28.76 kg/m. If this is out of the aforementioned range listed, please consider follow up with your Primary Care Provider.  It has been recommended to you by your physician that you have a(n) Colonoscopy completed. Per your request, we did not schedule the procedure(s) today. Please contact our office at [phone] should you decide to have the procedure completed. You will be scheduled for a pre-visit and procedure at that time.   Call our office if lower abdominal pain recurs.  It was great seeing you today! Thank you for entrusting me with your care and choosing [facility].  Noralyn Pick, CRNP

## 2021-09-14 NOTE — Progress Notes (Signed)
09/14/2021 Shelly Leonard [id number] [birth year]   CHIEF COMPLAINT: Recurrent diverticulitis   HISTORY OF PRESENT ILLNESS:  Shelly Leonard is a 75 year old female with a past medical history of hypertension, iron deficiency anemia, arthritis, carpal tunnel syndrome, lumbar spondylosis and diverticulitis.  She presents to our office today as referred by Dr. Howard Pouch for further evaluation regarding recurrent diverticulitis.  She has a history of sigmoid diverticulitis confirmed by CT AP 02/13/2015 resolved after she took a course of Cipro and Flagyl.  She reports having at least 3 flares of LLQ abdominal pain, presumed diverticulitis over the past year.  She was seen by Dr. Raoul Pitch 03/10/2021, 06/23/2021 and 08/15/2021 due to having several days of LLQ pain assessed to have diverticulitis and she was treated with Augmentin 875 mg 1 p.o. twice daily for 10 days following each visit.  Her LLQ pain typically resolves within 3 to 4 days after taking the Augmentin.  She often eats nuts which she thought may be triggering her diverticulitis flares.  She is now avoiding nuts, popcorn and seeds.  She denies having any abdominal pain at this time.  No constipation.  She typically passes 1 or 2 normal formed brown bowel movements daily.  No rectal bleeding or black stools.  Her most recent colonoscopy by Dr. Ardis Hughs was 05/29/2016 which identified a 3 mm polyp removed from the cecum (biopsy showed benign colonic mucosa) and diverticulosis throughout the entire examined colon.  No known family history of colorectal cancer.  She denies having any GERD symptoms at this time, previously took Pantoprazole 20 as needed.  CBC Latest Ref Rng & Units 02/10/2021 08/09/2020 11/04/2019  WBC 3.8 - 10.8 Thousand/uL 4.4 6.5 5.9  Hemoglobin 11.7 - 15.5 g/dL 13.8 14.0 12.5  Hematocrit 35.0 - 45.0 % 41.5 43.0 38.1  Platelets 140 - 400 Thousand/uL 272 283.0 287.0    CMP Latest Ref Rng & Units 02/10/2021 08/09/2020 11/04/2019   Glucose 65 - 99 mg/dL 81 88 -  BUN 7 - 25 mg/dL 13 11 -  Creatinine 0.60 - 0.93 mg/dL 0.62 0.53 -  Sodium 135 - 146 mmol/L 137 134(L) -  Potassium 3.5 - 5.3 mmol/L 3.6 3.5 -  Chloride 98 - 110 mmol/L 102 99 -  CO2 20 - 32 mmol/L 25 28 -  Calcium 8.6 - 10.4 mg/dL 10.3 10.7(H) 10.7(H)  Total Protein 6.1 - 8.1 g/dL 6.9 - -  Total Bilirubin 0.2 - 1.2 mg/dL 0.4 - -  Alkaline Phos 39 - 117 U/L - - -  AST 10 - 35 U/L 19 - -  ALT 6 - 29 U/L 16 - -    Colonoscopy by Dr. Ardis Hughs 05/29/2016: - One 3 mm polyp in the cecum, removed with a cold snare. Resected and retrieved. - Diverticulosis in the entire examined colon. - The examination was otherwise normal on direct and retroflexion views. - BENIGN CECUM MUCOSA   CTAP 02/13/2015: 1. Acute diverticulitis. No free perforation or abscess formation identified. 2. Liver cysts. 3. Mild periportal edema of uncertain significance.   Past Medical History:  Diagnosis Date   Allergy    Anemia    low iron (hgb 8)   Arthritis    knees and hands    Carpal tunnel syndrome    Cataract    Diverticulitis large intestine w/o perforation or abscess w/bleeding    GERD (gastroesophageal reflux disease)    Headache(784.0)    occasional    Hypertension  Lumbar spondylosis    Osteopenia    PONV (postoperative nausea and vomiting)    Vertigo    Vitamin D deficiency    Past Surgical History:  Procedure Laterality Date   BREAST REDUCTION SURGERY  1982   CARPAL TUNNEL RELEASE     left    COLONOSCOPY     FOOT SURGERY     KNEE ARTHROSCOPY     right knee 2012    OTHER SURGICAL HISTORY     spur removed top of left foot    REDUCTION MAMMAPLASTY  1982   TOTAL KNEE ARTHROPLASTY  04/21/2012   Procedure: TOTAL KNEE ARTHROPLASTY;  Surgeon: [name], MD;  Location: [facility];  Service: Orthopedics;  Laterality: Right;   VAGINAL HYSTERECTOMY  1977   Social History: She is widowed.  She has 1 son and 1 daughter.  She is retired.  Non-smoker.  No alcohol  use.  No drug use.  Family History: Mother with history of diabetes and breast cancer.  Father with history of heart and lung disease.  No known family history of esophageal, gastric or colon cancer.  No Known Allergies   Outpatient Encounter Medications as of 09/14/2021  Medication Sig   aspirin EC 81 MG tablet Take 81 mg by mouth daily.   Cholecalciferol (VITAMIN D PO) Take by mouth.   fluticasone (FLONASE) 50 MCG/ACT nasal spray Place 2 sprays into both nostrils daily.   Garlic 329 MG TABS Take 100 mg by mouth daily with breakfast.    meloxicam (MOBIC) 15 MG tablet Take 1 tablet (15 mg total) by mouth daily.   Omega-3 Fatty Acids (OMEGA 3 PO) Take 1 capsule by mouth daily.   pantoprazole (PROTONIX) 20 MG tablet Take 1 tablet (20 mg total) by mouth daily as needed.   Probiotic Product (PROBIOTIC DAILY PO) Take by mouth.   tizanidine (ZANAFLEX) 2 MG capsule Take 1 capsule (2 mg total) by mouth 2 (two) times daily as needed for muscle spasms.   triamterene-hydrochlorothiazide (MAXZIDE) 75-50 MG tablet Take 1 tablet by mouth daily.   verapamil (CALAN) 120 MG tablet Take 1 tablet (120 mg total) by mouth 2 (two) times daily.   [DISCONTINUED] amoxicillin-clavulanate (AUGMENTIN) 875-125 MG tablet Take 1 tablet by mouth 2 (two) times daily. (Patient not taking: Reported on 08/30/2021)   No facility-administered encounter medications on file as of 09/14/2021.   REVIEW OF SYSTEMS:   Gen: Denies fever, sweats or chills. No weight loss.  CV: Denies chest pain, palpitations or edema. Resp: Denies cough, shortness of breath of hemoptysis.  GI: See HPI.   GU : Denies urinary burning, blood in urine, increased urinary frequency or incontinence. MS: + left back pain, sciatica. Derm: Denies rash, itchiness, skin lesions or unhealing ulcers. Psych: Denies depression, anxiety or memory loss. Heme: Denies bruising, bleeding. Neuro:  Denies headaches, dizziness or paresthesias. Endo:  Denies any problems  with DM, thyroid or adrenal function.  PHYSICAL EXAM: BP 126/70   Pulse 76   Ht 5\' 3"  (1.6 m)   Wt 162 lb 6 oz (73.7 kg)   BMI 28.76 kg/m  General: 75 year old female in no acute distress. Head: Normocephalic and atraumatic. Eyes:  Sclerae non-icteric, conjunctive pink. Ears: Normal auditory acuity. Mouth: Lower bridge. No ulcers or lesions.  Neck: Supple, no lymphadenopathy or thyromegaly.  Lungs: Clear bilaterally to auscultation without wheezes, crackles or rhonchi. Heart: Regular rate and rhythm. No murmur, rub or gallop appreciated.  Abdomen: Soft, nontender, non distended.  No masses. No hepatosplenomegaly. Normoactive bowel sounds x 4 quadrants.  Rectal: Deferred. Musculoskeletal: Symmetrical with no gross deformities. Skin: Warm and dry. No rash or lesions on visible extremities. Extremities: No edema. Neurological: Alert oriented x 4, no focal deficits.  Psychological:  Alert and cooperative. Normal mood and affect.  ASSESSMENT AND PLAN:  60) 75 year old female with a history of sigmoid diverticulitis confirmed by CTAP in 2016 with recurrent LLQ pain x 3 episodes March, July and August 2022 which resolved after Augmentin twice daily for 10 days.  Colonoscopy 05/2016 showed diverticulosis throughout the colon, 1 polyp was removed from the cecum but biopsy showed benign colonic mucosa and not a true polyp. -I discussed with the patient due to her presumed recurrent sigmoid diverticulitis he diagnostic colonoscopy was recommended to rule out any inflammatory or malignant process which could potentially trigger diverticulitis -Colonoscopy benefits and risks discussed including risk with sedation, risk of bleeding, perforation and infection  -She will call our office when she is ready to schedule a colonoscopy with Dr. Lyndel Safe (see note below).  She needs to check with her family members to see who can drive her to the endoscopy center and back home on the day of her procedure -She was  advised to call our office if her LLQ pain recurs, if her LLQ pain recurs I would recommend CBC, CMP and CTAP with oral and IV contrast if she has normal renal function -Note prior phone note to Dr. Ardis Hughs requested to  transfer her GI management to Dr. Lyndel Safe whose office is in [facility] which is closer to her home       CC:  Kuneff, Micro, DO

## 2021-09-18 NOTE — Progress Notes (Signed)
Follow Up Note  RE: Shelly Leonard MRN: [id number] DOB: 01/26/1946 Date of Office Visit: 09/19/2021  Referring provider: [name], DO Primary care provider: [name], DO  Chief Complaint: Follow-up (Pt reports that still having trouble blowing her nose, it feels dry, she's experiencing sinus pressure. Other than that she is doing well.), Sinus Problem, and Sinusitis  History of Present Illness: I had the pleasure of seeing Shelly Leonard for a follow up visit at the [facility] on 09/19/2021. She is a 75 y.o. female, who is being followed for chronic sinusitis, anosmia, GERD. Her previous allergy office visit was on 09/08/2019 with Dr. Verlin Fester. Today is a regular follow up visit.  Patient has been having nasal congestion, anosmia. No change in symptoms for the past 2 years - she has been just dealing with it.   She is using Flonase 2 sprays per nostril daily with some benefit. No nosebleeds. Patient took prednisone and can't recall if they helped or not.   No prior ENT evaluation. 1 sinus infection in January 2022. Interested in getting skin testing done now.   Assessment and Plan: Shelly Leonard is a 75 y.o. female with: Chronic rhinitis Nasal congestion and anosmia unchanged for the past 2 years.  Taking Flonase 2 sprays per nostril once a day with some benefit.  She cannot recall if the prednisone helped 2 years ago.  No prior ENT evaluation.  No prior allergy testing. 1 sinus infection in 2022.   Return for environmental allergy testing - if negative then will refer to ENT next.  Stop Flonase. Start Xhance (fluticasone) nasal spray 1 spray per nostril twice a day as needed for nasal congestion.  Sample given and demonstrated proper use. If this is not covered let us know.  Use saline nasal spray beforehand as needed. Declines nettipot.   Anosmia See assessment and plan as above.  Return for Skin testing.  Meds ordered this encounter   Medications   Fluticasone Propionate (XHANCE) 93 MCG/ACT EXHU    Sig: Place 1 spray into the nose in the morning and at bedtime.    Dispense:  16 mL    Refill:  5    P: [phone]    Lab Orders  No laboratory test(s) ordered today    Diagnostics: None.  Medication List:  Current Outpatient Medications  Medication Sig Dispense Refill   aspirin EC 81 MG tablet Take 81 mg by mouth daily.     Cholecalciferol (VITAMIN D PO) Take by mouth.     Fluticasone Propionate (XHANCE) 93 MCG/ACT EXHU Place 1 spray into the nose in the morning and at bedtime. 16 mL 5   Garlic 378 MG TABS Take 100 mg by mouth daily with breakfast.      meloxicam (MOBIC) 15 MG tablet Take 1 tablet (15 mg total) by mouth daily. 30 tablet 5   Omega-3 Fatty Acids (OMEGA 3 PO) Take 1 capsule by mouth daily.     pantoprazole (PROTONIX) 20 MG tablet Take 1 tablet (20 mg total) by mouth daily as needed. 90 tablet 1   Probiotic Product (PROBIOTIC DAILY PO) Take by mouth.     tizanidine (ZANAFLEX) 2 MG capsule Take 1 capsule (2 mg total) by mouth 2 (two) times daily as needed for muscle spasms. 20 capsule 0   triamterene-hydrochlorothiazide (MAXZIDE) 75-50 MG tablet Take 1 tablet by mouth daily. 90 tablet 1   verapamil (CALAN) 120 MG tablet Take 1 tablet (120 mg  total) by mouth 2 (two) times daily. 180 tablet 1   No current facility-administered medications for this visit.   Allergies: No Known Allergies I reviewed her past medical history, social history, family history, and environmental history and no significant changes have been reported from her previous visit.  Review of Systems  Constitutional:  Negative for appetite change, chills, fever and unexpected weight change.  HENT:  Positive for congestion. Negative for rhinorrhea.   Eyes:  Negative for itching.  Respiratory:  Negative for cough, chest tightness, shortness of breath and wheezing.   Gastrointestinal:  Negative for abdominal pain.  Skin:  Negative  for rash.  Neurological:  Negative for headaches.   Objective: BP 114/76   Pulse 67   Temp 98 F (36.7 C) (Temporal)   Resp 18   Ht 5\' 1"  (1.549 m)   Wt 163 lb 1.9 oz (74 kg)   SpO2 98%   BMI 30.82 kg/m  Body mass index is 30.82 kg/m. Physical Exam Vitals and nursing note reviewed.  Constitutional:      Appearance: Normal appearance. She is well-developed.  HENT:     Head: Normocephalic and atraumatic.     Right Ear: Tympanic membrane and external ear normal.     Left Ear: Tympanic membrane and external ear normal.     Nose: Congestion present.     Mouth/Throat:     Mouth: Mucous membranes are moist.     Pharynx: Oropharynx is clear.  Eyes:     Conjunctiva/sclera: Conjunctivae normal.  Cardiovascular:     Rate and Rhythm: Normal rate and regular rhythm.     Heart sounds: Normal heart sounds. No murmur heard. Pulmonary:     Effort: Pulmonary effort is normal.     Breath sounds: Normal breath sounds. No wheezing, rhonchi or rales.  Musculoskeletal:     Cervical back: Neck supple.  Skin:    General: Skin is warm.     Findings: No rash.  Neurological:     Mental Status: She is alert and oriented to person, place, and time.  Psychiatric:        Behavior: Behavior normal.   Previous notes and tests were reviewed. The plan was reviewed with the patient/family, and all questions/concerned were addressed.  It was my pleasure to see Shelly Leonard today and participate in her care. Please feel free to contact me with any questions or concerns.  Sincerely,  Rexene Alberts, DO Allergy & Immunology  [facility] office: [place] office: [phone]

## 2021-09-19 ENCOUNTER — Encounter: Payer: Self-pay | Admitting: Allergy

## 2021-09-19 ENCOUNTER — Other Ambulatory Visit: Payer: Self-pay

## 2021-09-19 ENCOUNTER — Ambulatory Visit: Payer: Medicare HMO | Admitting: Allergy

## 2021-09-19 VITALS — BP 114/76 | HR 67 | Temp 98.0°F | Resp 18 | Ht 61.0 in | Wt 163.1 lb

## 2021-09-19 DIAGNOSIS — R430 Anosmia: Secondary | ICD-10-CM

## 2021-09-19 DIAGNOSIS — J310 Chronic rhinitis: Secondary | ICD-10-CM | POA: Diagnosis not present

## 2021-09-19 MED ORDER — XHANCE 93 MCG/ACT NA EXHU: 1.0000 | INHALANT_SUSPENSION | Freq: Two times a day (BID) | NASAL | 5 refills | Status: DC

## 2021-09-19 NOTE — Assessment & Plan Note (Signed)
.   See assessment and plan as above. 

## 2021-09-19 NOTE — Assessment & Plan Note (Signed)
Nasal congestion and anosmia unchanged for the past 2 years.  Taking Flonase 2 sprays per nostril once a day with some benefit.  She cannot recall if the prednisone helped 2 years ago.  No prior ENT evaluation.  No prior allergy testing. 1 sinus infection in 2022.    Return for environmental allergy testing - if negative then will refer to ENT next.   Stop Flonase.  Start Xhance (fluticasone) nasal spray 1 spray per nostril twice a day as needed for nasal congestion.   Sample given and demonstrated proper use.  If this is not covered let us know.   Use saline nasal spray beforehand as needed. Declines nettipot.

## 2021-09-19 NOTE — Patient Instructions (Addendum)
Sinus: Stop Flonase. Start Xhance (fluticasone) nasal spray 1 spray per nostril twice a day as needed for nasal congestion.  Sample given and demonstrated proper use. If this is not covered let us know.  Use saline nasal spray beforehand as needed.  Return for skin testing - no allergy medications/antihistamines for 3 days before.

## 2021-09-20 NOTE — Telephone Encounter (Signed)
No problems by me Can schedule her for routine follow-up as per previous note with me RG

## 2021-09-22 ENCOUNTER — Telehealth: Payer: Self-pay

## 2021-09-22 NOTE — Telephone Encounter (Signed)
PA submitted thru cover my meds for xhance waiting on results from insurance

## 2021-09-25 NOTE — Telephone Encounter (Signed)
Pa was approved until 12/23/21

## 2021-10-12 NOTE — Telephone Encounter (Signed)
Left msg on pt's vm to schedule appt

## 2021-10-18 NOTE — Progress Notes (Signed)
Agree with plan RG 

## 2021-10-25 ENCOUNTER — Other Ambulatory Visit: Payer: Self-pay | Admitting: Family Medicine

## 2021-10-25 DIAGNOSIS — R430 Anosmia: Secondary | ICD-10-CM

## 2021-10-26 ENCOUNTER — Telehealth: Payer: Self-pay | Admitting: Allergy

## 2021-10-26 MED ORDER — FLUTICASONE PROPIONATE 50 MCG/ACT NA SUSP: 1.0000 | Freq: Two times a day (BID) | NASAL | 5 refills | Status: DC | PRN

## 2021-10-26 NOTE — Telephone Encounter (Signed)
Patient called back stating that Shelly Leonard would be $195 out of pocket. Patient stated she can not afford that and would like to switch by to [facility] since she stated it is affordable. Please advise.

## 2021-10-26 NOTE — Telephone Encounter (Signed)
Patient called and needs to have Fluticasome inhaler to [facility]. [phone].

## 2021-10-26 NOTE — Telephone Encounter (Signed)
Left a message for patient to call the office in regards to this matter. The Fluticasone Truett Perna) was sent to a mail ordered pharmacy. Patient will need to reach out to [facility] at [phone] to set up delivery.

## 2021-10-26 NOTE — Telephone Encounter (Signed)
Spoke with patient, informed her of my note. She verbalized understanding. I also informed patient that if she calls and the medication is not affordable to call the office back and we would send message to DR. Kim regarding switching back to [facility].

## 2021-10-26 NOTE — Telephone Encounter (Signed)
Sent in flonase  

## 2021-10-30 NOTE — Patient Instructions (Addendum)
Seasonal and perennial allergic rhinitis: -Continue fluticasone nasal spray using 1 to 2 sprays each nostril once a day as needed for stuffy nose.  Shelly Leonard was $195 co-pay) -May use an over-the-counter antihistamine such as Claritin, Zyrtec, Allegra, or Xyzal once a day as needed for runny nose  Anosmia/ageusia -Recommend referral to ear nose and throat ( would like a referral close to [place] and not in [place]) Shelly Leonard office will contact you with an appointment.  Please let us know if this treatment plan is not working well for you.   Schedule a follow-up appointment in 3 months or sooner if needed  Control of Dust Mite Allergen Dust mites play a major role in allergic asthma and rhinitis. They occur in environments with high humidity wherever human skin is found. Dust mites absorb humidity from the atmosphere (ie, they do not drink) and feed on organic matter (including shed human and animal skin). Dust mites are a microscopic type of insect that you cannot see with the naked eye. High levels of dust mites have been detected from mattresses, pillows, carpets, upholstered furniture, bed covers, clothes, soft toys and any woven material. The principal allergen of the dust mite is found in its feces. A gram of dust may contain 1,000 mites and 250,000 fecal particles. Mite antigen is easily measured in the air during house cleaning activities. Dust mites do not bite and do not cause harm to humans, other than by triggering allergies/asthma.  Ways to decrease your exposure to dust mites in your home:  1. Encase mattresses, box springs and pillows with a mite-impermeable barrier or cover  2. Wash sheets, blankets and drapes weekly in hot water (130 F) with detergent and dry them in a dryer on the hot setting.  3. Have the room cleaned frequently with a vacuum cleaner and a damp dust-mop. For carpeting or rugs, vacuuming with a vacuum cleaner equipped with a high-efficiency particulate air (HEPA)  filter. The dust mite allergic individual should not be in a room which is being cleaned and should wait 1 hour after cleaning before going into the room.  4. Do not sleep on upholstered furniture (eg, couches).  5. If possible removing carpeting, upholstered furniture and drapery from the home is ideal. Horizontal blinds should be eliminated in the rooms where the person spends the most time (bedroom, study, television room). Washable vinyl, roller-type shades are optimal.  6. Remove all non-washable stuffed toys from the bedroom. Wash stuffed toys weekly like sheets and blankets above.  7. Reduce indoor humidity to less than 50%. Inexpensive humidity monitors can be purchased at most hardware stores. Do not use a humidifier as can make the problem worse and are not recommended.  Reducing Pollen Exposure The American Academy of Allergy, Asthma and Immunology suggests the following steps to reduce your exposure to pollen during allergy seasons. Do not hang sheets or clothing out to dry; pollen may collect on these items. Do not mow lawns or spend time around freshly cut grass; mowing stirs up pollen. Keep windows closed at night.  Keep car windows closed while driving. Minimize morning activities outdoors, a time when pollen counts are usually at their highest. Stay indoors as much as possible when pollen counts or humidity is high and on windy days when pollen tends to remain in the air longer. Use air conditioning when possible.  Many air conditioners have filters that trap the pollen spores. Use a HEPA room air filter to remove pollen form the indoor air  you breathe.

## 2021-10-31 ENCOUNTER — Encounter: Payer: Self-pay | Admitting: Family

## 2021-10-31 ENCOUNTER — Ambulatory Visit: Payer: Medicare HMO | Admitting: Family

## 2021-10-31 ENCOUNTER — Other Ambulatory Visit: Payer: Self-pay

## 2021-10-31 VITALS — BP 134/82 | HR 71 | Temp 98.4°F | Resp 18 | Ht 61.0 in | Wt 165.8 lb

## 2021-10-31 DIAGNOSIS — J3089 Other allergic rhinitis: Secondary | ICD-10-CM

## 2021-10-31 DIAGNOSIS — R430 Anosmia: Secondary | ICD-10-CM | POA: Diagnosis not present

## 2021-10-31 DIAGNOSIS — J302 Other seasonal allergic rhinitis: Secondary | ICD-10-CM | POA: Diagnosis not present

## 2021-10-31 DIAGNOSIS — R432 Parageusia: Secondary | ICD-10-CM | POA: Diagnosis not present

## 2021-10-31 MED ORDER — FLUTICASONE PROPIONATE 50 MCG/ACT NA SUSP: 1.0000 | Freq: Two times a day (BID) | NASAL | 5 refills | Status: DC | PRN

## 2021-10-31 NOTE — Progress Notes (Signed)
[address] [place] [address] Dept: [phone]  FOLLOW UP NOTE  Patient ID: Shelly Leonard, female    DOB: May 03, 1946  Age: 75 y.o. MRN: [id number] Date of Office Visit: 10/31/2021  Assessment  Chief Complaint: Allergy Testing  HPI Shelly Leonard is a 75 year old female who presents today for skin testing to environmental allergens.  She was last seen on September 19, 2021 by Dr. Maudie Mercury for chronic rhinitis and anosmia.  Chronic rhinitis is reported as moderately controlled with fluticasone nasal spray 1 to 2 sprays each nostril once a day as needed.  She ran out of the sample of Xhance nasal spray, but was not able to continue using this medication due to the $195 co-pay.  She reports clear rhinorrhea at times, anosmia, and ageusia.  She denies nasal congestion and postnasal drip.  She has not had any sinus infections since her last office visit.  She usually gets 1 sinus infection a year.  She reports that she has not been able to taste or smell since January 2018.  She has never seen ear nose and throat.  She reports that she is hesitant to have any procedures done by ENT due to having severe nosebleeds when she was younger.   Drug Allergies:  No Known Allergies  Review of Systems: Review of Systems  Constitutional:  Negative for chills and fever.  HENT:         Reports clear rhinorrhea at times, anosmia, and ageusia.  Denies nasal congestion and postnasal drip.  Eyes:        Reports itchy watery eyes at times for which she has eyedrops that help  Respiratory:  Negative for cough, shortness of breath and wheezing.   Cardiovascular:  Negative for chest pain and palpitations.  Gastrointestinal:        Denies heartburn or reflux symptoms.  Reports she currently takes a medicine and tries to avoid foods that bother her  Genitourinary:  Negative for frequency.  Skin:  Negative for itching and rash.  Neurological:  Negative for headaches.    Physical Exam: BP 134/82    Pulse 71   Temp 98.4 F (36.9 C) (Temporal)   Resp 18   Ht 5\' 1"  (1.549 m)   Wt 165 lb 12 oz (75.2 kg)   SpO2 98%   BMI 31.32 kg/m    Physical Exam Constitutional:      Appearance: Normal appearance.  HENT:     Head: Normocephalic and atraumatic.     Comments: Pharynx normal, eyes normal, ears normal, nose: Bilateral lower turbinates mildly edematous and slightly erythematous with clear drainage noted    Right Ear: Tympanic membrane, ear canal and external ear normal.     Left Ear: Tympanic membrane, ear canal and external ear normal.     Mouth/Throat:     Mouth: Mucous membranes are moist.     Pharynx: Oropharynx is clear.  Eyes:     Conjunctiva/sclera: Conjunctivae normal.  Cardiovascular:     Rate and Rhythm: Regular rhythm.     Heart sounds: Normal heart sounds.  Pulmonary:     Effort: Pulmonary effort is normal.     Breath sounds: Normal breath sounds.     Comments: Lungs clear to auscultation Musculoskeletal:     Cervical back: Neck supple.  Skin:    General: Skin is warm.  Neurological:     Mental Status: She is alert and oriented to person, place, and time.  Psychiatric:  Mood and Affect: Mood normal.        Behavior: Behavior normal.        Thought Content: Thought content normal.        Judgment: Judgment normal.    Diagnostics: Percutaneous skin testing today was positive to grass pollen, tree pollen, and dust mite.    Intradermal skin testing was negative to cockroach,cat, dog, mold, ragweed, and weed mix. .  Assessment and Plan: 1. Seasonal and perennial allergic rhinitis   2. Anosmia   3. Ageusia     No orders of the defined types were placed in this encounter.   Patient Instructions  Seasonal and perennial allergic rhinitis: -Continue fluticasone nasal spray using 1 to 2 sprays each nostril once a day as needed for stuffy nose.  Truett Perna was $195 co-pay) -May use an over-the-counter antihistamine such as Claritin, Zyrtec, Allegra, or  Xyzal once a day as needed for runny nose  Anosmia/ageusia -Recommend referral to ear nose and throat ( would like a referral close to [place] and not in [place]) [place] office will contact you with an appointment.  Please let us know if this treatment plan is not working well for you.   Schedule a follow-up appointment in 3 months or sooner if needed  Control of Dust Mite Allergen Dust mites play a major role in allergic asthma and rhinitis. They occur in environments with high humidity wherever human skin is found. Dust mites absorb humidity from the atmosphere (ie, they do not drink) and feed on organic matter (including shed human and animal skin). Dust mites are a microscopic type of insect that you cannot see with the naked eye. High levels of dust mites have been detected from mattresses, pillows, carpets, upholstered furniture, bed covers, clothes, soft toys and any woven material. The principal allergen of the dust mite is found in its feces. A gram of dust may contain 1,000 mites and 250,000 fecal particles. Mite antigen is easily measured in the air during house cleaning activities. Dust mites do not bite and do not cause harm to humans, other than by triggering allergies/asthma.  Ways to decrease your exposure to dust mites in your home:  1. Encase mattresses, box springs and pillows with a mite-impermeable barrier or cover  2. Wash sheets, blankets and drapes weekly in hot water (130 F) with detergent and dry them in a dryer on the hot setting.  3. Have the room cleaned frequently with a vacuum cleaner and a damp dust-mop. For carpeting or rugs, vacuuming with a vacuum cleaner equipped with a high-efficiency particulate air (HEPA) filter. The dust mite allergic individual should not be in a room which is being cleaned and should wait 1 hour after cleaning before going into the room.  4. Do not sleep on upholstered furniture (eg, couches).  5. If possible removing carpeting,  upholstered furniture and drapery from the home is ideal. Horizontal blinds should be eliminated in the rooms where the person spends the most time (bedroom, study, television room). Washable vinyl, roller-type shades are optimal.  6. Remove all non-washable stuffed toys from the bedroom. Wash stuffed toys weekly like sheets and blankets above.  7. Reduce indoor humidity to less than 50%. Inexpensive humidity monitors can be purchased at most hardware stores. Do not use a humidifier as can make the problem worse and are not recommended.  Reducing Pollen Exposure The American Academy of Allergy, Asthma and Immunology suggests the following steps to reduce your exposure to pollen during allergy seasons.  Do not hang sheets or clothing out to dry; pollen may collect on these items. Do not mow lawns or spend time around freshly cut grass; mowing stirs up pollen. Keep windows closed at night.  Keep car windows closed while driving. Minimize morning activities outdoors, a time when pollen counts are usually at their highest. Stay indoors as much as possible when pollen counts or humidity is high and on windy days when pollen tends to remain in the air longer. Use air conditioning when possible.  Many air conditioners have filters that trap the pollen spores. Use a HEPA room air filter to remove pollen form the indoor air you breathe.  Return in about 3 months (around 01/31/2022), or if symptoms worsen or fail to improve.    Thank you for the opportunity to care for this patient.  Please do not hesitate to contact me with questions.  Althea Charon, FNP [facility]

## 2021-11-09 ENCOUNTER — Telehealth: Payer: Self-pay

## 2021-11-09 NOTE — Telephone Encounter (Signed)
Thank you for trying.

## 2021-11-09 NOTE — Telephone Encounter (Signed)
-----   Message from Althea Charon, [place] sent at 10/31/2021  3:37 PM EST ----- Refer to ENT near [facility] due to anosmia and ageusia.

## 2021-11-09 NOTE — Telephone Encounter (Signed)
Patient states she doesn't want to go to an ENT as they are not going to be able to do anything for her.  Referral has been closed.

## 2021-11-14 ENCOUNTER — Other Ambulatory Visit: Payer: Self-pay | Admitting: Family Medicine

## 2021-11-14 DIAGNOSIS — I10 Essential (primary) hypertension: Secondary | ICD-10-CM

## 2021-12-11 ENCOUNTER — Other Ambulatory Visit: Payer: Self-pay | Admitting: Family Medicine

## 2021-12-11 DIAGNOSIS — I10 Essential (primary) hypertension: Secondary | ICD-10-CM

## 2021-12-12 ENCOUNTER — Telehealth: Payer: Self-pay

## 2021-12-12 NOTE — Telephone Encounter (Signed)
Patient calling in reference to refill request.  Medication was approved for another 30 d/s 01/09/2021.  Patient must keep her upcoming appt on January 17 with Dr. Raoul Pitch.  verapamil (CALAN) 120 MG tablet [[id number]]

## 2021-12-12 NOTE — Telephone Encounter (Signed)
Noted  

## 2022-01-09 ENCOUNTER — Encounter: Payer: Self-pay | Admitting: Family Medicine

## 2022-01-09 ENCOUNTER — Ambulatory Visit (INDEPENDENT_AMBULATORY_CARE_PROVIDER_SITE_OTHER): Payer: Medicare HMO | Admitting: Family Medicine

## 2022-01-09 ENCOUNTER — Other Ambulatory Visit: Payer: Self-pay

## 2022-01-09 VITALS — BP 135/80 | HR 67 | Temp 97.5°F | Ht 61.0 in | Wt 161.4 lb

## 2022-01-09 DIAGNOSIS — E559 Vitamin D deficiency, unspecified: Secondary | ICD-10-CM

## 2022-01-09 DIAGNOSIS — I10 Essential (primary) hypertension: Secondary | ICD-10-CM

## 2022-01-09 DIAGNOSIS — Z6830 Body mass index (BMI) 30.0-30.9, adult: Secondary | ICD-10-CM | POA: Diagnosis not present

## 2022-01-09 DIAGNOSIS — M5410 Radiculopathy, site unspecified: Secondary | ICD-10-CM

## 2022-01-09 DIAGNOSIS — K219 Gastro-esophageal reflux disease without esophagitis: Secondary | ICD-10-CM | POA: Diagnosis not present

## 2022-01-09 DIAGNOSIS — Z79899 Other long term (current) drug therapy: Secondary | ICD-10-CM

## 2022-01-09 DIAGNOSIS — M8580 Other specified disorders of bone density and structure, unspecified site: Secondary | ICD-10-CM | POA: Diagnosis not present

## 2022-01-09 DIAGNOSIS — D508 Other iron deficiency anemias: Secondary | ICD-10-CM

## 2022-01-09 DIAGNOSIS — Z5181 Encounter for therapeutic drug level monitoring: Secondary | ICD-10-CM

## 2022-01-09 MED ORDER — PANTOPRAZOLE SODIUM 20 MG PO TBEC: 20.0000 mg | DELAYED_RELEASE_TABLET | Freq: Every day | ORAL | 1 refills | Status: DC | PRN

## 2022-01-09 MED ORDER — MELOXICAM 15 MG PO TABS: 15.0000 mg | ORAL_TABLET | Freq: Every day | ORAL | 1 refills | Status: DC

## 2022-01-09 MED ORDER — VERAPAMIL HCL 120 MG PO TABS: 120.0000 mg | ORAL_TABLET | Freq: Two times a day (BID) | ORAL | 1 refills | Status: DC

## 2022-01-09 MED ORDER — TRIAMTERENE-HCTZ 75-50 MG PO TABS: 1.0000 | ORAL_TABLET | Freq: Every day | ORAL | 1 refills | Status: DC

## 2022-01-09 MED ORDER — TIZANIDINE HCL 2 MG PO CAPS: 2.0000 mg | ORAL_CAPSULE | Freq: Two times a day (BID) | ORAL | 1 refills | Status: DC | PRN

## 2022-01-09 NOTE — Progress Notes (Signed)
Patient Care Team    Relationship Specialty Notifications Start End  [name], DO PCP - General Family Medicine  08/18/15   Warden Fillers, MD Consulting Physician Ophthalmology  05/08/16   Milus Banister, MD Attending Physician Gastroenterology  05/08/16   Thornell Sartorius, MD Consulting Physician Otolaryngology  06/25/17   Manson Passey, [facility]  Specialist  07/01/18   Madelin Rear, MD Consulting Physician Endocrinology  01/28/20     Chief Complaint  Patient presents with   Follow-up    Follow up medication refills    History of Present Ilness: Shelly Leonard, 76 y.o. , female presents today for [facility]  Essential hypertension, benign/morbid obesity Pt reports compliance with Maxzide 75-50 and verapamil 120 mg daily. Blood pressures ranges at home not routinely checked. Patient denies chest pain, shortness of breath, dizziness or lower extremity edema.  Pt takes a daily baby ASA. Pt is not prescribed statin. Labs due Diet: Low sodium. Exercise: Routine exercise. RF: Hypertension, obesity, family history of heart disease   Gastroesophageal reflux disease, esophagitis presence not specified Patient reports compliance  with Protonix 20 mg daily.   Medicine is working well for her   Hypercalcemia/Thyroid nodule She does have multiple nodules within the thyroid.  Her biopsy was negative for any type of malignancy.  SHe has started to increase her water consumption daily. She is following with endocrinology.      Past Medical History:  Diagnosis Date   Allergy    Anemia    low iron (hgb 8)   Arthritis    knees and hands    Carpal tunnel syndrome    Cataract    Diverticulitis large intestine w/o perforation or abscess w/bleeding    GERD (gastroesophageal reflux disease)    Headache(784.0)    occasional    Hypertension    Lumbar spondylosis    Osteopenia    PONV (postoperative nausea and vomiting)    Vertigo    Vitamin D deficiency    All allergies reviewed No  Known Allergies Past Surgical History:  Procedure Laterality Date   BREAST REDUCTION SURGERY  1982   CARPAL TUNNEL RELEASE     left    COLONOSCOPY     FOOT SURGERY     KNEE ARTHROSCOPY     right knee 2012    OTHER SURGICAL HISTORY     spur removed top of left foot    REDUCTION MAMMAPLASTY  1982   TOTAL KNEE ARTHROPLASTY  04/21/2012   Procedure: TOTAL KNEE ARTHROPLASTY;  Surgeon: [name], MD;  Location: [facility];  Service: Orthopedics;  Laterality: Right;   VAGINAL HYSTERECTOMY  1977   Family History  Problem Relation Age of Onset   Breast cancer Mother        late in life   Diabetes Mother        late in life   Heart disease Father    Lung disease Father    Colon cancer Neg Hx    Esophageal cancer Neg Hx    Stomach cancer Neg Hx    Rectal cancer Neg Hx    Social History   Social History Narrative   Shelly Leonard is widowed (12/2016).    She enjoys music and is involved with her church.    All medications verified Allergies as of 01/09/2022   No Known Allergies      Medication List        Accurate as of January 09, 2022  1:59 PM. If you have any questions, ask your nurse or doctor.          aspirin EC 81 MG tablet Take 81 mg by mouth daily.   fluticasone 50 MCG/ACT nasal spray Commonly known as: Flonase Place 1 spray into both nostrils 2 (two) times daily as needed (nasal congestion).   Garlic 646 MG Tabs Take 100 mg by mouth daily with breakfast.   meloxicam 15 MG tablet Commonly known as: MOBIC Take 1 tablet (15 mg total) by mouth daily.   OMEGA 3 PO Take 1 capsule by mouth daily.   pantoprazole 20 MG tablet Commonly known as: PROTONIX Take 1 tablet (20 mg total) by mouth daily as needed.   PROBIOTIC DAILY PO Take by mouth.   tizanidine 2 MG capsule Commonly known as: Zanaflex Take 1 capsule (2 mg total) by mouth 2 (two) times daily as needed for muscle spasms.   triamterene-hydrochlorothiazide 75-50 MG tablet Commonly known as:  MAXZIDE Take 1 tablet by mouth daily.   verapamil 120 MG tablet Commonly known as: CALAN Take 1 tablet (120 mg total) by mouth 2 (two) times daily.   VITAMIN D PO Take by mouth.        Depression screen [facility] 2/9 08/30/2021 02/10/2021 08/09/2020 08/04/2019 02/05/2019  Decreased Interest 0 0 0 0 0  Down, Depressed, Hopeless 0 0 1 0 0  PHQ - 2 Score 0 0 1 0 0  Altered sleeping - - - - -  Tired, decreased energy - - - - -  Change in appetite - - - - -  Feeling bad or failure about yourself  - - - - -  Trouble concentrating - - - - -  Moving slowly or fidgety/restless - - - - -  Suicidal thoughts - - - - -  PHQ-9 Score - - - - -  Difficult doing work/chores - - - - -   No flowsheet data found.   Immunizations: Immunization History  Administered Date(s) Administered   PFIZER(Purple Top)SARS-COV-2 Vaccination 03/17/2020, 04/12/2020, 11/30/2020   Pfizer Covid-19 Vaccine Bivalent Booster 53yrs & up 10/25/2021   Pneumococcal Conjugate-13 04/01/2014   Pneumococcal Polysaccharide-23 11/29/2015   Td 08/14/2011   Zoster, Live 01/20/2013   Objective:  BP 135/80 (BP Location: Left Arm, Patient Position: Sitting, Cuff Size: Large)    Pulse 67    Temp (!) 97.5 F (36.4 C) (Oral)    Ht 5\' 1"  (1.549 m)    Wt 161 lb 6.4 oz (73.2 kg)    SpO2 99%    BMI 30.50 kg/m  Physical Exam Vitals and nursing note reviewed.  Constitutional:      General: She is not in acute distress.    Appearance: Normal appearance. She is not ill-appearing, toxic-appearing or diaphoretic.  HENT:     Head: Normocephalic and atraumatic.     Mouth/Throat:     Mouth: Mucous membranes are moist.  Eyes:     General: No scleral icterus.       Right eye: No discharge.        Left eye: No discharge.     Extraocular Movements: Extraocular movements intact.     Conjunctiva/sclera: Conjunctivae normal.     Pupils: Pupils are equal, round, and reactive to light.  Cardiovascular:     Rate and Rhythm: Normal rate and regular  rhythm.  Pulmonary:     Effort: Pulmonary effort is normal. No respiratory distress.     Breath sounds: Normal breath sounds. No  wheezing, rhonchi or rales.  Musculoskeletal:     Cervical back: Neck supple. No tenderness.  Lymphadenopathy:     Cervical: No cervical adenopathy.  Skin:    General: Skin is warm and dry.     Coloration: Skin is not jaundiced or pale.     Findings: No erythema or rash.  Neurological:     Mental Status: She is alert and oriented to person, place, and time. Mental status is at baseline.     Motor: No weakness.     Gait: Gait normal.  Psychiatric:        Mood and Affect: Mood normal.        Behavior: Behavior normal.        Thought Content: Thought content normal.        Judgment: Judgment normal.     Assessment and Plan :Shelly Leonard is a 76 y.o. female present for [facility] and fatigue Essential hypertension, benign Stable.  Continue Maxide  continue  verapamil BID -Low-sodium diet, exercise encouraged. Cbc, bmp, tsh, lipids collected today -Follow-up 5.5 months.   Gastroesophageal reflux disease, esophagitis presence not specified/long term use of ppi Stable Continue   Protonix. Vitd, b12 and mag    Hypercalcemia/Thyroid nodule - established with endocrine. Continue routine follow-ups. -Biopsy was benign.   -Continue to work on adequate hydration.   Chronic back pain: Stable.  Continue Zanaflex 2 mg twice daily as needed. Encouraged her to take before bed.  Patient reports she rarely needs.  Meds ordered this encounter  Medications   meloxicam (MOBIC) 15 MG tablet    Sig: Take 1 tablet (15 mg total) by mouth daily.    Dispense:  90 tablet    Refill:  1   pantoprazole (PROTONIX) 20 MG tablet    Sig: Take 1 tablet (20 mg total) by mouth daily as needed.    Dispense:  90 tablet    Refill:  1   triamterene-hydrochlorothiazide (MAXZIDE) 75-50 MG tablet    Sig: Take 1 tablet by mouth daily.    Dispense:  90 tablet    Refill:  1    verapamil (CALAN) 120 MG tablet    Sig: Take 1 tablet (120 mg total) by mouth 2 (two) times daily.    Dispense:  180 tablet    Refill:  1   tizanidine (ZANAFLEX) 2 MG capsule    Sig: Take 1 capsule (2 mg total) by mouth 2 (two) times daily as needed for muscle spasms.    Dispense:  180 capsule    Refill:  1   Orders Placed This Encounter  Procedures   Basic metabolic panel   TSH   T3, free   T4, free   CBC   Direct LDL   Vitamin D (25 hydroxy)   B12   Magnesium    Electronically Signed by: Howard Pouch, DO [place]

## 2022-01-09 NOTE — Patient Instructions (Signed)
Great to see you today.  I have refilled the medication(s) we provide.   If labs were collected, we will inform you of lab results once received either by echart message or telephone call.   - echart message- for normal results that have been seen by the patient already.   - telephone call: abnormal results or if patient has not viewed results in their echart.  

## 2022-01-10 LAB — CBC
HCT: 40.7 % (ref 35.0–45.0)
Hemoglobin: 13.5 g/dL (ref 11.7–15.5)
MCH: 29 pg (ref 27.0–33.0)
MCHC: 33.2 g/dL (ref 32.0–36.0)
MCV: 87.3 fL (ref 80.0–100.0)
MPV: 9.5 fL (ref 7.5–12.5)
Platelets: 323 10*3/uL (ref 140–400)
RBC: 4.66 10*6/uL (ref 3.80–5.10)
RDW: 13.5 % (ref 11.0–15.0)
WBC: 5.9 10*3/uL (ref 3.8–10.8)

## 2022-01-10 LAB — MAGNESIUM: Magnesium: 1.9 mg/dL (ref 1.5–2.5)

## 2022-01-10 LAB — BASIC METABOLIC PANEL
BUN/Creatinine Ratio: 25 (calc) — ABNORMAL HIGH (ref 6–22)
BUN: 15 mg/dL (ref 7–25)
CO2: 28 mmol/L (ref 20–32)
Calcium: 10.8 mg/dL — ABNORMAL HIGH (ref 8.6–10.4)
Chloride: 100 mmol/L (ref 98–110)
Creat: 0.59 mg/dL — ABNORMAL LOW (ref 0.60–1.00)
Glucose, Bld: 88 mg/dL (ref 65–99)
Potassium: 3.9 mmol/L (ref 3.5–5.3)
Sodium: 135 mmol/L (ref 135–146)

## 2022-01-10 LAB — VITAMIN B12: Vitamin B-12: 561 pg/mL (ref 200–1100)

## 2022-01-10 LAB — T4, FREE: Free T4: 1 ng/dL (ref 0.8–1.8)

## 2022-01-10 LAB — VITAMIN D 25 HYDROXY (VIT D DEFICIENCY, FRACTURES): Vit D, 25-Hydroxy: 27 ng/mL — ABNORMAL LOW (ref 30–100)

## 2022-01-10 LAB — T3, FREE: T3, Free: 3.2 pg/mL (ref 2.3–4.2)

## 2022-01-10 LAB — LDL CHOLESTEROL, DIRECT: Direct LDL: 113 mg/dL — ABNORMAL HIGH (ref <100)

## 2022-01-10 LAB — TSH: TSH: 0.5 mIU/L (ref 0.40–4.50)

## 2022-01-11 ENCOUNTER — Telehealth: Payer: Self-pay | Admitting: Family Medicine

## 2022-01-11 NOTE — Telephone Encounter (Signed)
Spoke with patient regarding results/recommendations.  

## 2022-01-11 NOTE — Telephone Encounter (Signed)
Pt called about results--KR Pt cell: [phone]

## 2022-02-16 DIAGNOSIS — M79641 Pain in right hand: Secondary | ICD-10-CM | POA: Diagnosis not present

## 2022-02-27 ENCOUNTER — Encounter: Payer: Self-pay | Admitting: Gastroenterology

## 2022-02-27 ENCOUNTER — Ambulatory Visit: Payer: Medicare HMO | Admitting: Gastroenterology

## 2022-02-27 ENCOUNTER — Other Ambulatory Visit (INDEPENDENT_AMBULATORY_CARE_PROVIDER_SITE_OTHER): Payer: Medicare HMO

## 2022-02-27 ENCOUNTER — Other Ambulatory Visit: Payer: Self-pay

## 2022-02-27 VITALS — BP 150/90 | HR 70 | Ht 61.0 in | Wt 163.1 lb

## 2022-02-27 DIAGNOSIS — K219 Gastro-esophageal reflux disease without esophagitis: Secondary | ICD-10-CM | POA: Diagnosis not present

## 2022-02-27 DIAGNOSIS — Z8719 Personal history of other diseases of the digestive system: Secondary | ICD-10-CM | POA: Diagnosis not present

## 2022-02-27 DIAGNOSIS — R1012 Left upper quadrant pain: Secondary | ICD-10-CM | POA: Diagnosis not present

## 2022-02-27 LAB — CBC WITH DIFFERENTIAL/PLATELET
Basophils Absolute: 0 10*3/uL (ref 0.0–0.1)
Basophils Relative: 0.5 % (ref 0.0–3.0)
Eosinophils Absolute: 0.3 10*3/uL (ref 0.0–0.7)
Eosinophils Relative: 5.6 % — ABNORMAL HIGH (ref 0.0–5.0)
HCT: 38.9 % (ref 36.0–46.0)
Hemoglobin: 12.8 g/dL (ref 12.0–15.0)
Lymphocytes Relative: 42 % (ref 12.0–46.0)
Lymphs Abs: 2.5 10*3/uL (ref 0.7–4.0)
MCHC: 32.9 g/dL (ref 30.0–36.0)
MCV: 88.2 fl (ref 78.0–100.0)
Monocytes Absolute: 0.5 10*3/uL (ref 0.1–1.0)
Monocytes Relative: 7.7 % (ref 3.0–12.0)
Neutro Abs: 2.6 10*3/uL (ref 1.4–7.7)
Neutrophils Relative %: 44.2 % (ref 43.0–77.0)
Platelets: 276 10*3/uL (ref 150.0–400.0)
RBC: 4.41 Mil/uL (ref 3.87–5.11)
RDW: 14.7 % (ref 11.5–15.5)
WBC: 5.8 10*3/uL (ref 4.0–10.5)

## 2022-02-27 LAB — COMPREHENSIVE METABOLIC PANEL
ALT: 12 U/L (ref 0–35)
AST: 15 U/L (ref 0–37)
Albumin: 4 g/dL (ref 3.5–5.2)
Alkaline Phosphatase: 46 U/L (ref 39–117)
BUN: 14 mg/dL (ref 6–23)
CO2: 27 mEq/L (ref 19–32)
Calcium: 10.3 mg/dL (ref 8.4–10.5)
Chloride: 101 mEq/L (ref 96–112)
Creatinine, Ser: 0.59 mg/dL (ref 0.40–1.20)
GFR: 88.81 mL/min (ref >60.00)
Glucose, Bld: 101 mg/dL — ABNORMAL HIGH (ref 70–99)
Potassium: 3.3 mEq/L — ABNORMAL LOW (ref 3.5–5.1)
Sodium: 136 mEq/L (ref 135–145)
Total Bilirubin: 0.5 mg/dL (ref 0.2–1.2)
Total Protein: 7 g/dL (ref 6.0–8.3)

## 2022-02-27 MED ORDER — AMOXICILLIN-POT CLAVULANATE 875-125 MG PO TABS: 1.0000 | ORAL_TABLET | Freq: Two times a day (BID) | ORAL | 0 refills | Status: DC

## 2022-02-27 NOTE — Patient Instructions (Addendum)
If you are age 75 or older, your body mass index should be between 23-30. Your Body mass index is 30.82 kg/m. If this is out of the aforementioned range listed, please consider follow up with your Primary Care Provider.  If you are age 12 or younger, your body mass index should be between 19-25. Your Body mass index is 30.82 kg/m. If this is out of the aformentioned range listed, please consider follow up with your Primary Care Provider.   ________________________________________________________  The [facility] providers would like to encourage you to use [facility] to communicate with providers for non-urgent requests or questions.  Due to long hold times on the telephone, sending your provider a message by [facility] may be a faster and more efficient way to get a response.  Please allow 48 business hours for a response.  Please remember that this is for non-urgent requests.  _______________________________________________________  Please go to the lab on the 2nd floor [address] before you leave the office today.   We have sent the following medications to your pharmacy for you to pick up at your convenience: Augmentin 2 times a day for 10 days   Please call in 3 business days to schedule this  You have been scheduled for a CT [facility] the abdomen [facility] [place] [address] [address]).   You are scheduled on      at         . You should arrive 15 minutes prior to your appointment time for registration. Please follow the written instructions below on the day of your exam:  WARNING: IF YOU ARE ALLERGIC TO IODINE/X-RAY DYE, PLEASE NOTIFY RADIOLOGY IMMEDIATELY AT [phone]! YOU WILL BE GIVEN A 13 HOUR PREMEDICATION PREP.  1) Do not eat or drink anything after          (4 hours prior to your test) 2) You have been given 2 bottles of oral contrast to drink. The solution may taste better if refrigerated, but do NOT add ice or any other  liquid to this solution. Shake well before drinking.    Drink 1 bottle of contrast @          (2 hours prior to your exam)  Drink 1 bottle of contrast @           (1 hour prior to your exam)  You may take any medications as prescribed with a small amount of water, if necessary. If you take any of the following medications: METFORMIN, GLUCOPHAGE, GLUCOVANCE, AVANDAMET, RIOMET, FORTAMET, [place] MET, JANUMET, GLUMETZA or METAGLIP, you MAY be asked to HOLD this medication 48 hours AFTER the exam.  The purpose of you drinking the oral contrast is to aid in the visualization of your intestinal tract. The contrast solution may cause some diarrhea. Depending on your individual set of symptoms, you may also receive an intravenous injection of x-ray contrast/dye. Plan on being at [facility] for 30 minutes or longer, depending on the type of exam you are having performed.  This test typically takes 30-45 minutes to complete.  If you have any questions regarding your exam or if you need to reschedule, you may call the CT department at [phone] between the hours of 8:00 am and 5:00 pm, Monday-Friday.  ________________________________________________________________________  Please do a High Fiber diet High-Fiber Eating Plan Fiber, also called dietary fiber, is a type of carbohydrate. It is found foods such as fruits, vegetables,  whole grains, and beans. A high-fiber diet can have many health benefits. Your health care provider may recommend a high-fiber diet to help: Prevent constipation. Fiber can make your bowel movements more regular. Lower your cholesterol. Relieve the following conditions: Inflammation of veins in the anus (hemorrhoids). Inflammation of specific areas of the digestive tract (uncomplicated diverticulosis). A problem of the large intestine, also called the colon, that sometimes causes pain and diarrhea (irritable bowel syndrome, or IBS). Prevent overeating as part of a  weight-loss plan. Prevent heart disease, type 2 diabetes, and certain cancers. What are tips for following this plan? Reading food labels  Check the nutrition facts label on food products for the amount of dietary fiber. Choose foods that have 5 grams of fiber or more per serving. The goals for recommended daily fiber intake include: Men (age 68 or younger): 34-38 g. Men (over age 37): 28-34 g. Women (age 48 or younger): 25-28 g. Women (over age 70): 22-25 g. Your daily fiber goal is _____________ g. Shopping Choose whole fruits and vegetables instead of processed forms, such as apple juice or applesauce. Choose a wide variety of high-fiber foods such as avocados, lentils, oats, and kidney beans. Read the nutrition facts label of the foods you choose. Be aware of foods with added fiber. These foods often have high sugar and sodium amounts per serving. Cooking Use whole-grain flour for baking and cooking. Cook with brown rice instead of white rice. Meal planning Start the day with a breakfast that is high in fiber, such as a cereal that contains 5 g of fiber or more per serving. Eat breads and cereals that are made with whole-grain flour instead of refined flour or white flour. Eat brown rice, bulgur wheat, or millet instead of white rice. Use beans in place of meat in soups, salads, and pasta dishes. Be sure that half of the grains you eat each day are whole grains. General information You can get the recommended daily intake of dietary fiber by: Eating a variety of fruits, vegetables, grains, nuts, and beans. Taking a fiber supplement if you are not able to take in enough fiber in your diet. It is better to get fiber through food than from a supplement. Gradually increase how much fiber you consume. If you increase your intake of dietary fiber too quickly, you may have bloating, cramping, or gas. Drink plenty of water to help you digest fiber. Choose high-fiber snacks, such as  berries, raw vegetables, nuts, and popcorn. What foods should I eat? Fruits Berries. Pears. Apples. Oranges. Avocado. Prunes and raisins. Dried figs. Vegetables Sweet potatoes. Spinach. Kale. Artichokes. Cabbage. Broccoli. Cauliflower. Green peas. Carrots. Squash. Grains Whole-grain breads. Multigrain cereal. Oats and oatmeal. Brown rice. Barley. Bulgur wheat. [place]. Quinoa. Bran muffins. Popcorn. Rye wafer crackers. Meats and other proteins Navy beans, kidney beans, and pinto beans. Soybeans. Split peas. Lentils. Nuts and seeds. Dairy Fiber-fortified yogurt. Beverages Fiber-fortified soy milk. Fiber-fortified orange juice. Other foods Fiber bars. The items listed above may not be a complete list of recommended foods and beverages. Contact a dietitian for more information. What foods should I avoid? Fruits Fruit juice. Cooked, strained fruit. Vegetables Fried potatoes. Canned vegetables. Well-cooked vegetables. Grains White bread. Pasta made with refined flour. White rice. Meats and other proteins Fatty cuts of meat. Fried chicken or fried fish. Dairy Milk. Yogurt. Cream cheese. Sour cream. Fats and oils Butters. Beverages Soft drinks. Other foods Cakes and pastries. The items listed above may not be a complete list  of foods and beverages to avoid. Talk with your dietitian about what choices are best for you. Summary Fiber is a type of carbohydrate. It is found in foods such as fruits, vegetables, whole grains, and beans. A high-fiber diet has many benefits. It can help to prevent constipation, lower blood cholesterol, aid weight loss, and reduce your risk of heart disease, diabetes, and certain cancers. Increase your intake of fiber gradually. Increasing fiber too quickly may cause cramping, bloating, and gas. Drink plenty of water while you increase the amount of fiber you consume. The best sources of fiber include whole fruits and vegetables, whole grains, nuts, seeds, and  beans. This information is not intended to replace advice given to you by your health care provider. Make sure you discuss any questions you have with your health care provider. Document Revised: 04/14/2020 Document Reviewed: 04/14/2020 Elsevier Patient Education  2022 [place].  Thank you,  Dr. Jackquline Denmark

## 2022-02-27 NOTE — Progress Notes (Signed)
? ? ?Chief Complaint:  ? ?Referring Provider:  [name], DO    ? ? ?ASSESSMENT AND PLAN;  ? ?#1. LUQ pain-presumed recurrent diverticulitis. Has associated DJD, left sciatica. ? ?#2. H/O recurrent presumed diverticulitis. Neg colon 05/2016 ? ?#3. GERD on Protonix 20 mg p.o. once a day ? ?Plan: ?-Augmentin 875/125 po BID x 10 days. ?-CBC, CMP ?-CT AP with contrast ?-High fiber diet. ?-FU thereafter. ?-Use heating pads for musculoskeletal component as well. ?-If continued problems, repeat colonoscopy.  She would like to hold off at this time. ? ? ?HPI:   ? ?Shelly Leonard is a 76 y.o. female  ? ?LUQ pain x 1 yr, intermittent, Rxed with Augmentin for presumed diverticulitis with good results in past.  ?With bloating ?No diarrhea or constipation ?Had subjective chills but no fevers. ?Would like to get another course of antibiotics. ?Antibiotics did help with bloating as well. ? ?Has associated back pain and left sciatica. ? ?No melena or hematochezia. ? ?No sodas, chocolates, chewing gums, artificial sweeteners and candy. No NSAIDs except Mobic ? ?Denies having any upper GI symptoms including nausea, heartburn (on Protonix 20 mg p.o. once a day), odynophagia or dysphagia. ? ? ?Past GI procedures: ? ?Colonoscopy 05/29/2016 ?- One 3 mm polyp in the cecum, removed with a cold snare. Resected and retrieved. Bx- neg ?- Diverticulosis in the entire examined colon. ?- The examination was otherwise normal on direct and retroflexion views. ?- Bx- neg. repeat in 10 years. ? ? ?Past Medical History:  ?Diagnosis Date  ? Allergy   ? Anemia   ? low iron (hgb 8)  ? Arthritis   ? knees and hands   ? Carpal tunnel syndrome   ? Cataract   ? Diverticulitis large intestine w/o perforation or abscess w/bleeding   ? GERD (gastroesophageal reflux disease)   ? Headache(784.0)   ? occasional   ? Hypertension   ? Lumbar spondylosis   ? Osteopenia   ? PONV (postoperative nausea and vomiting)   ? Vertigo   ? Vitamin D deficiency   ? ? ?Past  Surgical History:  ?Procedure Laterality Date  ? BREAST REDUCTION SURGERY  1982  ? CARPAL TUNNEL RELEASE    ? left   ? COLONOSCOPY    ? FOOT SURGERY    ? KNEE ARTHROSCOPY    ? right knee 2012   ? OTHER SURGICAL HISTORY    ? spur removed top of left foot   ? REDUCTION MAMMAPLASTY  1982  ? TOTAL KNEE ARTHROPLASTY  04/21/2012  ? Procedure: TOTAL KNEE ARTHROPLASTY;  Surgeon: [name], MD;  Location: [facility];  Service: Orthopedics;  Laterality: Right;  ? VAGINAL HYSTERECTOMY  1977  ? ? ?Family History  ?Problem Relation Age of Onset  ? Breast cancer Mother   ?     late in life  ? Diabetes Mother   ?     late in life  ? Heart disease Father   ? Lung disease Father   ? Colon cancer Neg Hx   ? Esophageal cancer Neg Hx   ? Stomach cancer Neg Hx   ? Rectal cancer Neg Hx   ? ? ?Social History  ? ?Tobacco Use  ? Smoking status: Never  ? Smokeless tobacco: Never  ?Vaping Use  ? Vaping Use: Never used  ?Substance Use Topics  ? Alcohol use: No  ? Drug use: No  ? ? ?Current Outpatient Medications  ?Medication Sig Dispense Refill  ? aspirin  EC 81 MG tablet Take 81 mg by mouth daily.    ? Cholecalciferol (VITAMIN D PO) Take by mouth.    ? fluticasone (FLONASE) 50 MCG/ACT nasal spray Place 1 spray into both nostrils 2 (two) times daily as needed (nasal congestion). 16 g 5  ? Garlic 342 MG TABS Take 100 mg by mouth daily with breakfast.     ? meloxicam (MOBIC) 15 MG tablet Take 1 tablet (15 mg total) by mouth daily. 90 tablet 1  ? Omega-3 Fatty Acids (OMEGA 3 PO) Take 1 capsule by mouth daily.    ? pantoprazole (PROTONIX) 20 MG tablet Take 1 tablet (20 mg total) by mouth daily as needed. 90 tablet 1  ? Probiotic Product (PROBIOTIC DAILY PO) Take by mouth.    ? tizanidine (ZANAFLEX) 2 MG capsule Take 1 capsule (2 mg total) by mouth 2 (two) times daily as needed for muscle spasms. 180 capsule 1  ? triamterene-hydrochlorothiazide (MAXZIDE) 75-50 MG tablet Take 1 tablet by mouth daily. 90 tablet 1  ? verapamil (CALAN) 120 MG tablet  Take 1 tablet (120 mg total) by mouth 2 (two) times daily. 180 tablet 1  ? ?No current facility-administered medications for this visit.  ? ? ?No Known Allergies ? ?Review of Systems:  ?Constitutional: Denies fever, chills, diaphoresis, appetite change and fatigue.  ?HEENT: Denies photophobia, eye pain, redness, hearing loss, ear pain, congestion, sore throat, rhinorrhea, sneezing, mouth sores, neck pain, neck stiffness and tinnitus.   ?Respiratory: Denies SOB, DOE, cough, chest tightness,  and wheezing.   ?Cardiovascular: Denies chest pain, palpitations and leg swelling.  ?Genitourinary: Denies dysuria, urgency, frequency, hematuria, flank pain and difficulty urinating.  ?Musculoskeletal: Has myalgias, back pain, joint swelling, arthralgias and gait problem.  ?Skin: No rash.  ?Neurological: Denies dizziness, seizures, syncope, weakness, light-headedness, numbness and headaches.  ?Hematological: Denies adenopathy. Easy bruising, personal or family bleeding history  ?Psychiatric/Behavioral: Has anxiety or depression ? ?  ? ?Physical Exam:   ? ?BP (!) 150/90   Pulse 70   Ht '5\' 1"'$  (1.549 m)   Wt 163 lb 2 oz (74 kg)   SpO2 98%   BMI 30.82 kg/m?  ?Wt Readings from Last 3 Encounters:  ?02/27/22 163 lb 2 oz (74 kg)  ?01/09/22 161 lb 6.4 oz (73.2 kg)  ?10/31/21 165 lb 12 oz (75.2 kg)  ? ?Constitutional:  Well-developed, in no acute distress. ?Psychiatric: Normal mood and affect. Behavior is normal. ?HEENT: Conjunctivae are normal. No scleral icterus. ?Cardiovascular: Normal rate, regular rhythm. No edema ?Pulmonary/chest: Effort normal and breath sounds normal. No wheezing, rales or rhonchi. ?Abdominal: Soft, nondistended.  Mild left flank tenderness and left upper quadrant tenderness without rebound.  Bowel sounds active throughout. There are no masses palpable. No hepatomegaly. ?Rectal: Deferred ?Neurological: Alert and oriented to person place and time. ?Skin: Skin is warm and dry. No rashes noted. ? ?Data Reviewed:  I have personally reviewed following labs and imaging studies ? ?CBC: ?CBC Latest Ref Rng & Units 01/09/2022 02/10/2021 08/09/2020  ?WBC 3.8 - 10.8 Thousand/uL 5.9 4.4 6.5  ?Hemoglobin 11.7 - 15.5 g/dL 13.5 13.8 14.0  ?Hematocrit 35.0 - 45.0 % 40.7 41.5 43.0  ?Platelets 140 - 400 Thousand/uL 323 272 283.0  ? ? ?CMP: ?CMP Latest Ref Rng & Units 01/09/2022 02/10/2021 08/09/2020  ?Glucose 65 - 99 mg/dL 88 81 88  ?BUN 7 - 25 mg/dL '15 13 11  '$ ?Creatinine 0.60 - 1.00 mg/dL 0.59(L) 0.62 0.53  ?Sodium 135 - 146 mmol/L 135 137  134(L)  ?Potassium 3.5 - 5.3 mmol/L 3.9 3.6 3.5  ?Chloride 98 - 110 mmol/L 100 102 99  ?CO2 20 - 32 mmol/L '28 25 28  '$ ?Calcium 8.6 - 10.4 mg/dL 10.8(H) 10.3 10.7(H)  ?Total Protein 6.1 - 8.1 g/dL - 6.9 -  ?Total Bilirubin 0.2 - 1.2 mg/dL - 0.4 -  ?Alkaline Phos 39 - 117 U/L - - -  ?AST 10 - 35 U/L - 19 -  ?ALT 6 - 29 U/L - 16 -  ? ? ? ? ? ? ?Carmell Austria, MD 02/27/2022, 1:42 PM ? ?Cc: Kuneff, Renee A, DO ? ? ?

## 2022-03-05 ENCOUNTER — Other Ambulatory Visit: Payer: Self-pay

## 2022-03-05 ENCOUNTER — Telehealth: Payer: Self-pay | Admitting: Gastroenterology

## 2022-03-05 MED ORDER — POTASSIUM CHLORIDE CRYS ER 20 MEQ PO TBCR: 20.0000 meq | EXTENDED_RELEASE_TABLET | Freq: Every day | ORAL | 0 refills | Status: DC

## 2022-03-05 NOTE — Telephone Encounter (Signed)
Done

## 2022-03-05 NOTE — Telephone Encounter (Signed)
Patient returned your call.  She said to please call on her home phone number and if she doesn't answer it's OK to leave her a detailed voicemail message.  Thank you. ?

## 2022-03-07 ENCOUNTER — Telehealth (HOSPITAL_BASED_OUTPATIENT_CLINIC_OR_DEPARTMENT_OTHER): Payer: Self-pay

## 2022-03-12 ENCOUNTER — Telehealth (HOSPITAL_BASED_OUTPATIENT_CLINIC_OR_DEPARTMENT_OTHER): Payer: Self-pay

## 2022-03-12 NOTE — Telephone Encounter (Signed)
Dr Lyndel Safe, this patient said that she will do the over the counter potassium because she got an upset stomach and diarrhea from take the script of potassium 38mq tablets. And she would like to know if she can do a different imaging besides the CT because she doesn't want to drink the contrast since she is afraid it will give her diarrhea. Please advise ?

## 2022-03-12 NOTE — Telephone Encounter (Signed)
Patient calling back requesting a call please in regards to the medication that was prescribed to her. ?

## 2022-03-13 NOTE — Telephone Encounter (Signed)
Made patient aware and she said she would think it over and will call when she is ready. She has the imaging number as well and know it has been authorized  ?

## 2022-03-13 NOTE — Telephone Encounter (Signed)
CT would give [place] more information rather than any other imaging for her condition ?Please tell her that diarrhea will be very short lasting after contrast ?RG ?

## 2022-04-16 ENCOUNTER — Ambulatory Visit (HOSPITAL_BASED_OUTPATIENT_CLINIC_OR_DEPARTMENT_OTHER): Payer: Medicare HMO

## 2022-04-19 ENCOUNTER — Other Ambulatory Visit (HOSPITAL_BASED_OUTPATIENT_CLINIC_OR_DEPARTMENT_OTHER): Payer: Medicare HMO

## 2022-04-30 NOTE — Progress Notes (Signed)
? ?Follow Up Note ? ?RE: Shelly Leonard MRN: [id number] DOB: 05/14/1946 ?Date of Office Visit: 05/01/2022 ? ?Referring provider: [name], DO ?Primary care provider: [name], DO ? ?Chief Complaint: Allergic Rhinitis  (Congestion, headaches, runny nose, sneezing with allergies. Has been taking flonase) ? ?History of Present Illness: ?I had the pleasure of seeing Shelly Leonard for a follow up visit at the [facility] on 05/01/2022. She is a 76 y.o. female, who is being followed for allergic rhinitis, anosmia. Her previous allergy office visit was on 10/31/2021 with Althea Charon FNP. Today is a regular follow up visit. ? ?Seasonal and perennial allergic rhinitis ?Currently having nasal congestion and headaches for the past 1 week. No drainage.  ?No fevers/chills. ? ?Currently taking Flonase 2 sprays once a day, sudafed prn. ?Shelly Leonard was not covered. ? ?Not interested in going to see ENT right now.  ? ?Sense of taste has returned. ?Sense of smell is unchanged.  ?  ?Assessment and Plan: ?Shelly Leonard is a 76 y.o. female with: ?Seasonal and perennial allergic rhinitis ?Past history - 2022 skin testing was positive to dust mites and tree pollen. Xhance not covered. ?Interim history - still has nasal congestion. No interested in ENT evaluation.  ?Continue environmental control measures as below. ?Get dust mite covers for your pillows.  ?Use Flonase (fluticasone) nasal spray 1 spray per nostril twice a day as needed for nasal congestion.  ?Nasal saline spray (i.e., Simply Saline) or nasal saline lavage (i.e., NeilMed) is recommended as needed and prior to medicated nasal sprays. ?Use over the counter antihistamines such as Zyrtec (cetirizine), Claritin (loratadine), Allegra (fexofenadine), or Xyzal (levocetirizine) daily as needed. May switch antihistamines every few months. Samples given. ?If you are really congested - you may use sudafed or afrin prn for a few days at a time only.   ? ?Anosmia ?Unchanged. Not interested in ENT evaluation.  ?See assessment and plan as above. ? ?Return in about 6 months (around 11/01/2022). ? ?No orders of the defined types were placed in this encounter. ? ?Lab Orders  ?No laboratory test(s) ordered today  ? ? ?Diagnostics: ?None.  ? ?Medication List:  ?Current Outpatient Medications  ?Medication Sig Dispense Refill  ? aspirin EC 81 MG tablet Take 81 mg by mouth daily.    ? Cholecalciferol (VITAMIN D PO) Take by mouth.    ? fluticasone (FLONASE) 50 MCG/ACT nasal spray Place 1 spray into both nostrils 2 (two) times daily as needed (nasal congestion). 16 g 5  ? Garlic 811 MG TABS Take 100 mg by mouth daily with breakfast.     ? meloxicam (MOBIC) 15 MG tablet Take 1 tablet (15 mg total) by mouth daily. 90 tablet 1  ? Omega-3 Fatty Acids (OMEGA 3 PO) Take 1 capsule by mouth daily.    ? pantoprazole (PROTONIX) 20 MG tablet Take 1 tablet (20 mg total) by mouth daily as needed. 90 tablet 1  ? potassium chloride SA (KLOR-CON M) 20 MEQ tablet Take 1 tablet (20 mEq total) by mouth daily. For more refills please call your Primary Care Doctor. No more refills from GI 30 tablet 0  ? Probiotic Product (PROBIOTIC DAILY PO) Take by mouth.    ? tizanidine (ZANAFLEX) 2 MG capsule Take 1 capsule (2 mg total) by mouth 2 (two) times daily as needed for muscle spasms. 180 capsule 1  ? triamterene-hydrochlorothiazide (MAXZIDE) 75-50 MG tablet Take 1 tablet by mouth daily. 90 tablet 1  ?  verapamil (CALAN) 120 MG tablet Take 1 tablet (120 mg total) by mouth 2 (two) times daily. 180 tablet 1  ? ?No current facility-administered medications for this visit.  ? ?Allergies: ?No Known Allergies ?I reviewed her past medical history, social history, family history, and environmental history and no significant changes have been reported from her previous visit. ? ?Review of Systems  ?Constitutional:  Negative for appetite change, chills, fever and unexpected weight change.  ?HENT:  Positive for  congestion and sinus pressure. Negative for rhinorrhea.   ?Eyes:  Negative for itching.  ?Respiratory:  Negative for cough, chest tightness, shortness of breath and wheezing.   ?Gastrointestinal:  Negative for abdominal pain.  ?Skin:  Negative for rash.  ?Allergic/Immunologic: Positive for environmental allergies.  ?Neurological:  Positive for headaches.  ? ?Objective: ?BP 136/86   Pulse 68   Temp 98 ?F (36.7 ?C)   Resp 18   Ht '5\' 2"'$  (1.575 m)   Wt 164 lb (74.4 kg)   SpO2 97%   BMI 30.00 kg/m?  ?Body mass index is 30 kg/m?[name] Kitchen ?Physical Exam ?Vitals and nursing note reviewed.  ?Constitutional:   ?   Appearance: Normal appearance. She is well-developed.  ?HENT:  ?   Head: Normocephalic and atraumatic.  ?   Right Ear: Tympanic membrane and external ear normal.  ?   Left Ear: Tympanic membrane and external ear normal.  ?   Nose: Nose normal.  ?   Mouth/Throat:  ?   Mouth: Mucous membranes are moist.  ?   Pharynx: Oropharynx is clear.  ?Eyes:  ?   Conjunctiva/sclera: Conjunctivae normal.  ?Cardiovascular:  ?   Rate and Rhythm: Normal rate and regular rhythm.  ?   Heart sounds: Normal heart sounds. No murmur heard. ?Pulmonary:  ?   Effort: Pulmonary effort is normal.  ?   Breath sounds: Normal breath sounds. No wheezing, rhonchi or rales.  ?Musculoskeletal:  ?   Cervical back: Neck supple.  ?Skin: ?   General: Skin is warm.  ?   Findings: No rash.  ?Neurological:  ?   Mental Status: She is alert and oriented to person, place, and time.  ?Psychiatric:     ?   Behavior: Behavior normal.  ? ?Previous notes and tests were reviewed. ?The plan was reviewed with the patient/family, and all questions/concerned were addressed. ? ?It was my pleasure to see Shelly Leonard today and participate in her care. Please feel free to contact me with any questions or concerns. ? ?Sincerely, ? ?Rexene Alberts, DO ?Allergy & Immunology ? ?[facility] ?[place] office: [phone] ?[place] office: [phone] ?

## 2022-05-01 ENCOUNTER — Encounter: Payer: Self-pay | Admitting: Allergy

## 2022-05-01 ENCOUNTER — Other Ambulatory Visit: Payer: Self-pay

## 2022-05-01 ENCOUNTER — Ambulatory Visit: Payer: Medicare HMO | Admitting: Allergy

## 2022-05-01 VITALS — BP 136/86 | HR 68 | Temp 98.0°F | Resp 18 | Ht 62.0 in | Wt 164.0 lb

## 2022-05-01 DIAGNOSIS — R430 Anosmia: Secondary | ICD-10-CM | POA: Diagnosis not present

## 2022-05-01 DIAGNOSIS — J3089 Other allergic rhinitis: Secondary | ICD-10-CM

## 2022-05-01 DIAGNOSIS — J302 Other seasonal allergic rhinitis: Secondary | ICD-10-CM

## 2022-05-01 NOTE — Assessment & Plan Note (Signed)
Past history - 2022 skin testing was positive to dust mites and tree pollen. Xhance not covered. ?Interim history - still has nasal congestion. No interested in ENT evaluation.  ?? Continue environmental control measures as below. ?? Get dust mite covers for your pillows.  ?? Use Flonase (fluticasone) nasal spray 1 spray per nostril twice a day as needed for nasal congestion.  ?? Nasal saline spray (i.e., Simply Saline) or nasal saline lavage (i.e., NeilMed) is recommended as needed and prior to medicated nasal sprays. ?? Use over the counter antihistamines such as Zyrtec (cetirizine), Claritin (loratadine), Allegra (fexofenadine), or Xyzal (levocetirizine) daily as needed. May switch antihistamines every few months. Samples given. ?? If you are really congested - you may use sudafed or afrin prn for a few days at a time only.  ?

## 2022-05-01 NOTE — Patient Instructions (Signed)
Environmental allergies ?2022 skin testing was positive to dust mites and tree pollen. ?Continue environmental control measures as below. ?Get dust mite covers for your pillows.  ?Use Flonase (fluticasone) nasal spray 1 spray per nostril twice a day as needed for nasal congestion.  ?Nasal saline spray (i.e., Simply Saline) or nasal saline lavage (i.e., NeilMed) is recommended as needed and prior to medicated nasal sprays. ? ?Use over the counter antihistamines such as Zyrtec (cetirizine), Claritin (loratadine), Allegra (fexofenadine), or Xyzal (levocetirizine) daily as needed. May switch antihistamines every few months. Samples given. ?If you are really congested - you may use sudafed or afrin for a few days at a time only.  ? ?Follow up in 6 months or sooner if needed.   ? ?Control of House Dust Mite Allergen ?Dust mite allergens are a common trigger of allergy and asthma symptoms. While they can be found throughout the house, these microscopic creatures thrive in warm, humid environments such as bedding, upholstered furniture and carpeting. ?Because so much time is spent in the bedroom, it is essential to reduce mite levels there.  ?Encase pillows, mattresses, and box springs in special allergen-proof fabric covers or airtight, zippered plastic covers.  ?Bedding should be washed weekly in hot water (130? F) and dried in a hot dryer. Allergen-proof covers are available for comforters and pillows that can?t be regularly washed.  ?Wash the allergy-proof covers every few months. Minimize clutter in the bedroom. Keep pets out of the bedroom.  ?Keep humidity less than 50% by using a dehumidifier or air conditioning. You can buy a humidity measuring device called a hygrometer to monitor this.  ?If possible, replace carpets with hardwood, linoleum, or washable area rugs. If that's not possible, vacuum frequently with a vacuum that has a HEPA filter. ?Remove all upholstered furniture and non-washable window drapes from the  bedroom. ?Remove all non-washable stuffed toys from the bedroom.  Wash stuffed toys weekly. ?Reducing Pollen Exposure ?Pollen seasons: trees (spring), grass (summer) and ragweed/weeds (fall). ?Keep windows closed in your home and car to lower pollen exposure.  ?Install air conditioning in the bedroom and throughout the house if possible.  ?Avoid going out in dry windy days - especially early morning. ?Pollen counts are highest between 5 - 10 AM and on dry, hot and windy days.  ?Save outside activities for late afternoon or after a heavy rain, when pollen levels are lower.  ?Avoid mowing of grass if you have grass pollen allergy. ?Be aware that pollen can also be transported indoors on people and pets.  ?Dry your clothes in an automatic dryer rather than hanging them outside where they might collect pollen.  ?Rinse hair and eyes before bedtime. ? ?

## 2022-05-01 NOTE — Assessment & Plan Note (Signed)
Unchanged. Not interested in ENT evaluation.  ?? See assessment and plan as above. ?

## 2022-06-19 ENCOUNTER — Ambulatory Visit: Payer: Medicare HMO | Admitting: Family Medicine

## 2022-06-29 ENCOUNTER — Ambulatory Visit: Payer: Medicare HMO | Admitting: Family Medicine

## 2022-07-13 DIAGNOSIS — H5203 Hypermetropia, bilateral: Secondary | ICD-10-CM | POA: Diagnosis not present

## 2022-07-13 DIAGNOSIS — H524 Presbyopia: Secondary | ICD-10-CM | POA: Diagnosis not present

## 2022-07-13 DIAGNOSIS — H16223 Keratoconjunctivitis sicca, not specified as Sjogren's, bilateral: Secondary | ICD-10-CM | POA: Diagnosis not present

## 2022-07-13 DIAGNOSIS — H2513 Age-related nuclear cataract, bilateral: Secondary | ICD-10-CM | POA: Diagnosis not present

## 2022-07-18 ENCOUNTER — Other Ambulatory Visit: Payer: Self-pay | Admitting: Family Medicine

## 2022-07-18 DIAGNOSIS — I10 Essential (primary) hypertension: Secondary | ICD-10-CM

## 2022-07-18 DIAGNOSIS — H524 Presbyopia: Secondary | ICD-10-CM | POA: Diagnosis not present

## 2022-07-20 ENCOUNTER — Telehealth: Payer: Self-pay

## 2022-07-20 NOTE — Telephone Encounter (Signed)
Patient is scheduled to have her mammogram done on 9/19, and would like to have her bone scan done all the same day. Patient would like to have bone scan at [facility]  Patient can be reached at [phone]

## 2022-07-20 NOTE — Telephone Encounter (Signed)
Please advise. Pt last AWV was 08/30/21

## 2022-07-23 NOTE — Telephone Encounter (Signed)
Called pt and informed her per last DEXA scan pt will need order next year.  [place], DO  08/04/2020  9:43 AM EDT     Please inform pt: Her mammogram is normal.  Her bone density is stable from prior results> still considered osteopenic> but stable.  Rpt bone density in 3 years

## 2022-07-31 ENCOUNTER — Ambulatory Visit (INDEPENDENT_AMBULATORY_CARE_PROVIDER_SITE_OTHER): Payer: Medicare HMO | Admitting: Family Medicine

## 2022-07-31 ENCOUNTER — Encounter: Payer: Self-pay | Admitting: Family Medicine

## 2022-07-31 VITALS — BP 98/64 | HR 57 | Temp 98.2°F | Ht 61.0 in | Wt 162.0 lb

## 2022-07-31 DIAGNOSIS — I10 Essential (primary) hypertension: Secondary | ICD-10-CM

## 2022-07-31 DIAGNOSIS — K219 Gastro-esophageal reflux disease without esophagitis: Secondary | ICD-10-CM | POA: Diagnosis not present

## 2022-07-31 DIAGNOSIS — E559 Vitamin D deficiency, unspecified: Secondary | ICD-10-CM

## 2022-07-31 DIAGNOSIS — M5410 Radiculopathy, site unspecified: Secondary | ICD-10-CM

## 2022-07-31 DIAGNOSIS — M8580 Other specified disorders of bone density and structure, unspecified site: Secondary | ICD-10-CM | POA: Diagnosis not present

## 2022-07-31 MED ORDER — POTASSIUM CHLORIDE CRYS ER 20 MEQ PO TBCR: 20.0000 meq | EXTENDED_RELEASE_TABLET | Freq: Every day | ORAL | 1 refills | Status: DC

## 2022-07-31 MED ORDER — VERAPAMIL HCL 120 MG PO TABS: 120.0000 mg | ORAL_TABLET | Freq: Two times a day (BID) | ORAL | 1 refills | Status: DC

## 2022-07-31 MED ORDER — PANTOPRAZOLE SODIUM 20 MG PO TBEC: 20.0000 mg | DELAYED_RELEASE_TABLET | Freq: Every day | ORAL | 1 refills | Status: DC | PRN

## 2022-07-31 MED ORDER — TRIAMTERENE-HCTZ 37.5-25 MG PO TABS: 1.0000 | ORAL_TABLET | Freq: Every day | ORAL | 1 refills | Status: DC

## 2022-07-31 MED ORDER — MELOXICAM 15 MG PO TABS: 15.0000 mg | ORAL_TABLET | Freq: Every day | ORAL | 1 refills | Status: DC

## 2022-07-31 MED ORDER — TIZANIDINE HCL 2 MG PO CAPS: 2.0000 mg | ORAL_CAPSULE | Freq: Two times a day (BID) | ORAL | 1 refills | Status: DC | PRN

## 2022-07-31 NOTE — Progress Notes (Signed)
Patient Care Team    Relationship Specialty Notifications Start End  [name], DO PCP - General Family Medicine  08/18/15   Warden Fillers, MD Consulting Physician Ophthalmology  05/08/16   Milus Banister, MD Attending Physician Gastroenterology  05/08/16   Thornell Sartorius, MD Consulting Physician Otolaryngology  06/25/17   Manson Passey, Emerge  Specialist  07/01/18   Madelin Rear, MD (Inactive) Consulting Physician Endocrinology  01/28/20     Chief Complaint  Patient presents with   Hypertension    Cmc; pt is not fasting    History of Present Ilness: Shelly Leonard, 76 y.o. , female presents today for [facility]  Essential hypertension, benign/morbid obesity Pt reports compliance with Maxzide 75-50 and verapamil 120 mg BID. Blood pressures ranges at home not routinely checked. Patient denies chest pain, shortness of breath, dizziness or lower extremity edema.  Pt takes a daily baby ASA. Pt is not prescribed statin. Labs due Diet: Low sodium. Exercise: Routine exercise. RF: Hypertension, obesity, family history of heart disease   Gastroesophageal reflux disease, esophagitis presence not specified Patient reports compliance with Protonix 20 mg daily.   Medicine is working well for her   Hypercalcemia/Thyroid nodule She does have multiple nodules within the thyroid.  Her biopsy was negative for any type of malignancy.  SHe has started to increase her water consumption daily. She is following with endocrinology.      Past Medical History:  Diagnosis Date   Allergy    Anemia    low iron (hgb 8)   Arthritis    knees and hands    Carpal tunnel syndrome    Cataract    Diverticulitis large intestine w/o perforation or abscess w/bleeding    GERD (gastroesophageal reflux disease)    Headache(784.0)    occasional    Hypertension    Lumbar spondylosis    Osteopenia    PONV (postoperative nausea and vomiting)    Vertigo    Vitamin D deficiency    All allergies  reviewed No Known Allergies Past Surgical History:  Procedure Laterality Date   BREAST REDUCTION SURGERY  1982   CARPAL TUNNEL RELEASE     left    COLONOSCOPY     FOOT SURGERY     KNEE ARTHROSCOPY     right knee 2012    OTHER SURGICAL HISTORY     spur removed top of left foot    REDUCTION MAMMAPLASTY  1982   TOTAL KNEE ARTHROPLASTY  04/21/2012   Procedure: TOTAL KNEE ARTHROPLASTY;  Surgeon: [name], MD;  Location: [facility];  Service: Orthopedics;  Laterality: Right;   VAGINAL HYSTERECTOMY  1977   Family History  Problem Relation Age of Onset   Breast cancer Mother        late in life   Diabetes Mother        late in life   Heart disease Father    Lung disease Father    Colon cancer Neg Hx    Esophageal cancer Neg Hx    Stomach cancer Neg Hx    Rectal cancer Neg Hx    Social History   Social History Narrative   Ms Muha is widowed (12/2016).    She enjoys music and is involved with her church.    All medications verified Allergies as of 07/31/2022   No Known Allergies      Medication List        Accurate as of July 31, 2022  2:24 PM. If you have any questions, ask your nurse or doctor.          STOP taking these medications    triamterene-hydrochlorothiazide 75-50 MG tablet Commonly known as: MAXZIDE Replaced by: triamterene-hydrochlorothiazide 37.5-25 MG tablet Stopped by: Howard Pouch, DO       TAKE these medications    aspirin EC 81 MG tablet Take 81 mg by mouth daily.   fluticasone 50 MCG/ACT nasal spray Commonly known as: Flonase Place 1 spray into both nostrils 2 (two) times daily as needed (nasal congestion).   Garlic 161 MG Tabs Take 100 mg by mouth daily with breakfast.   meloxicam 15 MG tablet Commonly known as: MOBIC Take 1 tablet (15 mg total) by mouth daily.   OMEGA 3 PO Take 1 capsule by mouth daily.   pantoprazole 20 MG tablet Commonly known as: PROTONIX Take 1 tablet (20 mg total) by mouth daily as needed.    potassium chloride SA 20 MEQ tablet Commonly known as: KLOR-CON M Take 1 tablet (20 mEq total) by mouth daily. For more refills please call your Primary Care Doctor. No more refills from GI   PROBIOTIC DAILY PO Take by mouth.   tizanidine 2 MG capsule Commonly known as: Zanaflex Take 1 capsule (2 mg total) by mouth 2 (two) times daily as needed for muscle spasms.   triamterene-hydrochlorothiazide 37.5-25 MG tablet Commonly known as: MAXZIDE-25 Take 1 tablet by mouth daily. Replaces: triamterene-hydrochlorothiazide 75-50 MG tablet Started by: Howard Pouch, DO   verapamil 120 MG tablet Commonly known as: CALAN Take 1 tablet (120 mg total) by mouth 2 (two) times daily.   VITAMIN D PO Take by mouth.   Xiidra 5 % Soln Generic drug: Lifitegrast Apply 1 drop to eye 2 (two) times daily.           07/31/2022    2:00 PM 08/30/2021   10:19 AM 02/10/2021    1:07 PM 08/09/2020    1:03 PM 08/04/2019    2:08 PM  Depression screen PHQ 2/9  Decreased Interest 0 0 0 0 0  Down, Depressed, Hopeless 0 0 0 1 0  PHQ - 2 Score 0 0 0 1 0       No data to display           Immunizations: Immunization History  Administered Date(s) Administered   PFIZER(Purple Top)SARS-COV-2 Vaccination 03/17/2020, 04/12/2020, 11/30/2020   Pfizer Covid-19 Vaccine Bivalent Booster 61yr & up 10/25/2021   Pneumococcal Conjugate-13 04/01/2014   Pneumococcal Polysaccharide-23 11/29/2015   Td 08/14/2011   Tdap 03/14/2022   Zoster, Live 01/20/2013   Objective:  BP 98/64   Pulse (!) 57   Temp 98.2 F (36.8 C) (Oral)   Ht '5\' 1"'$  (1.549 m)   Wt 162 lb (73.5 kg)   SpO2 96%   BMI 30.61 kg/m  Physical Exam Vitals and nursing note reviewed.  Constitutional:      General: She is not in acute distress.    Appearance: Normal appearance. She is obese. She is not ill-appearing, toxic-appearing or diaphoretic.  HENT:     Head: Normocephalic and atraumatic.     Mouth/Throat:     Mouth: Mucous membranes are  moist.  Eyes:     General: No scleral icterus.       Right eye: No discharge.        Left eye: No discharge.     Extraocular Movements: Extraocular movements intact.     Conjunctiva/sclera: Conjunctivae normal.  Pupils: Pupils are equal, round, and reactive to light.  Cardiovascular:     Rate and Rhythm: Normal rate and regular rhythm.  Pulmonary:     Effort: Pulmonary effort is normal. No respiratory distress.     Breath sounds: Normal breath sounds. No wheezing, rhonchi or rales.  Musculoskeletal:     Cervical back: Neck supple. No tenderness.     Right lower leg: No edema.     Left lower leg: No edema.  Lymphadenopathy:     Cervical: No cervical adenopathy.  Skin:    General: Skin is warm and dry.     Coloration: Skin is not jaundiced or pale.     Findings: No erythema or rash.  Neurological:     Mental Status: She is alert and oriented to person, place, and time. Mental status is at baseline.     Motor: No weakness.     Gait: Gait normal.  Psychiatric:        Mood and Affect: Mood normal.        Behavior: Behavior normal.        Thought Content: Thought content normal.        Judgment: Judgment normal.     Assessment and Plan :Shelly Leonard is a 76 y.o. female present for [facility] and fatigue Essential hypertension, benign Stable.  Continue Maxide at lower dose 37.5-25 continue  verapamil BID -Low-sodium diet, exercise encouraged. LABS: due next visit    Gastroesophageal reflux disease, esophagitis presence not specified/long term use of ppi Stable Continue  Protonix. Vitd, b12 and mag yearly   Hypercalcemia/Thyroid nodule - established with endocrine. Continue routine follow-ups. -Biopsy was benign.   -Continue to work on adequate hydration.   Chronic back pain: Stable continue Zanaflex 2 mg twice daily as needed. Encouraged her to take before bed.  Patient reports she rarely needs. Continue mobic qd  Meds ordered this encounter  Medications    meloxicam (MOBIC) 15 MG tablet    Sig: Take 1 tablet (15 mg total) by mouth daily.    Dispense:  90 tablet    Refill:  1   tizanidine (ZANAFLEX) 2 MG capsule    Sig: Take 1 capsule (2 mg total) by mouth 2 (two) times daily as needed for muscle spasms.    Dispense:  180 capsule    Refill:  1   verapamil (CALAN) 120 MG tablet    Sig: Take 1 tablet (120 mg total) by mouth 2 (two) times daily.    Dispense:  180 tablet    Refill:  1   pantoprazole (PROTONIX) 20 MG tablet    Sig: Take 1 tablet (20 mg total) by mouth daily as needed.    Dispense:  90 tablet    Refill:  1   potassium chloride SA (KLOR-CON M) 20 MEQ tablet    Sig: Take 1 tablet (20 mEq total) by mouth daily. For more refills please call your Primary Care Doctor. No more refills from GI    Dispense:  90 tablet    Refill:  1   triamterene-hydrochlorothiazide (MAXZIDE-25) 37.5-25 MG tablet    Sig: Take 1 tablet by mouth daily.    Dispense:  90 tablet    Refill:  1   No orders of the defined types were placed in this encounter.   Electronically Signed by: Howard Pouch, DO [place]

## 2022-08-08 ENCOUNTER — Other Ambulatory Visit (HOSPITAL_BASED_OUTPATIENT_CLINIC_OR_DEPARTMENT_OTHER): Payer: Self-pay | Admitting: Family Medicine

## 2022-08-08 DIAGNOSIS — Z1231 Encounter for screening mammogram for malignant neoplasm of breast: Secondary | ICD-10-CM

## 2022-08-16 ENCOUNTER — Ambulatory Visit: Payer: Medicare HMO | Admitting: Nurse Practitioner

## 2022-08-23 ENCOUNTER — Telehealth: Payer: Self-pay | Admitting: Family Medicine

## 2022-08-23 NOTE — Telephone Encounter (Signed)
Pt called and is experiencing some sinus related issues. Her head is stopped up and very congested. She reports that Dr. Raoul Pitch is aware of this issue and ask if someone could call her in something.

## 2022-08-24 NOTE — Telephone Encounter (Signed)
Offered pt appt with [facility] pt declined due to traffic will CB after weekend if sx dont improve

## 2022-08-30 ENCOUNTER — Ambulatory Visit (INDEPENDENT_AMBULATORY_CARE_PROVIDER_SITE_OTHER): Payer: Medicare HMO | Admitting: Allergy

## 2022-08-30 ENCOUNTER — Encounter: Payer: Self-pay | Admitting: Allergy

## 2022-08-30 VITALS — BP 130/70 | HR 61 | Temp 97.8°F | Resp 18

## 2022-08-30 DIAGNOSIS — R430 Anosmia: Secondary | ICD-10-CM | POA: Diagnosis not present

## 2022-08-30 DIAGNOSIS — J3089 Other allergic rhinitis: Secondary | ICD-10-CM

## 2022-08-30 DIAGNOSIS — J302 Other seasonal allergic rhinitis: Secondary | ICD-10-CM

## 2022-08-30 NOTE — Patient Instructions (Addendum)
Environmental allergies 2022 skin testing was positive to dust mites and tree pollen. Start prednisone taper. Prednisone '10mg'$  tablets - take 2 tablets for 4 days then 1 tablet on day 5.  If you notice discolored discharge or fevers let us know and will send in antibiotics then.   Continue environmental control measures as below. Use Flonase (fluticasone) nasal spray 1 spray per nostril twice a day as needed for nasal congestion.  Nasal saline spray (i.e., Simply Saline) or nasal saline lavage (i.e., NeilMed) is recommended as needed and prior to medicated nasal sprays.  Use over the counter antihistamines such as Zyrtec (cetirizine), Claritin (loratadine), Allegra (fexofenadine), or Xyzal (levocetirizine) daily as needed. May switch antihistamines every few months. Samples given. If you are really congested - you may use sudafed or afrin for a few days at a time only.   Follow up in 6 months or sooner if needed.    Control of House Dust Mite Allergen Dust mite allergens are a common trigger of allergy and asthma symptoms. While they can be found throughout the house, these microscopic creatures thrive in warm, humid environments such as bedding, upholstered furniture and carpeting. Because so much time is spent in the bedroom, it is essential to reduce mite levels there.  Encase pillows, mattresses, and box springs in special allergen-proof fabric covers or airtight, zippered plastic covers.  Bedding should be washed weekly in hot water (130 F) and dried in a hot dryer. Allergen-proof covers are available for comforters and pillows that can't be regularly washed.  Wash the allergy-proof covers every few months. Minimize clutter in the bedroom. Keep pets out of the bedroom.  Keep humidity less than 50% by using a dehumidifier or air conditioning. You can buy a humidity measuring device called a hygrometer to monitor this.  If possible, replace carpets with hardwood, linoleum, or washable area  rugs. If that's not possible, vacuum frequently with a vacuum that has a HEPA filter. Remove all upholstered furniture and non-washable window drapes from the bedroom. Remove all non-washable stuffed toys from the bedroom.  Wash stuffed toys weekly. Reducing Pollen Exposure Pollen seasons: trees (spring), grass (summer) and ragweed/weeds (fall). Keep windows closed in your home and car to lower pollen exposure.  Install air conditioning in the bedroom and throughout the house if possible.  Avoid going out in dry windy days - especially early morning. Pollen counts are highest between 5 - 10 AM and on dry, hot and windy days.  Save outside activities for late afternoon or after a heavy rain, when pollen levels are lower.  Avoid mowing of grass if you have grass pollen allergy. Be aware that pollen can also be transported indoors on people and pets.  Dry your clothes in an automatic dryer rather than hanging them outside where they might collect pollen.  Rinse hair and eyes before bedtime.

## 2022-08-30 NOTE — Assessment & Plan Note (Signed)
Past history - 2022 skin testing was positive to dust mites and tree pollen. Xhance not covered. Interim history - increased symptoms. Denies fevers/chills or discolored mucous.  Start prednisone taper. Prednisone '10mg'$  tablets - take 2 tablets for 4 days then 1 tablet on day 5.   If you notice discolored discharge or fevers let us know and will send in antibiotics then.   Continue environmental control measures as below.  Use Flonase (fluticasone) nasal spray 1 spray per nostril twice a day as needed for nasal congestion.   Nasal saline spray (i.e., Simply Saline) or nasal saline lavage (i.e., NeilMed) is recommended as needed and prior to medicated nasal sprays.  Use over the counter antihistamines such as Zyrtec (cetirizine), Claritin (loratadine), Allegra (fexofenadine), or Xyzal (levocetirizine) daily as needed. May switch antihistamines every few months. Samples given.  If you are really congested - you may use sudafed or afrin for a few days at a time only.

## 2022-08-30 NOTE — Progress Notes (Signed)
Follow Up Note  RE: Shelly Leonard MRN: [id number] DOB: [birth year] Date of Office Visit: 08/30/2022  Referring provider: [name], DO Primary care provider: [name], DO  Chief Complaint: Allergies (Can't seem to clear a nose, she blows and stays congested, she has had a headache for the last 2 weeks )  History of Present Illness: I had the pleasure of seeing [name] for a follow up visit at the [facility] [place] [facility] [place] on 08/30/2022. She is a 76 y.o. female, who is being followed for allergic rhinitis, anosmia. Her previous allergy office visit was on 05/01/2022 with Dr. Maudie Mercury. Today is a new complaint visit of allergy flare .  Past 2 weeks patient noticed headaches, nasal congestion, rhinorrhea with white discharge. Denies any fevers/chills. No sick contacts.  Currently using Flonase 2 sprays per nostril once a day. Not taking any antihistamines. Using sudafed as needed. Does not like saline rinses.  No recent antibiotics or prednisone.  Assessment and Plan: Oneita is a 76 y.o. female with: Seasonal and perennial allergic rhinitis Past history - 2022 skin testing was positive to dust mites and tree pollen. Xhance not covered. Interim history - increased symptoms. Denies fevers/chills or discolored mucous. Start prednisone taper. Prednisone '10mg'$  tablets - take 2 tablets for 4 days then 1 tablet on day 5.  If you notice discolored discharge or fevers let us know and will send in antibiotics then.  Continue environmental control measures as below. Use Flonase (fluticasone) nasal spray 1 spray per nostril twice a day as needed for nasal congestion.  Nasal saline spray (i.e., Simply Saline) or nasal saline lavage (i.e., NeilMed) is recommended as needed and prior to medicated nasal sprays. Use over the counter antihistamines such as Zyrtec (cetirizine), Claritin (loratadine), Allegra (fexofenadine), or Xyzal (levocetirizine) daily as needed. May switch  antihistamines every few months. Samples given. If you are really congested - you may use sudafed or afrin for a few days at a time only.   Return in about 6 months (around 02/28/2023).  No orders of the defined types were placed in this encounter.  Lab Orders  No laboratory test(s) ordered today    Diagnostics: None.   Medication List:  Current Outpatient Medications  Medication Sig Dispense Refill   aspirin EC 81 MG tablet Take 81 mg by mouth daily.     Cholecalciferol (VITAMIN D PO) Take by mouth.     fluticasone (FLONASE) 50 MCG/ACT nasal spray Place 1 spray into both nostrils 2 (two) times daily as needed (nasal congestion). 16 g 5   Garlic 494 MG TABS Take 100 mg by mouth daily with breakfast.      meloxicam (MOBIC) 15 MG tablet Take 1 tablet (15 mg total) by mouth daily. 90 tablet 1   Omega-3 Fatty Acids (OMEGA 3 PO) Take 1 capsule by mouth daily.     pantoprazole (PROTONIX) 20 MG tablet Take 1 tablet (20 mg total) by mouth daily as needed. 90 tablet 1   potassium chloride SA (KLOR-CON M) 20 MEQ tablet Take 1 tablet (20 mEq total) by mouth daily. For more refills please call your Primary Care Doctor. No more refills from GI 90 tablet 1   Probiotic Product (PROBIOTIC DAILY PO) Take by mouth.     tizanidine (ZANAFLEX) 2 MG capsule Take 1 capsule (2 mg total) by mouth 2 (two) times daily as needed for muscle spasms. 180 capsule 1   triamterene-hydrochlorothiazide (MAXZIDE-25) 37.5-25 MG tablet Take 1  tablet by mouth daily. 90 tablet 1   verapamil (CALAN) 120 MG tablet Take 1 tablet (120 mg total) by mouth 2 (two) times daily. 180 tablet 1   XIIDRA 5 % SOLN Apply 1 drop to eye 2 (two) times daily.     No current facility-administered medications for this visit.   Allergies: No Known Allergies I reviewed her past medical history, social history, family history, and environmental history and no significant changes have been reported from her previous visit.  Review of Systems   Constitutional:  Negative for appetite change, chills, fever and unexpected weight change.  HENT:  Positive for congestion, rhinorrhea and sinus pressure.   Eyes:  Negative for itching.  Respiratory:  Negative for cough, chest tightness, shortness of breath and wheezing.   Gastrointestinal:  Negative for abdominal pain.  Skin:  Negative for rash.  Allergic/Immunologic: Positive for environmental allergies.  Neurological:  Positive for headaches.    Objective: BP 130/70 (BP Location: Right Arm, Patient Position: Sitting, Cuff Size: Large)   Pulse 61   Temp 97.8 F (36.6 C) (Temporal)   Resp 18   SpO2 98%  There is no height or weight on file to calculate BMI. Physical Exam Vitals and nursing note reviewed.  Constitutional:      Appearance: Normal appearance. She is well-developed.  HENT:     Head: Normocephalic and atraumatic.     Right Ear: Tympanic membrane and external ear normal.     Left Ear: Tympanic membrane and external ear normal.     Nose: Nose normal.     Mouth/Throat:     Mouth: Mucous membranes are moist.     Pharynx: Oropharynx is clear.  Eyes:     Conjunctiva/sclera: Conjunctivae normal.  Cardiovascular:     Rate and Rhythm: Normal rate and regular rhythm.     Heart sounds: Normal heart sounds. No murmur heard. Pulmonary:     Effort: Pulmonary effort is normal.     Breath sounds: Normal breath sounds. No wheezing, rhonchi or rales.  Musculoskeletal:     Cervical back: Neck supple.  Skin:    General: Skin is warm.     Findings: No rash.  Neurological:     Mental Status: She is alert and oriented to person, place, and time.  Psychiatric:        Behavior: Behavior normal.   Previous notes and tests were reviewed. The plan was reviewed with the patient/family, and all questions/concerned were addressed.  It was my pleasure to see Shelly Leonard today and participate in her care. Please feel free to contact me with any questions or  concerns.  Sincerely,  Rexene Alberts, DO Allergy & Immunology  [facility] office: [place] office: [phone]

## 2022-08-31 ENCOUNTER — Other Ambulatory Visit: Payer: Self-pay | Admitting: Family Medicine

## 2022-08-31 DIAGNOSIS — I10 Essential (primary) hypertension: Secondary | ICD-10-CM

## 2022-09-05 ENCOUNTER — Ambulatory Visit: Payer: Medicare HMO

## 2022-09-11 ENCOUNTER — Telehealth: Payer: Self-pay

## 2022-09-11 NOTE — Telephone Encounter (Signed)
Left message on patient home number.  Patient missed medicare well visit last week 9/13, I was calling to reschedule appt

## 2022-09-12 ENCOUNTER — Ambulatory Visit (INDEPENDENT_AMBULATORY_CARE_PROVIDER_SITE_OTHER): Payer: Medicare HMO

## 2022-09-12 DIAGNOSIS — Z0000 Encounter for general adult medical examination without abnormal findings: Secondary | ICD-10-CM

## 2022-09-12 NOTE — Patient Instructions (Signed)
Shelly Leonard , Thank you for taking time to come for your Medicare Wellness Visit. I appreciate your ongoing commitment to your health goals. Please review the following plan we discussed and let me know if I can assist you in the future.   Screening recommendations/referrals: Colonoscopy: done 05/29/16 repeat every 10 years  Mammogram: scheduled for 09/19/22 Bone Density: done 08/02/20 repeat every 3 years  Recommended yearly ophthalmology/optometry visit for glaucoma screening and checkup Recommended yearly dental visit for hygiene and checkup  Vaccinations: Influenza vaccine: declined at this time  Pneumococcal vaccine: Up to date Tdap vaccine: done 03/14/22 repeat every 10 years  Shingles vaccine: 1st dose 08/15/22   Covid-19:completed 3/25, 4/20, 11/30/20 & 10/25/21   Advanced directives: Advance directive discussed with you today. Even though you declined this today please call our office should you change your mind and we can give you the proper paperwork for you to fill out.  Conditions/risks identified: maintain current health  Next appointment: Follow up in one year for your annual wellness visit    Preventive Care 65 Years and Older, Female Preventive care refers to lifestyle choices and visits with your health care provider that can promote health and wellness. What does preventive care include? A yearly physical exam. This is also called an annual well check. Dental exams once or twice a year. Routine eye exams. Ask your health care provider how often you should have your eyes checked. Personal lifestyle choices, including: Daily care of your teeth and gums. Regular physical activity. Eating a healthy diet. Avoiding tobacco and drug use. Limiting alcohol use. Practicing safe sex. Taking low-dose aspirin every day. Taking vitamin and mineral supplements as recommended by your health care provider. What happens during an annual well check? The services and screenings done by  your health care provider during your annual well check will depend on your age, overall health, lifestyle risk factors, and family history of disease. Counseling  Your health care provider may ask you questions about your: Alcohol use. Tobacco use. Drug use. Emotional well-being. Home and relationship well-being. Sexual activity. Eating habits. History of falls. Memory and ability to understand (cognition). Work and work Statistician. Reproductive health. Screening  You may have the following tests or measurements: Height, weight, and BMI. Blood pressure. Lipid and cholesterol levels. These may be checked every 5 years, or more frequently if you are over 12 years old. Skin check. Lung cancer screening. You may have this screening every year starting at age 38 if you have a 30-pack-year history of smoking and currently smoke or have quit within the past 15 years. Fecal occult blood test (FOBT) of the stool. You may have this test every year starting at age 17. Flexible sigmoidoscopy or colonoscopy. You may have a sigmoidoscopy every 5 years or a colonoscopy every 10 years starting at age 34. Hepatitis C blood test. Hepatitis B blood test. Sexually transmitted disease (STD) testing. Diabetes screening. This is done by checking your blood sugar (glucose) after you have not eaten for a while (fasting). You may have this done every 1-3 years. Bone density scan. This is done to screen for osteoporosis. You may have this done starting at age 24. Mammogram. This may be done every 1-2 years. Talk to your health care provider about how often you should have regular mammograms. Talk with your health care provider about your test results, treatment options, and if necessary, the need for more tests. Vaccines  Your health care provider may recommend certain vaccines, such  as: Influenza vaccine. This is recommended every year. Tetanus, diphtheria, and acellular pertussis (Tdap, Td) vaccine. You  may need a Td booster every 10 years. Zoster vaccine. You may need this after age 37. Pneumococcal 13-valent conjugate (PCV13) vaccine. One dose is recommended after age 40. Pneumococcal polysaccharide (PPSV23) vaccine. One dose is recommended after age 28. Talk to your health care provider about which screenings and vaccines you need and how often you need them. This information is not intended to replace advice given to you by your health care provider. Make sure you discuss any questions you have with your health care provider. Document Released: 01/06/2016 Document Revised: 08/29/2016 Document Reviewed: 10/11/2015 Elsevier Interactive Patient Education  2017 [place] Prevention in the Home Falls can cause injuries. They can happen to people of all ages. There are many things you can do to make your home safe and to help prevent falls. What can I do on the outside of my home? Regularly fix the edges of walkways and driveways and fix any cracks. Remove anything that might make you trip as you walk through a door, such as a raised step or threshold. Trim any bushes or trees on the path to your home. Use bright outdoor lighting. Clear any walking paths of anything that might make someone trip, such as rocks or tools. Regularly check to see if handrails are loose or broken. Make sure that both sides of any steps have handrails. Any raised decks and porches should have guardrails on the edges. Have any leaves, snow, or ice cleared regularly. Use sand or salt on walking paths during winter. Clean up any spills in your garage right away. This includes oil or grease spills. What can I do in the bathroom? Use night lights. Install grab bars by the toilet and in the tub and shower. Do not use towel bars as grab bars. Use non-skid mats or decals in the tub or shower. If you need to sit down in the shower, use a plastic, non-slip stool. Keep the floor dry. Clean up any water that spills  on the floor as soon as it happens. Remove soap buildup in the tub or shower regularly. Attach bath mats securely with double-sided non-slip rug tape. Do not have throw rugs and other things on the floor that can make you trip. What can I do in the bedroom? Use night lights. Make sure that you have a light by your bed that is easy to reach. Do not use any sheets or blankets that are too big for your bed. They should not hang down onto the floor. Have a firm chair that has side arms. You can use this for support while you get dressed. Do not have throw rugs and other things on the floor that can make you trip. What can I do in the kitchen? Clean up any spills right away. Avoid walking on wet floors. Keep items that you use a lot in easy-to-reach places. If you need to reach something above you, use a strong step stool that has a grab bar. Keep electrical cords out of the way. Do not use floor polish or wax that makes floors slippery. If you must use wax, use non-skid floor wax. Do not have throw rugs and other things on the floor that can make you trip. What can I do with my stairs? Do not leave any items on the stairs. Make sure that there are handrails on both sides of the stairs and use  them. Fix handrails that are broken or loose. Make sure that handrails are as long as the stairways. Check any carpeting to make sure that it is firmly attached to the stairs. Fix any carpet that is loose or worn. Avoid having throw rugs at the top or bottom of the stairs. If you do have throw rugs, attach them to the floor with carpet tape. Make sure that you have a light switch at the top of the stairs and the bottom of the stairs. If you do not have them, ask someone to add them for you. What else can I do to help prevent falls? Wear shoes that: Do not have high heels. Have rubber bottoms. Are comfortable and fit you well. Are closed at the toe. Do not wear sandals. If you use a stepladder: Make  sure that it is fully opened. Do not climb a closed stepladder. Make sure that both sides of the stepladder are locked into place. Ask someone to hold it for you, if possible. Clearly mark and make sure that you can see: Any grab bars or handrails. First and last steps. Where the edge of each step is. Use tools that help you move around (mobility aids) if they are needed. These include: Canes. Walkers. Scooters. Crutches. Turn on the lights when you go into a dark area. Replace any light bulbs as soon as they burn out. Set up your furniture so you have a clear path. Avoid moving your furniture around. If any of your floors are uneven, fix them. If there are any pets around you, be aware of where they are. Review your medicines with your doctor. Some medicines can make you feel dizzy. This can increase your chance of falling. Ask your doctor what other things that you can do to help prevent falls. This information is not intended to replace advice given to you by your health care provider. Make sure you discuss any questions you have with your health care provider. Document Released: 10/06/2009 Document Revised: 05/17/2016 Document Reviewed: 01/14/2015 Elsevier Interactive Patient Education  2017 [employer].

## 2022-09-12 NOTE — Progress Notes (Addendum)
Virtual Visit via Telephone Note  I connected with  [name] on 09/14/22 at 10:15 AM EDT by telephone and verified that I am speaking with the correct person using two identifiers.  Medicare Annual Wellness visit completed telephonically due to Covid-19 pandemic.   Persons participating in this call: This Health Coach and this patient.   Location: Patient: home Provider: Office    I discussed the limitations, risks, security and privacy concerns of performing an evaluation and management service by telephone and the availability of in person appointments. The patient expressed understanding and agreed to proceed.  Unable to perform video visit due to video visit attempted and failed and/or patient does not have video capability.   Some vital signs may be absent or patient reported.   Willette Brace, LPN   Subjective:   LLOYD CULLINAN is a 76 y.o. female who presents for Medicare Annual (Subsequent) preventive examination.  Review of Systems     Cardiac Risk Factors include: advanced age (>56mn, >>15women);obesity (BMI >30kg/m2);hypertension     Objective:    There were no vitals filed for this visit. There is no height or weight on file to calculate BMI.     09/12/2022   10:07 AM 08/30/2021   10:20 AM 08/09/2020    1:00 PM 08/04/2019    2:08 PM 07/01/2018   11:18 AM 06/25/2017   11:10 AM 08/09/2016    5:21 PM  Advanced Directives  Does Patient Have a Medical Advance Directive? No No No No No No No  Would patient like information on creating a medical advance directive? No - Patient declined No - Patient declined No - Patient declined No - Patient declined Yes (MAU/Ambulatory/Procedural Areas - Information given) Yes (MAU/Ambulatory/Procedural Areas - Information given)     Current Medications (verified) Outpatient Encounter Medications as of 09/12/2022  Medication Sig   aspirin EC 81 MG tablet Take 81 mg by mouth daily.   Cholecalciferol (VITAMIN D PO) Take by  mouth.   fluticasone (FLONASE) 50 MCG/ACT nasal spray Place 1 spray into both nostrils 2 (two) times daily as needed (nasal congestion).   Garlic 1176MG TABS Take 100 mg by mouth daily with breakfast.    meloxicam (MOBIC) 15 MG tablet Take 1 tablet (15 mg total) by mouth daily.   Omega-3 Fatty Acids (OMEGA 3 PO) Take 1 capsule by mouth daily.   pantoprazole (PROTONIX) 20 MG tablet Take 1 tablet (20 mg total) by mouth daily as needed.   potassium chloride SA (KLOR-CON M) 20 MEQ tablet Take 1 tablet (20 mEq total) by mouth daily. For more refills please call your Primary Care Doctor. No more refills from GI   Probiotic Product (PROBIOTIC DAILY PO) Take by mouth.   tizanidine (ZANAFLEX) 2 MG capsule Take 1 capsule (2 mg total) by mouth 2 (two) times daily as needed for muscle spasms.   triamterene-hydrochlorothiazide (MAXZIDE-25) 37.5-25 MG tablet Take 1 tablet by mouth daily.   verapamil (CALAN) 120 MG tablet Take 1 tablet (120 mg total) by mouth 2 (two) times daily.   XIIDRA 5 % SOLN Apply 1 drop to eye 2 (two) times daily.   No facility-administered encounter medications on file as of 09/12/2022.    Allergies (verified) Patient has no known allergies.   History: Past Medical History:  Diagnosis Date   Allergy    Anemia    low iron (hgb 8)   Arthritis    knees and hands    Carpal tunnel  syndrome    Cataract    Diverticulitis large intestine w/o perforation or abscess w/bleeding    GERD (gastroesophageal reflux disease)    Headache(784.0)    occasional    Hypertension    Lumbar spondylosis    Osteopenia    PONV (postoperative nausea and vomiting)    Vertigo    Vitamin D deficiency    Past Surgical History:  Procedure Laterality Date   BREAST REDUCTION SURGERY  1982   CARPAL TUNNEL RELEASE     left    COLONOSCOPY     FOOT SURGERY     KNEE ARTHROSCOPY     right knee 2012    OTHER SURGICAL HISTORY     spur removed top of left foot    REDUCTION MAMMAPLASTY  1982   TOTAL  KNEE ARTHROPLASTY  04/21/2012   Procedure: TOTAL KNEE ARTHROPLASTY;  Surgeon: [name], MD;  Location: [facility];  Service: Orthopedics;  Laterality: Right;   VAGINAL HYSTERECTOMY  1977   Family History  Problem Relation Age of Onset   Breast cancer Mother        late in life   Diabetes Mother        late in life   Heart disease Father    Lung disease Father    Colon cancer Neg Hx    Esophageal cancer Neg Hx    Stomach cancer Neg Hx    Rectal cancer Neg Hx    Social History   Socioeconomic History   Marital status: Widowed    Spouse name: Not on file   Number of children: 2   Years of education: Not on file   Highest education level: Not on file  Occupational History   Not on file  Tobacco Use   Smoking status: Never   Smokeless tobacco: Never  Vaping Use   Vaping Use: Never used  Substance and Sexual Activity   Alcohol use: No   Drug use: No   Sexual activity: Yes    Birth control/protection: Post-menopausal  Other Topics Concern   Not on file  Social History Narrative   Ms Cirilo is widowed (12/2016).    She enjoys music and is involved with her church.   Social Determinants of Health   Financial Resource Strain: Low Risk  (09/12/2022)   Overall Financial Resource Strain (CARDIA)    Difficulty of Paying Living Expenses: Not hard at all  Food Insecurity: No Food Insecurity (09/12/2022)   Hunger Vital Sign    Worried About Running Out of Food in the Last Year: Never true    Ran Out of Food in the Last Year: Never true  Transportation Needs: No Transportation Needs (09/12/2022)   PRAPARE - Hydrologist (Medical): No    Lack of Transportation (Non-Medical): No  Physical Activity: Sufficiently Active (09/12/2022)   Exercise Vital Sign    Days of Exercise per Week: 5 days    Minutes of Exercise per Session: 60 min  Stress: No Stress Concern Present (09/12/2022)   [place]    Feeling of Stress : Not at all  Social Connections: Moderately Integrated (09/12/2022)   Social Connection and Isolation Panel [NHANES]    Frequency of Communication with Friends and Family: More than three times a week    Frequency of Social Gatherings with Friends and Family: More than three times a week    Attends Religious Services: More than 4 times  per year    Active Member of Clubs or Organizations: Yes    Attends Archivist Meetings: More than 4 times per year    Marital Status: Widowed    Tobacco Counseling Counseling given: Not Answered   Clinical Intake:  Pre-visit preparation completed: Yes  Pain : No/denies pain     Nutritional Status: BMI > 30  Obese Nutritional Risks: None Diabetes: No  How often do you need to have someone help you when you read instructions, pamphlets, or other written materials from your doctor or pharmacy?: 1 - Never  Diabetic?no  Interpreter Needed?: No  Information entered by :: Charlott Rakes, LPN   Activities of Daily Living    09/12/2022   10:08 AM  In your present state of health, do you have any difficulty performing the following activities:  Hearing? 0  Vision? 0  Difficulty concentrating or making decisions? 0  Walking or climbing stairs? 0  Dressing or bathing? 0  Doing errands, shopping? 0  Preparing Food and eating ? N  Using the Toilet? N  In the past six months, have you accidently leaked urine? N  Do you have problems with loss of bowel control? N  Managing your Medications? N  Managing your Finances? N  Housekeeping or managing your Housekeeping? N    Patient Care Team: [name], DO as PCP - General (Family Medicine) Warden Fillers, MD as Consulting Physician (Ophthalmology) Milus Banister, MD as Attending Physician (Gastroenterology) Thornell Sartorius, MD as Consulting Physician (Otolaryngology) Manson Passey, [place] (Specialist) Madelin Rear, MD (Inactive) as  Consulting Physician (Endocrinology)  Indicate any recent Medical Services you may have received from other than [facility] providers in the past year (date may be approximate).     Assessment:   This is a routine wellness examination for [place].  Hearing/Vision screen Hearing Screening - Comments:: Pt denies any hearing issues  Vision Screening - Comments:: Pt follows Dr Blair Heys for annul eye exams   Dietary issues and exercise activities discussed: Current Exercise Habits: Home exercise routine, Type of exercise: walking;Other - see comments (bowling), Time (Minutes): > 60, Frequency (Times/Week): 5, Weekly Exercise (Minutes/Week): 0   Goals Addressed             This Visit's Progress    Patient Stated       Maintain health        Depression Screen    09/12/2022   10:06 AM 07/31/2022    2:00 PM 08/30/2021   10:19 AM 02/10/2021    1:07 PM 08/09/2020    1:03 PM 08/04/2019    2:08 PM 02/05/2019    1:39 PM  PHQ 2/9 Scores  PHQ - 2 Score 0 0 0 0 1 0 0    Fall Risk    09/12/2022   10:08 AM 07/31/2022    2:01 PM 02/10/2021    1:07 PM 08/09/2020    1:03 PM 08/04/2019    2:08 PM  Fall Risk   Falls in the past year? 0 0 0 0 0  Number falls in past yr: 0 0 0 0 0  Injury with Fall? 0 0 0 0 0  Risk for fall due to : No Fall Risks;Impaired vision;Impaired balance/gait No Fall Risks     Risk for fall due to: Comment at times      Follow up Falls prevention discussed Falls evaluation completed  Falls evaluation completed Falls prevention discussed    FALL RISK PREVENTION PERTAINING TO THE  HOME:  Any stairs in or around the home? Yes  If so, are there any without handrails? No  Home free of loose throw rugs in walkways, pet beds, electrical cords, etc? Yes  Adequate lighting in your home to reduce risk of falls? Yes   ASSISTIVE DEVICES UTILIZED TO PREVENT FALLS:  Life alert? No  Use of a cane, walker or w/c? Yes  Grab bars in the bathroom? Yes  Shower chair or bench in shower?  No  Elevated toilet seat or a handicapped toilet? No   TIMED UP AND GO:  Was the test performed? No .   Cognitive Function:    08/04/2019    2:10 PM 07/01/2018   11:20 AM  MMSE - Mini Mental State Exam  Orientation to time 5 5  Orientation to Place 5 5  Registration 3 3  Attention/ Calculation 5 5  Recall 3 3  Language- name 2 objects 2 2  Language- repeat 1 1  Language- follow 3 step command 3 3  Language- read & follow direction 1 1  Write a sentence 1 1  Copy design 1 1  Total score 30 30        09/12/2022   10:10 AM  6CIT Screen  What Year? 0 points  What month? 0 points  What time? 0 points  Count back from 20 0 points  Months in reverse 0 points  Repeat phrase 0 points  Total Score 0 points    Immunizations Immunization History  Administered Date(s) Administered   PFIZER(Purple Top)SARS-COV-2 Vaccination 03/17/2020, 04/12/2020, 11/30/2020   Pfizer Covid-19 Vaccine Bivalent Booster 29yr & up 10/25/2021   Pneumococcal Conjugate-13 04/01/2014   Pneumococcal Polysaccharide-23 11/29/2015   Td 08/14/2011   Tdap 03/14/2022   Zoster Recombinat (Shingrix) 08/15/2022   Zoster, Live 01/20/2013    TDAP status: Up to date  Flu Vaccine status: Declined, Education has been provided regarding the importance of this vaccine but patient still declined. Advised may receive this vaccine at local pharmacy or Health Dept. Aware to provide a copy of the vaccination record if obtained from local pharmacy or Health Dept. Verbalized acceptance and understanding.  Pneumococcal vaccine status: Up to date  Covid-19 vaccine status: Completed vaccines  Qualifies for Shingles Vaccine? Yes   Zostavax completed Yes   Shingrix Completed?: Yes  Screening Tests Health Maintenance  Topic Date Due   COVID-19 Vaccine (5 - Pfizer risk series) 12/20/2021   MAMMOGRAM  09/05/2022   INFLUENZA VACCINE  03/24/2023 (Originally 07/24/2022)   Zoster Vaccines- Shingrix (2 of 2) 10/10/2022    DEXA SCAN  08/03/2023   COLONOSCOPY (Pts 45-488yrInsurance coverage will need to be confirmed)  05/29/2026   TETANUS/TDAP  03/14/2032   Pneumonia Vaccine 75Years old  Completed   Hepatitis C Screening  Completed   HPV VACCINES  Aged Out    Health Maintenance  Health Maintenance Due  Topic Date Due   COVID-19 Vaccine (5 - Pfizer risk series) 12/20/2021   MAMMOGRAM  09/05/2022    Colorectal cancer screening: Type of screening: Colonoscopy. Completed 05/29/16. Repeat every 10 years  Mammogram status: Completed 09/05/21. Repeat every year  Bone Density status: Completed 08/02/20. Results reflect: Bone density results: OSTEOPENIA. Repeat every 3 years.   Additional Screening:  Hepatitis C Screening: Completed 10/11/15  Vision Screening: Recommended annual ophthalmology exams for early detection of glaucoma and other disorders of the eye. Is the patient up to date with their annual eye exam?  Yes  Who is  the provider or what is the name of the office in which the patient attends annual eye exams? Dr Blair Heys  If pt is not established with a provider, would they like to be referred to a provider to establish care? No .   Dental Screening: Recommended annual dental exams for proper oral hygiene  Community Resource Referral / Chronic Care Management: CRR required this visit?  No   CCM required this visit?  No      Plan:     I have personally reviewed and noted the following in the patient's chart:   Medical and social history Use of alcohol, tobacco or illicit drugs  Current medications and supplements including opioid prescriptions. Patient is not currently taking opioid prescriptions. Functional ability and status Nutritional status Physical activity Advanced directives List of other physicians Hospitalizations, surgeries, and ER visits in previous 12 months Vitals Screenings to include cognitive, depression, and falls Referrals and appointments  In addition, I  have reviewed and discussed with patient certain preventive protocols, quality metrics, and best practice recommendations. A written personalized care plan for preventive services as well as general preventive health recommendations were provided to patient.     Willette Brace, LPN   [id number]   Nurse Notes: none

## 2022-09-19 ENCOUNTER — Ambulatory Visit (HOSPITAL_BASED_OUTPATIENT_CLINIC_OR_DEPARTMENT_OTHER)
Admission: RE | Admit: 2022-09-19 | Discharge: 2022-09-19 | Disposition: A | Discharge status: Home / Self Care | Payer: Medicare HMO | Source: Ambulatory Visit | Attending: Family Medicine | Admitting: Family Medicine

## 2022-09-19 ENCOUNTER — Encounter (HOSPITAL_BASED_OUTPATIENT_CLINIC_OR_DEPARTMENT_OTHER): Payer: Self-pay

## 2022-09-19 DIAGNOSIS — Z1231 Encounter for screening mammogram for malignant neoplasm of breast: Secondary | ICD-10-CM | POA: Diagnosis not present

## 2022-10-05 ENCOUNTER — Ambulatory Visit (INDEPENDENT_AMBULATORY_CARE_PROVIDER_SITE_OTHER): Payer: Medicare HMO | Admitting: Family Medicine

## 2022-10-05 ENCOUNTER — Encounter: Payer: Self-pay | Admitting: Family Medicine

## 2022-10-05 VITALS — BP 132/80 | HR 56 | Temp 97.7°F | Wt 163.8 lb

## 2022-10-05 DIAGNOSIS — K5792 Diverticulitis of intestine, part unspecified, without perforation or abscess without bleeding: Secondary | ICD-10-CM

## 2022-10-05 MED ORDER — AMOXICILLIN-POT CLAVULANATE 875-125 MG PO TABS: 1.0000 | ORAL_TABLET | Freq: Two times a day (BID) | ORAL | 0 refills | Status: DC

## 2022-10-05 NOTE — Patient Instructions (Signed)
Diverticulitis  Diverticulitis is when small pouches in your colon (large intestine) get infected or swollen. This causes pain in the belly (abdomen) and watery poop (diarrhea). These pouches are called diverticula. The pouches form in people who have a condition called diverticulosis. What are the causes? This condition may be caused by poop (stool) that gets trapped in the pouches in your colon. The poop lets germs (bacteria) grow in the pouches. This causes the infection. What increases the risk? You are more likely to get this condition if you have small pouches in your colon. The risk is higher if: You are overweight or very overweight (obese). You do not exercise enough. You drink alcohol. You smoke or use products with tobacco in them. You eat a diet that has a lot of red meat such as beef, pork, or lamb. You eat a diet that does not have enough fiber in it. You are older than 75 years of age. What are the signs or symptoms? Pain in the belly. Pain is often on the left side, but it may be in other areas. Fever and feeling cold. Feeling like you may vomit. Vomiting. Having cramps. Feeling full. Changes to how often you poop. Blood in your poop. How is this treated? Most cases are treated at home by: Taking over-the-counter pain medicines. Following a clear liquid diet. Taking antibiotic medicines. Resting. Very bad cases may need to be treated at a hospital. This may include: Not eating or drinking. Taking prescription pain medicine. Getting antibiotic medicines through an IV tube. Getting fluid and food through an IV tube. Having surgery. When you are feeling better, your doctor may tell you to have a test to check your colon (colonoscopy). Follow these instructions at home: Medicines Take over-the-counter and prescription medicines only as told by your doctor. These include: Antibiotics. Pain medicines. Fiber pills. Probiotics. Stool softeners. If you were  prescribed an antibiotic medicine, take it as told by your doctor. Do not stop taking the antibiotic even if you start to feel better. Ask your doctor if the medicine prescribed to you requires you to avoid driving or using machinery. Eating and drinking  Follow a diet as told by your doctor. When you feel better, your doctor may tell you to change your diet. You may need to eat a lot of fiber. Fiber makes it easier to poop (have a bowel movement). Foods with fiber include: Berries. Beans. Lentils. Green vegetables. Avoid eating red meat. General instructions Do not use any products that contain nicotine or tobacco, such as cigarettes, e-cigarettes, and chewing tobacco. If you need help quitting, ask your doctor. Exercise 3 or more times a week. Try to get 30 minutes each time. Exercise enough to sweat and make your heart beat faster. Keep all follow-up visits as told by your doctor. This is important. Contact a doctor if: Your pain does not get better. You are not pooping like normal. Get help right away if: Your pain gets worse. Your symptoms do not get better. Your symptoms get worse very fast. You have a fever. You vomit more than one time. You have poop that is: Bloody. Black. Tarry. Summary This condition happens when small pouches in your colon get infected or swollen. Take medicines only as told by your doctor. Follow a diet as told by your doctor. Keep all follow-up visits as told by your doctor. This is important. This information is not intended to replace advice given to you by your health care provider. Make sure   you discuss any questions you have with your health care provider. Document Revised: 09/21/2019 Document Reviewed: 09/21/2019 Elsevier Patient Education  2023 Elsevier Inc.  

## 2022-10-05 NOTE — Progress Notes (Unsigned)
Shelly Leonard , [birth year], 76 y.o., female MRN: [id number] Patient Care Team    Relationship Specialty Notifications Start End  [name], DO PCP - General Family Medicine  08/18/15   Warden Fillers, MD Consulting Physician Ophthalmology  05/08/16   Milus Banister, MD Attending Physician Gastroenterology  05/08/16   Thornell Sartorius, MD Consulting Physician Otolaryngology  06/25/17   Manson Passey, Emerge  Specialist  07/01/18   Madelin Rear, MD Consulting Physician Endocrinology  01/28/20     Chief Complaint  Patient presents with   Diverticulitis    Has left side back pain that triggered abdominal pain     Subjective: Pt presents for an OV with complaints of recurrent left lower quadrant discomfort consistent with prior diverticular flares.  Patient denies fevers or chills.  She denies any dysuria, urinary frequency or hematuria.  She denies any bowel habit changes or melena.  She is tolerating p.o.     09/12/2022   10:06 AM 07/31/2022    2:00 PM 08/30/2021   10:19 AM 02/10/2021    1:07 PM 08/09/2020    1:03 PM  Depression screen PHQ 2/9  Decreased Interest 0 0 0 0 0  Down, Depressed, Hopeless 0 0 0 0 1  PHQ - 2 Score 0 0 0 0 1    No Known Allergies Social History   Social History Narrative   Shelly Leonard is widowed (12/2016).    She enjoys music and is involved with her church.   Past Medical History:  Diagnosis Date   Allergy    Anemia    low iron (hgb 8)   Arthritis    knees and hands    Carpal tunnel syndrome    Cataract    Diverticulitis large intestine w/o perforation or abscess w/bleeding    GERD (gastroesophageal reflux disease)    Headache(784.0)    occasional    Hypertension    Lumbar spondylosis    Osteopenia    PONV (postoperative nausea and vomiting)    Vertigo    Vitamin D deficiency    Past Surgical History:  Procedure Laterality Date   BREAST REDUCTION SURGERY  1982   CARPAL TUNNEL RELEASE     left    COLONOSCOPY     FOOT  SURGERY     KNEE ARTHROSCOPY     right knee 2012    OTHER SURGICAL HISTORY     spur removed top of left foot    REDUCTION MAMMAPLASTY  1982   TOTAL KNEE ARTHROPLASTY  04/21/2012   Procedure: TOTAL KNEE ARTHROPLASTY;  Surgeon: Shelly Pole, MD;  Location: [facility];  Service: Orthopedics;  Laterality: Right;   VAGINAL HYSTERECTOMY  1977   Family History  Problem Relation Age of Onset   Breast cancer Mother        late in life   Diabetes Mother        late in life   Heart disease Father    Lung disease Father    Colon cancer Neg Hx    Esophageal cancer Neg Hx    Stomach cancer Neg Hx    Rectal cancer Neg Hx    Allergies as of 10/05/2022   No Known Allergies      Medication List        Accurate as of October 05, 2022 11:59 PM. If you have any questions, ask your nurse or doctor.          amoxicillin-clavulanate  875-125 MG tablet Commonly known as: AUGMENTIN Take 1 tablet by mouth 2 (two) times daily. Started by: Howard Pouch, DO   aspirin EC 81 MG tablet Take 81 mg by mouth daily.   fluticasone 50 MCG/ACT nasal spray Commonly known as: Flonase Place 1 spray into both nostrils 2 (two) times daily as needed (nasal congestion).   Garlic 616 MG Tabs Take 100 mg by mouth daily with breakfast.   meloxicam 15 MG tablet Commonly known as: MOBIC Take 1 tablet (15 mg total) by mouth daily.   OMEGA 3 PO Take 1 capsule by mouth daily.   pantoprazole 20 MG tablet Commonly known as: PROTONIX Take 1 tablet (20 mg total) by mouth daily as needed.   potassium chloride SA 20 MEQ tablet Commonly known as: KLOR-CON M Take 1 tablet (20 mEq total) by mouth daily. For more refills please call your Primary Care Doctor. No more refills from GI   PROBIOTIC DAILY PO Take by mouth.   tizanidine 2 MG capsule Commonly known as: Zanaflex Take 1 capsule (2 mg total) by mouth 2 (two) times daily as needed for muscle spasms.   triamterene-hydrochlorothiazide 37.5-25 MG  tablet Commonly known as: MAXZIDE-25 Take 1 tablet by mouth daily.   verapamil 120 MG tablet Commonly known as: CALAN Take 1 tablet (120 mg total) by mouth 2 (two) times daily.   VITAMIN D PO Take by mouth.   Xiidra 5 % Soln Generic drug: Lifitegrast Apply 1 drop to eye 2 (two) times daily.        All past medical history, surgical history, allergies, family history, immunizations andmedications were updated in the EMR today and reviewed under the history and medication portions of their EMR.     ROS Negative, with the exception of above mentioned in HPI   Objective:  BP 132/80   Pulse (!) 56   Temp 97.7 F (36.5 C)   Wt 163 lb 12.8 oz (74.3 kg)   SpO2 99%   BMI 30.95 kg/m  Body mass index is 30.95 kg/m. Physical Exam Vitals and nursing note reviewed.  Constitutional:      General: She is not in acute distress.    Appearance: Normal appearance. She is not ill-appearing, toxic-appearing or diaphoretic.  HENT:     Head: Normocephalic and atraumatic.     Mouth/Throat:     Mouth: Mucous membranes are moist.  Eyes:     General: No scleral icterus.       Right eye: No discharge.        Left eye: No discharge.     Extraocular Movements: Extraocular movements intact.     Conjunctiva/sclera: Conjunctivae normal.     Pupils: Pupils are equal, round, and reactive to light.  Cardiovascular:     Rate and Rhythm: Normal rate and regular rhythm.  Pulmonary:     Effort: Pulmonary effort is normal. No respiratory distress.     Breath sounds: Normal breath sounds. No wheezing, rhonchi or rales.  Abdominal:     General: Bowel sounds are normal. There is no distension.     Palpations: Abdomen is soft.     Tenderness: There is abdominal tenderness (LLQ). There is no guarding or rebound.  Musculoskeletal:     Cervical back: Neck supple. No tenderness.  Lymphadenopathy:     Cervical: No cervical adenopathy.  Skin:    General: Skin is warm and dry.     Coloration: Skin is  not jaundiced or pale.  Findings: No erythema or rash.  Neurological:     Mental Status: She is alert and oriented to person, place, and time. Mental status is at baseline.     Motor: No weakness.     Gait: Gait normal.  Psychiatric:        Mood and Affect: Mood normal.        Behavior: Behavior normal.        Thought Content: Thought content normal.        Judgment: Judgment normal.      No results found. No results found. No results found for this or any previous visit (from the past 24 hour(s)).  Assessment/Plan: Shelly Leonard is a 76 y.o. female present for OV for  Diverticulitis Possibly mild diverticular flare. - CBC w/Diff - C-reactive protein Agreed to prescribe Augmentin twice daily x10 days.  With the understanding if she has another flare, she will need to have a CT of her abdomen for evaluation.  Last CT she did not have completed.  Patient agrees with plan.    Reviewed expectations re: course of current medical issues. Discussed self-management of symptoms. Outlined signs and symptoms indicating need for more acute intervention. Patient verbalized understanding and all questions were answered. Patient received an After-Visit Summary.    Orders Placed This Encounter  Procedures   CBC w/Diff   C-reactive protein   Meds ordered this encounter  Medications   amoxicillin-clavulanate (AUGMENTIN) 875-125 MG tablet    Sig: Take 1 tablet by mouth 2 (two) times daily.    Dispense:  20 tablet    Refill:  0   Referral Orders  No referral(s) requested today     Note is dictated utilizing voice recognition software. Although note has been proof read prior to signing, occasional typographical errors still can be missed. If any questions arise, please do not hesitate to call for verification.   electronically signed by:  Howard Pouch, DO  [place]

## 2022-10-06 LAB — CBC WITH DIFFERENTIAL/PLATELET
Absolute Monocytes: 459 cells/uL (ref 200–950)
Basophils Absolute: 50 cells/uL (ref 0–200)
Basophils Relative: 0.8 %
Eosinophils Absolute: 322 cells/uL (ref 15–500)
Eosinophils Relative: 5.2 %
HCT: 38.9 % (ref 35.0–45.0)
Hemoglobin: 12.9 g/dL (ref 11.7–15.5)
Lymphs Abs: 2883 cells/uL (ref 850–3900)
MCH: 29.6 pg (ref 27.0–33.0)
MCHC: 33.2 g/dL (ref 32.0–36.0)
MCV: 89.2 fL (ref 80.0–100.0)
MPV: 10.1 fL (ref 7.5–12.5)
Monocytes Relative: 7.4 %
Neutro Abs: 2486 cells/uL (ref 1500–7800)
Neutrophils Relative %: 40.1 %
Platelets: 313 10*3/uL (ref 140–400)
RBC: 4.36 10*6/uL (ref 3.80–5.10)
RDW: 13.4 % (ref 11.0–15.0)
Total Lymphocyte: 46.5 %
WBC: 6.2 10*3/uL (ref 3.8–10.8)

## 2022-10-06 LAB — C-REACTIVE PROTEIN: CRP: 2.4 mg/L (ref <8.0)

## 2022-10-11 DIAGNOSIS — M545 Low back pain, unspecified: Secondary | ICD-10-CM | POA: Diagnosis not present

## 2022-10-22 ENCOUNTER — Telehealth: Payer: Self-pay

## 2022-10-22 NOTE — Telephone Encounter (Signed)
Spoke with patient regarding results/recommendations.  

## 2022-10-22 NOTE — Telephone Encounter (Signed)
Please schedule patient for an appointment. Although I assume the CT she is requesting is for her frequent diverticulitis, we need to reevaluate her and consider additional antibiotics if still having symptoms.   We also need to order advanced imaging studies during a office visit, in order for insurance to have the appropriate documentation required to submit for claim.

## 2022-10-22 NOTE — Telephone Encounter (Signed)
Patient seen last week.  She would like Dr. Raoul Pitch to enter order for her to get CT.  Patient can be reached at [phone]

## 2022-10-28 ENCOUNTER — Other Ambulatory Visit: Payer: Self-pay | Admitting: Family Medicine

## 2022-10-28 DIAGNOSIS — I10 Essential (primary) hypertension: Secondary | ICD-10-CM

## 2022-10-30 ENCOUNTER — Encounter: Payer: Self-pay | Admitting: Family Medicine

## 2022-10-30 ENCOUNTER — Ambulatory Visit (INDEPENDENT_AMBULATORY_CARE_PROVIDER_SITE_OTHER): Payer: Medicare HMO | Admitting: Family Medicine

## 2022-10-30 VITALS — BP 129/79 | HR 63 | Temp 98.1°F | Wt 165.4 lb

## 2022-10-30 DIAGNOSIS — K5732 Diverticulitis of large intestine without perforation or abscess without bleeding: Secondary | ICD-10-CM

## 2022-10-30 NOTE — Progress Notes (Signed)
Shelly Leonard , 09/28/1946, 76 y.o., female MRN: [id number] Patient Care Team    Relationship Specialty Notifications Start End  [name], DO PCP - General Family Medicine  08/18/15   Warden Fillers, MD Consulting Physician Ophthalmology  05/08/16   Milus Banister, MD Attending Physician Gastroenterology  05/08/16   Thornell Sartorius, MD Consulting Physician Otolaryngology  06/25/17   Manson Passey, Emerge  Specialist  07/01/18   Madelin Rear, MD Consulting Physician Endocrinology  01/28/20     Chief Complaint  Patient presents with   Diverticulitis     Subjective: Pt presents for an OV with follow-up on her diverticulitis.  Patient reports she is feeling much better.  She is changing her diet.  She noticed some of the herbal supplement she was taking was irritating her gut lining.  She completed the course of antibiotics.  She denies any fevers.  Bowel movements have returned to her normal without any melena. Prior note: complaints of recurrent left lower quadrant discomfort consistent with prior diverticular flares.  Patient denies fevers or chills.  She denies any dysuria, urinary frequency or hematuria.  She denies any bowel habit changes or melena.  She is tolerating p.o.     09/12/2022   10:06 AM 07/31/2022    2:00 PM 08/30/2021   10:19 AM 02/10/2021    1:07 PM 08/09/2020    1:03 PM  Depression screen PHQ 2/9  Decreased Interest 0 0 0 0 0  Down, Depressed, Hopeless 0 0 0 0 1  PHQ - 2 Score 0 0 0 0 1    No Known Allergies Social History   Social History Narrative   Shelly Leonard is widowed (12/2016).    She enjoys music and is involved with her church.   Past Medical History:  Diagnosis Date   Allergy    Anemia    low iron (hgb 8)   Arthritis    knees and hands    Carpal tunnel syndrome    Cataract    Diverticulitis large intestine w/o perforation or abscess w/bleeding    GERD (gastroesophageal reflux disease)    Headache(784.0)    occasional     Hypertension    Lumbar spondylosis    Osteopenia    PONV (postoperative nausea and vomiting)    Vertigo    Vitamin D deficiency    Past Surgical History:  Procedure Laterality Date   BREAST REDUCTION SURGERY  1982   CARPAL TUNNEL RELEASE     left    COLONOSCOPY     FOOT SURGERY     KNEE ARTHROSCOPY     right knee 2012    OTHER SURGICAL HISTORY     spur removed top of left foot    REDUCTION MAMMAPLASTY  1982   TOTAL KNEE ARTHROPLASTY  04/21/2012   Procedure: TOTAL KNEE ARTHROPLASTY;  Surgeon: [name], MD;  Location: [facility];  Service: Orthopedics;  Laterality: Right;   VAGINAL HYSTERECTOMY  1977   Family History  Problem Relation Age of Onset   Breast cancer Mother        late in life   Diabetes Mother        late in life   Heart disease Father    Lung disease Father    Colon cancer Neg Hx    Esophageal cancer Neg Hx    Stomach cancer Neg Hx    Rectal cancer Neg Hx    Allergies as of 10/30/2022  No Known Allergies      Medication List        Accurate as of October 30, 2022  1:24 PM. If you have any questions, ask your nurse or doctor.          STOP taking these medications    amoxicillin-clavulanate 875-125 MG tablet Commonly known as: AUGMENTIN Stopped by: Howard Pouch, DO       TAKE these medications    aspirin EC 81 MG tablet Take 81 mg by mouth daily.   fluticasone 50 MCG/ACT nasal spray Commonly known as: Flonase Place 1 spray into both nostrils 2 (two) times daily as needed (nasal congestion).   Garlic 742 MG Tabs Take 100 mg by mouth daily with breakfast.   meloxicam 15 MG tablet Commonly known as: MOBIC Take 1 tablet (15 mg total) by mouth daily.   OMEGA 3 PO Take 1 capsule by mouth daily.   pantoprazole 20 MG tablet Commonly known as: PROTONIX Take 1 tablet (20 mg total) by mouth daily as needed.   potassium chloride SA 20 MEQ tablet Commonly known as: KLOR-CON M Take 1 tablet (20 mEq total) by mouth daily. For more  refills please call your Primary Care Doctor. No more refills from GI   PROBIOTIC DAILY PO Take by mouth.   tizanidine 2 MG capsule Commonly known as: Zanaflex Take 1 capsule (2 mg total) by mouth 2 (two) times daily as needed for muscle spasms.   triamterene-hydrochlorothiazide 37.5-25 MG tablet Commonly known as: MAXZIDE-25 Take 1 tablet by mouth daily.   verapamil 120 MG tablet Commonly known as: CALAN Take 1 tablet (120 mg total) by mouth 2 (two) times daily.   VITAMIN D PO Take by mouth.   Xiidra 5 % Soln Generic drug: Lifitegrast Apply 1 drop to eye 2 (two) times daily.        All past medical history, surgical history, allergies, family history, immunizations andmedications were updated in the EMR today and reviewed under the history and medication portions of their EMR.     ROS Negative, with the exception of above mentioned in HPI   Objective:  BP 129/79   Pulse 63   Temp 98.1 F (36.7 C)   Wt 165 lb 6.4 oz (75 kg)   SpO2 97%   BMI 31.25 kg/m  Body mass index is 31.25 kg/m. Physical Exam Vitals and nursing note reviewed.  Constitutional:      General: She is not in acute distress.    Appearance: Normal appearance. She is normal weight. She is not ill-appearing or toxic-appearing.  Eyes:     Extraocular Movements: Extraocular movements intact.     Conjunctiva/sclera: Conjunctivae normal.     Pupils: Pupils are equal, round, and reactive to light.  Abdominal:     General: There is no distension.     Palpations: Abdomen is soft.     Tenderness: There is no abdominal tenderness.  Neurological:     Mental Status: She is alert and oriented to person, place, and time. Mental status is at baseline.  Psychiatric:        Mood and Affect: Mood normal.        Behavior: Behavior normal.        Thought Content: Thought content normal.        Judgment: Judgment normal.      No results found. No results found. No results found for this or any previous  visit (from the past 24 hour(s)).  Assessment/Plan: Shelly Leonard is a 76 y.o. female present for OV for  Diverticulitis Symptoms have improved with antibiotics and dietary changes. -CBC and CRP were normal. Discussed CT order, and since symptoms have resolved would not need to move forward with this at this time unless she felt otherwise.  Patient does not want to have CT completed. Follow-up as needed   Reviewed expectations re: course of current medical issues. Discussed self-management of symptoms. Outlined signs and symptoms indicating need for more acute intervention. Patient verbalized understanding and all questions were answered. Patient received an After-Visit Summary.    No orders of the defined types were placed in this encounter.  No orders of the defined types were placed in this encounter.  Referral Orders  No referral(s) requested today     Note is dictated utilizing voice recognition software. Although note has been proof read prior to signing, occasional typographical errors still can be missed. If any questions arise, please do not hesitate to call for verification.   electronically signed by:  Howard Pouch, DO  [place]

## 2022-10-30 NOTE — Patient Instructions (Addendum)
Potassium rich foods:Bananas, oranges, cantaloupe, grapefruit ,prunes, raisins, and dates,Cooked spinach., Cooked broccoli.,Potatoes.,Sweet potatoes,Mushrooms, Peas,Cucumbers   Diverticulitis  Diverticulitis is when small pouches in your colon (large intestine) get infected or swollen. This causes pain in the belly (abdomen) and watery poop (diarrhea). These pouches are called diverticula. The pouches form in people who have a condition called diverticulosis. What are the causes? This condition may be caused by poop (stool) that gets trapped in the pouches in your colon. The poop lets germs (bacteria) grow in the pouches. This causes the infection. What increases the risk? You are more likely to get this condition if you have small pouches in your colon. The risk is higher if: You are overweight or very overweight (obese). You do not exercise enough. You drink alcohol. You smoke or use products with tobacco in them. You eat a diet that has a lot of red meat such as beef, pork, or lamb. You eat a diet that does not have enough fiber in it. You are older than 75 years of age. What are the signs or symptoms? Pain in the belly. Pain is often on the left side, but it may be in other areas. Fever and feeling cold. Feeling like you may vomit. Vomiting. Having cramps. Feeling full. Changes to how often you poop. Blood in your poop. How is this treated? Most cases are treated at home by: Taking over-the-counter pain medicines. Following a clear liquid diet. Taking antibiotic medicines. Resting. Very bad cases may need to be treated at a hospital. This may include: Not eating or drinking. Taking prescription pain medicine. Getting antibiotic medicines through an IV tube. Getting fluid and food through an IV tube. Having surgery. When you are feeling better, your doctor may tell you to have a test to check your colon (colonoscopy). Follow these instructions at  home: Medicines Take over-the-counter and prescription medicines only as told by your doctor. These include: Antibiotics. Pain medicines. Fiber pills. Probiotics. Stool softeners. If you were prescribed an antibiotic medicine, take it as told by your doctor. Do not stop taking the antibiotic even if you start to feel better. Ask your doctor if the medicine prescribed to you requires you to avoid driving or using machinery. Eating and drinking  Follow a diet as told by your doctor. When you feel better, your doctor may tell you to change your diet. You may need to eat a lot of fiber. Fiber makes it easier to poop (have a bowel movement). Foods with fiber include: Berries. Beans. Lentils. Green vegetables. Avoid eating red meat. General instructions Do not use any products that contain nicotine or tobacco, such as cigarettes, e-cigarettes, and chewing tobacco. If you need help quitting, ask your doctor. Exercise 3 or more times a week. Try to get 30 minutes each time. Exercise enough to sweat and make your heart beat faster. Keep all follow-up visits as told by your doctor. This is important. Contact a doctor if: Your pain does not get better. You are not pooping like normal. Get help right away if: Your pain gets worse. Your symptoms do not get better. Your symptoms get worse very fast. You have a fever. You vomit more than one time. You have poop that is: Bloody. Black. Tarry. Summary This condition happens when small pouches in your colon get infected or swollen. Take medicines only as told by your doctor. Follow a diet as told by your doctor. Keep all follow-up visits as told by your doctor. This  is important. This information is not intended to replace advice given to you by your health care provider. Make sure you discuss any questions you have with your health care provider. Document Revised: 09/21/2019 Document Reviewed: 09/21/2019 Elsevier Patient Education  [place].

## 2022-11-19 ENCOUNTER — Telehealth: Payer: Self-pay | Admitting: Family Medicine

## 2022-11-19 NOTE — Telephone Encounter (Signed)
Pt called and wanted to speak with a nurse. I asked her what was going on and what was the concern she proceeded to say it has to do with two medications and how they are causing her to be drowsy. She could not name which medications and asked that a nurse return her call. She declined scheduling an appt to discuss her concerns with Dr. Raoul Pitch because she gets tired easily. Please give the patient a call to discuss.

## 2022-11-19 NOTE — Telephone Encounter (Signed)
LM for pt to return call to discuss.  

## 2022-11-20 ENCOUNTER — Ambulatory Visit: Payer: Medicare HMO | Admitting: Allergy

## 2022-11-20 ENCOUNTER — Encounter: Payer: Self-pay | Admitting: Allergy

## 2022-11-20 VITALS — BP 138/88 | HR 66 | Temp 98.2°F | Resp 20 | Ht 62.0 in | Wt 166.0 lb

## 2022-11-20 DIAGNOSIS — J0100 Acute maxillary sinusitis, unspecified: Secondary | ICD-10-CM | POA: Diagnosis not present

## 2022-11-20 DIAGNOSIS — J3089 Other allergic rhinitis: Secondary | ICD-10-CM | POA: Diagnosis not present

## 2022-11-20 DIAGNOSIS — J302 Other seasonal allergic rhinitis: Secondary | ICD-10-CM

## 2022-11-20 MED ORDER — PREDNISONE 10 MG PO TABS: ORAL_TABLET | ORAL | 0 refills | Status: DC

## 2022-11-20 MED ORDER — AMOXICILLIN-POT CLAVULANATE 875-125 MG PO TABS: 1.0000 | ORAL_TABLET | Freq: Two times a day (BID) | ORAL | 0 refills | Status: DC

## 2022-11-20 NOTE — Progress Notes (Signed)
Follow Up Note  RE: Shelly Leonard MRN: [id number] DOB: [birth year] Date of Office Visit: 11/20/2022  Referring provider: [name], DO Primary care provider: [name], DO  Chief Complaint: Allergic Rhinitis  (Nose is stopped up causing her to be dizzy. )  History of Present Illness: I had the pleasure of seeing Shelly Leonard for a follow up visit at the [facility] [place] on 11/20/2022. She is a 76 y.o. female, who is being followed for allergic rhinitis, anosmia . Her previous allergy office visit was on 08/30/2022 with Dr. Maudie Leonard. Today is a new complaint visit of sinus issues .  Seasonal and perennial allergic rhinitis She took prednisone at the last visit which helped.   Last week she started to noticed nasal congestion, headaches, and dizziness again.  No fevers/chills. No sick contacts.  Currently on Flonase 2 sprays per nostril once a day, allegra daily. No nosebleeds.  Assessment and Plan: Shelly Leonard is a 76 y.o. female with: Acute non-recurrent maxillary sinusitis Severe nasal congestion with headaches and dizziness. Denies fevers/chills.  Start prednisone taper. Prednisone '10mg'$  tablets - take 2 tablets for 4 days then 1 tablet on day 5.  Start Augmentin '875mg'$  1 tablet twice a day for 10 days. Use Flonase (fluticasone) nasal spray 1 spray per nostril twice a day as needed for nasal congestion.  Nasal saline spray (i.e., Simply Saline) or nasal saline lavage (i.e., NeilMed) is recommended as needed and prior to medicated nasal sprays. If you are really congested - may use sudafed or afrin for a few days at a time only.   Seasonal and perennial allergic rhinitis Past history - 2022 skin testing was positive to dust mites and tree pollen. Xhance not covered. Interim history - increased symptoms. Heat is on at home.  Continue environmental control measures as below. Use Flonase (fluticasone) nasal spray 1 spray per nostril twice a day as needed for  nasal congestion.  Nasal saline spray (i.e., Simply Saline) or nasal saline lavage (i.e., NeilMed) is recommended as needed and prior to medicated nasal sprays. Use over the counter antihistamines such as Zyrtec (cetirizine), Claritin (loratadine), Allegra (fexofenadine), or Xyzal (levocetirizine) daily as needed. May switch antihistamines every few months. Consider allergy injections for long term control if above medications do not help the symptoms.   Return in about 6 months (around 05/21/2023).  Meds ordered this encounter  Medications   amoxicillin-clavulanate (AUGMENTIN) 875-125 MG tablet    Sig: Take 1 tablet by mouth 2 (two) times daily.    Dispense:  20 tablet    Refill:  0   predniSONE (DELTASONE) 10 MG tablet    Sig: Start prednisone taper. Prednisone '10mg'$  tablets - take 2 tablets for 4 days then 1 tablet on day 5.    Dispense:  9 tablet    Refill:  0   Lab Orders  No laboratory test(s) ordered today    Diagnostics: None.   Medication List:  Current Outpatient Medications  Medication Sig Dispense Refill   amoxicillin-clavulanate (AUGMENTIN) 875-125 MG tablet Take 1 tablet by mouth 2 (two) times daily. 20 tablet 0   aspirin EC 81 MG tablet Take 81 mg by mouth daily.     Cholecalciferol (VITAMIN D PO) Take by mouth.     fluticasone (FLONASE) 50 MCG/ACT nasal spray Place 1 spray into both nostrils 2 (two) times daily as needed (nasal congestion). 16 g 5   gabapentin (NEURONTIN) 300 MG capsule Take 300 mg by  mouth at bedtime.     meloxicam (MOBIC) 15 MG tablet Take 1 tablet (15 mg total) by mouth daily. 90 tablet 1   Omega-3 Fatty Acids (OMEGA 3 PO) Take 1 capsule by mouth daily.     pantoprazole (PROTONIX) 20 MG tablet Take 1 tablet (20 mg total) by mouth daily as needed. 90 tablet 1   potassium chloride SA (KLOR-CON M) 20 MEQ tablet Take 1 tablet (20 mEq total) by mouth daily. For more refills please call your Primary Care Doctor. No more refills from GI 90 tablet 1    predniSONE (DELTASONE) 10 MG tablet Start prednisone taper. Prednisone '10mg'$  tablets - take 2 tablets for 4 days then 1 tablet on day 5. 9 tablet 0   Probiotic Product (PROBIOTIC DAILY PO) Take by mouth.     tizanidine (ZANAFLEX) 2 MG capsule Take 1 capsule (2 mg total) by mouth 2 (two) times daily as needed for muscle spasms. 180 capsule 1   traMADol (ULTRAM) 50 MG tablet Take 50 mg by mouth every 6 (six) hours as needed.     triamterene-hydrochlorothiazide (MAXZIDE-25) 37.5-25 MG tablet Take 1 tablet by mouth daily. 90 tablet 1   verapamil (CALAN) 120 MG tablet Take 1 tablet (120 mg total) by mouth 2 (two) times daily. 180 tablet 1   XIIDRA 5 % SOLN Apply 1 drop to eye 2 (two) times daily.     No current facility-administered medications for this visit.   Allergies: No Known Allergies I reviewed her past medical history, social history, family history, and environmental history and no significant changes have been reported from her previous visit.  Review of Systems  Constitutional:  Negative for appetite change, chills, fever and unexpected weight change.  HENT:  Positive for congestion, rhinorrhea and sinus pressure.   Eyes:  Negative for itching.  Respiratory:  Negative for cough, chest tightness, shortness of breath and wheezing.   Gastrointestinal:  Negative for abdominal pain.  Skin:  Negative for rash.  Allergic/Immunologic: Positive for environmental allergies.  Neurological:  Positive for headaches.    Objective: BP 138/88   Pulse 66   Temp 98.2 F (36.8 C)   Resp 20   Ht '5\' 2"'$  (1.575 m)   Wt 166 lb (75.3 kg)   SpO2 98%   BMI 30.36 kg/m  Body mass index is 30.36 kg/m. Physical Exam Vitals and nursing note reviewed.  Constitutional:      Appearance: Normal appearance. She is well-developed.  HENT:     Head: Normocephalic and atraumatic.     Right Ear: Tympanic membrane and external ear normal.     Left Ear: Tympanic membrane and external ear normal.     Nose:  Nose normal.     Mouth/Throat:     Mouth: Mucous membranes are moist.     Pharynx: Oropharynx is clear.  Eyes:     Conjunctiva/sclera: Conjunctivae normal.  Cardiovascular:     Rate and Rhythm: Normal rate and regular rhythm.     Heart sounds: Normal heart sounds. No murmur heard. Pulmonary:     Effort: Pulmonary effort is normal.     Breath sounds: Normal breath sounds. No wheezing, rhonchi or rales.  Musculoskeletal:     Cervical back: Neck supple.  Skin:    General: Skin is warm.     Findings: No rash.  Neurological:     Mental Status: She is alert and oriented to person, place, and time.  Psychiatric:  Behavior: Behavior normal.    Previous notes and tests were reviewed. The plan was reviewed with the patient/family, and all questions/concerned were addressed.  It was my pleasure to see Ronie today and participate in her care. Please feel free to contact me with any questions or concerns.  Sincerely,  Rexene Alberts, DO Allergy & Immunology  [facility] office: [place] office: [phone]

## 2022-11-20 NOTE — Patient Instructions (Addendum)
Environmental allergies 2022 skin testing was positive to dust mites and tree pollen. Start prednisone taper. Prednisone '10mg'$  tablets - take 2 tablets for 4 days then 1 tablet on day 5.  Start Augmentin '875mg'$  1 tablet twice a day for 10 days.  Continue environmental control measures as below. Use Flonase (fluticasone) nasal spray 1 spray per nostril twice a day as needed for nasal congestion.  Nasal saline spray (i.e., Simply Saline) or nasal saline lavage (i.e., NeilMed) is recommended as needed and prior to medicated nasal sprays.  Use over the counter antihistamines such as Zyrtec (cetirizine), Claritin (loratadine), Allegra (fexofenadine), or Xyzal (levocetirizine) daily as needed. May switch antihistamines every few months. If you are really congested - you may use sudafed or afrin for a few days at a time only.   Follow up in 6 months or sooner if needed.    Buffered Isotonic Saline Irrigations:  Goal: When you irrigate with the isotonic saline (salt water) it washes mucous and other debris from your nose that could be contributing to your nasal symptoms.   Recipe: Obtain 1 quart jar that is clean Fill with clean (bottled, boiled or distilled) water Add 1-2 heaping teaspoons of salt without iodine If the solution with 2 teaspoons of salt is too strong, adjust the amount down until better tolerated Add 1 teaspoon of [employer] baking soda (pure bicarbonate) Mix ingredients together and store at room temperature and discard after 1 week * Alternatively you can buy pre made salt packets for the NeilMed bottle or there          are other over the counter brands available  Instructions: Warm  cup of the solution in the microwave if desired but be careful not to overheat as this will burn the inside of your nose Stand over a sink (or do it while you shower) and squirt the solution into one side of your nose aiming towards the back of your head Sometimes saying "coca cola" while  irrigating can be helpful to prevent fluid from going down your throat  The solution will travel to the back of your nose and then come out the other side Perform this again on the other side Try to do this twice a day If you are using a nasal spray in addition to the irrigation, irrigate first and then use the topical nasal spray otherwise you will wash the nasal spray out of your nose   Control of House Dust Mite Allergen Dust mite allergens are a common trigger of allergy and asthma symptoms. While they can be found throughout the house, these microscopic creatures thrive in warm, humid environments such as bedding, upholstered furniture and carpeting. Because so much time is spent in the bedroom, it is essential to reduce mite levels there.  Encase pillows, mattresses, and box springs in special allergen-proof fabric covers or airtight, zippered plastic covers.  Bedding should be washed weekly in hot water (130 F) and dried in a hot dryer. Allergen-proof covers are available for comforters and pillows that can't be regularly washed.  Wash the allergy-proof covers every few months. Minimize clutter in the bedroom. Keep pets out of the bedroom.  Keep humidity less than 50% by using a dehumidifier or air conditioning. You can buy a humidity measuring device called a hygrometer to monitor this.  If possible, replace carpets with hardwood, linoleum, or washable area rugs. If that's not possible, vacuum frequently with a vacuum that has a HEPA filter. Remove all upholstered  furniture and non-washable window drapes from the bedroom. Remove all non-washable stuffed toys from the bedroom.  Wash stuffed toys weekly. Reducing Pollen Exposure Pollen seasons: trees (spring), grass (summer) and ragweed/weeds (fall). Keep windows closed in your home and car to lower pollen exposure.  Install air conditioning in the bedroom and throughout the house if possible.  Avoid going out in dry windy days -  especially early morning. Pollen counts are highest between 5 - 10 AM and on dry, hot and windy days.  Save outside activities for late afternoon or after a heavy rain, when pollen levels are lower.  Avoid mowing of grass if you have grass pollen allergy. Be aware that pollen can also be transported indoors on people and pets.  Dry your clothes in an automatic dryer rather than hanging them outside where they might collect pollen.  Rinse hair and eyes before bedtime.

## 2022-11-20 NOTE — Assessment & Plan Note (Signed)
Severe nasal congestion with headaches and dizziness. Denies fevers/chills.  Start prednisone taper. Prednisone '10mg'$  tablets - take 2 tablets for 4 days then 1 tablet on day 5.  Start Augmentin '875mg'$  1 tablet twice a day for 10 days. Use Flonase (fluticasone) nasal spray 1 spray per nostril twice a day as needed for nasal congestion.  Nasal saline spray (i.e., Simply Saline) or nasal saline lavage (i.e., NeilMed) is recommended as needed and prior to medicated nasal sprays. If you are really congested - may use sudafed or afrin for a few days at a time only.

## 2022-11-20 NOTE — Assessment & Plan Note (Signed)
Past history - 2022 skin testing was positive to dust mites and tree pollen. Xhance not covered. Interim history - increased symptoms. Heat is on at home.  Continue environmental control measures as below. Use Flonase (fluticasone) nasal spray 1 spray per nostril twice a day as needed for nasal congestion.  Nasal saline spray (i.e., Simply Saline) or nasal saline lavage (i.e., NeilMed) is recommended as needed and prior to medicated nasal sprays. Use over the counter antihistamines such as Zyrtec (cetirizine), Claritin (loratadine), Allegra (fexofenadine), or Xyzal (levocetirizine) daily as needed. May switch antihistamines every few months. Consider allergy injections for long term control if above medications do not help the symptoms.

## 2022-11-20 NOTE — Telephone Encounter (Signed)
Patient returning Vanessa's call from yesterday afternoon.  Patient stated she had to come see allergy and asthma today and she will check to see if CMA is available when she comes in

## 2022-11-21 NOTE — Telephone Encounter (Signed)
Pt spoke with Adolm Joseph, CMA

## 2022-11-23 ENCOUNTER — Ambulatory Visit: Payer: Medicare HMO | Admitting: Family Medicine

## 2022-12-25 ENCOUNTER — Other Ambulatory Visit: Payer: Self-pay | Admitting: Family

## 2023-02-20 ENCOUNTER — Other Ambulatory Visit: Payer: Self-pay | Admitting: Family Medicine

## 2023-03-04 ENCOUNTER — Telehealth: Payer: Self-pay

## 2023-03-04 DIAGNOSIS — I10 Essential (primary) hypertension: Secondary | ICD-10-CM

## 2023-03-04 MED ORDER — VERAPAMIL HCL 120 MG PO TABS: 120.0000 mg | ORAL_TABLET | Freq: Two times a day (BID) | ORAL | 0 refills | Status: DC

## 2023-03-04 NOTE — Telephone Encounter (Signed)
Patient refill request.  [place]  Patient has appt on 4/5 with Dr. Raoul Pitch.  verapamil (CALAN) 120 MG tablet

## 2023-03-04 NOTE — Telephone Encounter (Signed)
noted 

## 2023-03-14 DIAGNOSIS — M25512 Pain in left shoulder: Secondary | ICD-10-CM | POA: Diagnosis not present

## 2023-03-29 ENCOUNTER — Ambulatory Visit (INDEPENDENT_AMBULATORY_CARE_PROVIDER_SITE_OTHER): Payer: Medicare HMO | Admitting: Family Medicine

## 2023-03-29 ENCOUNTER — Encounter: Payer: Self-pay | Admitting: Family Medicine

## 2023-03-29 VITALS — BP 158/84 | HR 83 | Temp 97.8°F | Wt 157.6 lb

## 2023-03-29 DIAGNOSIS — Z79899 Other long term (current) drug therapy: Secondary | ICD-10-CM | POA: Diagnosis not present

## 2023-03-29 DIAGNOSIS — R7309 Other abnormal glucose: Secondary | ICD-10-CM

## 2023-03-29 DIAGNOSIS — K219 Gastro-esophageal reflux disease without esophagitis: Secondary | ICD-10-CM

## 2023-03-29 DIAGNOSIS — Z5181 Encounter for therapeutic drug level monitoring: Secondary | ICD-10-CM

## 2023-03-29 DIAGNOSIS — I10 Essential (primary) hypertension: Secondary | ICD-10-CM

## 2023-03-29 DIAGNOSIS — E559 Vitamin D deficiency, unspecified: Secondary | ICD-10-CM

## 2023-03-29 MED ORDER — MELOXICAM 15 MG PO TABS: 15.0000 mg | ORAL_TABLET | Freq: Every day | ORAL | 1 refills | Status: DC

## 2023-03-29 MED ORDER — POTASSIUM CHLORIDE CRYS ER 20 MEQ PO TBCR: 20.0000 meq | EXTENDED_RELEASE_TABLET | Freq: Every day | ORAL | 1 refills | Status: DC

## 2023-03-29 MED ORDER — PANTOPRAZOLE SODIUM 20 MG PO TBEC: 20.0000 mg | DELAYED_RELEASE_TABLET | Freq: Every day | ORAL | 1 refills | Status: DC | PRN

## 2023-03-29 MED ORDER — TIZANIDINE HCL 2 MG PO CAPS: 2.0000 mg | ORAL_CAPSULE | Freq: Two times a day (BID) | ORAL | 1 refills | Status: DC | PRN

## 2023-03-29 MED ORDER — VERAPAMIL HCL ER 180 MG PO TBCR
180.0000 mg | EXTENDED_RELEASE_TABLET | Freq: Every day | ORAL | 1 refills | Status: DC
Start: 2023-03-29 — End: 2023-04-23

## 2023-03-29 MED ORDER — TRIAMTERENE-HCTZ 37.5-25 MG PO TABS: 1.0000 | ORAL_TABLET | Freq: Every day | ORAL | 1 refills | Status: DC

## 2023-03-29 NOTE — Patient Instructions (Signed)
No follow-ups on file.        Great to see you today.  I have refilled the medication(s) we provide.   If labs were collected, we will inform you of lab results once received either by echart message or telephone call.   - echart message- for normal results that have been seen by the patient already.   - telephone call: abnormal results or if patient has not viewed results in their echart.  

## 2023-03-29 NOTE — Progress Notes (Addendum)
Patient Care Team    Relationship Specialty Notifications Start End  Shelly Leatherwood, DO PCP - General Family Medicine  08/18/15   Shelly Lat, MD Consulting Physician Ophthalmology  05/08/16   Shelly Fee, MD Attending Physician Gastroenterology  05/08/16   Shelly Barre, MD Consulting Physician Otolaryngology  06/25/17   Shelly Leonard, [facility]  Specialist  07/01/18   Shelly Lard, MD Consulting Physician Endocrinology  01/28/20     Chief Complaint  Patient presents with   Hypertension    History of Present Ilness: Shelly Leonard, 77 y.o. , female presents today for [facility]  Essential hypertension, benign/morbid obesity Pt reports compliance with Maxzide 75-50 and verapamil 120 mg BID. Blood pressures ranges at home not routinely checked.  Patient denies chest pain, shortness of breath, dizziness or lower extremity edema.   Pt takes a daily baby ASA. Pt is not prescribed statin. Labs due Diet: Low sodium. Exercise: Routine exercise. RF: Hypertension, obesity, family history of heart disease   Gastroesophageal reflux disease, esophagitis presence not specified Patient reports appliance with Protonix 20 mg daily.   Medicine is working works well for her   Hypercalcemia/Thyroid nodule She does have multiple nodules within the thyroid.  Her biopsy was negative for any type of malignancy.  SHe has started to increase her water consumption daily. She is following with endocrinology.      Past Medical History:  Diagnosis Date   Allergy    Anemia    low iron (hgb 8)   Arthritis    knees and hands    Carpal tunnel syndrome    Cataract    Diverticulitis large intestine w/o perforation or abscess w/bleeding    GERD (gastroesophageal reflux disease)    Headache(784.0)    occasional    Hypertension    Lumbar spondylosis    Osteopenia    PONV (postoperative nausea and vomiting)    Vertigo    Vitamin D deficiency    All allergies reviewed No Known Allergies Past  Surgical History:  Procedure Laterality Date   BREAST REDUCTION SURGERY  1982   CARPAL TUNNEL RELEASE     left    COLONOSCOPY     FOOT SURGERY     KNEE ARTHROSCOPY     right knee 2012    OTHER SURGICAL HISTORY     spur removed top of left foot    REDUCTION MAMMAPLASTY  1982   TOTAL KNEE ARTHROPLASTY  04/21/2012   Procedure: TOTAL KNEE ARTHROPLASTY;  Surgeon: [name], MD;  Location: [facility];  Service: Orthopedics;  Laterality: Right;   VAGINAL HYSTERECTOMY  1977   Family History  Problem Relation Age of Onset   Breast cancer Mother        late in life   Diabetes Mother        late in life   Heart disease Father    Lung disease Father    Colon cancer Neg Hx    Esophageal cancer Neg Hx    Stomach cancer Neg Hx    Rectal cancer Neg Hx    Social History   Social History Narrative   [name] is widowed (12/2016).    She enjoys music and is involved with her church.    All medications verified Allergies as of 03/29/2023   No Known Allergies      Medication List        Accurate as of March 29, 2023  1:46 PM. If you have  any questions, ask your nurse or doctor.          STOP taking these medications    amoxicillin-clavulanate 875-125 MG tablet Commonly known as: AUGMENTIN Stopped by: Shelly Pacinienee Azlan Hanway, DO   predniSONE 10 MG tablet Commonly known as: DELTASONE Stopped by: Shelly Pacinienee Nariah Morgano, DO   Prevnar 20 0.5 ML injection Generic drug: pneumococcal 20-valent conjugate vaccine Stopped by: Shelly Pacinienee Revanth Neidig, DO   Shingrix injection Generic drug: Zoster Vaccine Adjuvanted Stopped by: Shelly Pacinienee Roscoe Witts, DO   verapamil 120 MG tablet Commonly known as: CALAN Replaced by: verapamil 180 MG CR tablet Stopped by: Shelly Pacinienee Jazper Nikolai, DO       TAKE these medications    aspirin EC 81 MG tablet Take 81 mg by mouth daily.   fluticasone 50 MCG/ACT nasal spray Commonly known as: FLONASE USE 1 SPRAY(S) IN EACH NOSTRIL TWICE DAILY AS NEEDED FOR  NASAL  CONGESTION   gabapentin 300  MG capsule Commonly known as: NEURONTIN Take 300 mg by mouth at bedtime.   meloxicam 15 MG tablet Commonly known as: MOBIC Take 1 tablet (15 mg total) by mouth daily.   OMEGA 3 PO Take 1 capsule by mouth daily.   pantoprazole 20 MG tablet Commonly known as: PROTONIX Take 1 tablet (20 mg total) by mouth daily as needed.   potassium chloride SA 20 MEQ tablet Commonly known as: KLOR-CON M Take 1 tablet (20 mEq total) by mouth daily. For more refills please call your Primary Care Doctor. No more refills from GI   PROBIOTIC DAILY PO Take by mouth.   tizanidine 2 MG capsule Commonly known as: Zanaflex Take 1 capsule (2 mg total) by mouth 2 (two) times daily as needed for muscle spasms.   traMADol 50 MG tablet Commonly known as: ULTRAM Take 50 mg by mouth every 6 (six) hours as needed.   triamterene-hydrochlorothiazide 37.5-25 MG tablet Commonly known as: MAXZIDE-25 Take 1 tablet by mouth daily.   verapamil 180 MG CR tablet Commonly known as: CALAN-SR Take 1 tablet (180 mg total) by mouth at bedtime. Replaces: verapamil 120 MG tablet Started by: Shelly Pacinienee Richanda Darin, DO   VITAMIN D PO Take by mouth.   Xiidra 5 % Soln Generic drug: Lifitegrast Apply 1 drop to eye 2 (two) times daily.           09/12/2022   10:06 AM 07/31/2022    2:00 PM 08/30/2021   10:19 AM 02/10/2021    1:07 PM 08/09/2020    1:03 PM  Depression screen PHQ 2/9  Decreased Interest 0 0 0 0 0  Down, Depressed, Hopeless 0 0 0 0 1  PHQ - 2 Score 0 0 0 0 1       No data to display           Immunizations: Immunization History  Administered Date(s) Administered   PFIZER(Purple Top)SARS-COV-2 Vaccination 03/17/2020, 04/12/2020, 11/30/2020   PNEUMOCOCCAL CONJUGATE-20 12/26/2022   Pfizer Covid-19 Vaccine Bivalent Booster 1024yrs & up 10/25/2021   Pneumococcal Conjugate-13 04/01/2014   Pneumococcal Polysaccharide-23 11/29/2015   Td 08/14/2011   Tdap 03/14/2022   Zoster Recombinat (Shingrix) 08/15/2022,  11/14/2022   Zoster, Live 01/20/2013   Objective:  BP (!) 158/84   Pulse 83   Temp 97.8 F (36.6 C)   Wt 157 lb 9.6 oz (71.5 kg)   SpO2 97%   BMI 28.83 kg/m  Physical Exam Vitals and nursing note reviewed.  Constitutional:      General: She is not in acute distress.  Appearance: Normal appearance. She is obese. She is not ill-appearing, toxic-appearing or diaphoretic.  HENT:     Head: Normocephalic and atraumatic.     Mouth/Throat:     Mouth: Mucous membranes are moist.  Eyes:     General: No scleral icterus.       Right eye: No discharge.        Left eye: No discharge.     Extraocular Movements: Extraocular movements intact.     Conjunctiva/sclera: Conjunctivae normal.     Pupils: Pupils are equal, round, and reactive to light.  Cardiovascular:     Rate and Rhythm: Normal rate and regular rhythm.  Pulmonary:     Effort: Pulmonary effort is normal. No respiratory distress.     Breath sounds: Normal breath sounds. No wheezing, rhonchi or rales.  Musculoskeletal:     Cervical back: Neck supple. No tenderness.     Right lower leg: No edema.     Left lower leg: No edema.  Lymphadenopathy:     Cervical: No cervical adenopathy.  Skin:    General: Skin is warm and dry.     Coloration: Skin is not jaundiced or pale.     Findings: No erythema or rash.  Neurological:     Mental Status: She is alert and oriented to person, place, and time. Mental status is at baseline.     Motor: No weakness.     Gait: Gait normal.  Psychiatric:        Mood and Affect: Mood normal.        Behavior: Behavior normal.        Thought Content: Thought content normal.        Judgment: Judgment normal.     Assessment and Plan Shelly HeroBarbara A Purkey is a 77 y.o. [facility]  Essential hypertension, benign Above goal today Continue Maxide at lower dose 37.5-25 Increase  verapamil to 180 CR qhs -Low-sodium diet, exercise encouraged. CBC, CMP, TSH and LDL collected today     Gastroesophageal reflux disease, esophagitis presence not specified/long term use of ppi Stable Continue Protonix. Vitd, b12 and mag collected today   Hypercalcemia/Thyroid nodule - established with endocrine. Continue routine follow-ups. -Biopsy was benign.   -Continue to work on adequate hydration.  Elevated glucose: A1c collected today  Chronic back pain: Stable Continue Zanaflex 2 mg twice daily as needed. Encouraged her to take before bed.  Patient reports she rarely needs. Continue mobic qd  Meds ordered this encounter  Medications   meloxicam (MOBIC) 15 MG tablet    Sig: Take 1 tablet (15 mg total) by mouth daily.    Dispense:  90 tablet    Refill:  1   pantoprazole (PROTONIX) 20 MG tablet    Sig: Take 1 tablet (20 mg total) by mouth daily as needed.    Dispense:  90 tablet    Refill:  1   potassium chloride SA (KLOR-CON M) 20 MEQ tablet    Sig: Take 1 tablet (20 mEq total) by mouth daily. For more refills please call your Primary Care Doctor. No more refills from GI    Dispense:  90 tablet    Refill:  1   tizanidine (ZANAFLEX) 2 MG capsule    Sig: Take 1 capsule (2 mg total) by mouth 2 (two) times daily as needed for muscle spasms.    Dispense:  180 capsule    Refill:  1   triamterene-hydrochlorothiazide (MAXZIDE-25) 37.5-25 MG tablet    Sig: Take  1 tablet by mouth daily.    Dispense:  90 tablet    Refill:  1   verapamil (CALAN-SR) 180 MG CR tablet    Sig: Take 1 tablet (180 mg total) by mouth at bedtime.    Dispense:  90 tablet    Refill:  1   Orders Placed This Encounter  Procedures   CBC   Comp Met (CMET)   Vitamin D (25 hydroxy)   TSH   Hemoglobin A1c   Magnesium   B12   Direct LDL    Electronically Signed by: Shelly Pacini, DO [facility]- OR

## 2023-03-29 NOTE — Addendum Note (Signed)
Addended by: Emi Holes D on: 03/29/2023 01:52 PM   Modules accepted: Orders

## 2023-04-01 ENCOUNTER — Other Ambulatory Visit (INDEPENDENT_AMBULATORY_CARE_PROVIDER_SITE_OTHER): Payer: Medicare HMO

## 2023-04-01 ENCOUNTER — Telehealth: Payer: Self-pay | Admitting: Allergy

## 2023-04-01 DIAGNOSIS — Z5181 Encounter for therapeutic drug level monitoring: Secondary | ICD-10-CM

## 2023-04-01 DIAGNOSIS — R7309 Other abnormal glucose: Secondary | ICD-10-CM | POA: Diagnosis not present

## 2023-04-01 DIAGNOSIS — Z79899 Other long term (current) drug therapy: Secondary | ICD-10-CM

## 2023-04-01 DIAGNOSIS — I10 Essential (primary) hypertension: Secondary | ICD-10-CM

## 2023-04-01 DIAGNOSIS — E559 Vitamin D deficiency, unspecified: Secondary | ICD-10-CM

## 2023-04-01 DIAGNOSIS — K219 Gastro-esophageal reflux disease without esophagitis: Secondary | ICD-10-CM | POA: Diagnosis not present

## 2023-04-01 LAB — CBC
HCT: 40.5 % (ref 36.0–46.0)
Hemoglobin: 13.4 g/dL (ref 12.0–15.0)
MCHC: 33.1 g/dL (ref 30.0–36.0)
MCV: 88 fl (ref 78.0–100.0)
Platelets: 265 10*3/uL (ref 150.0–400.0)
RBC: 4.6 Mil/uL (ref 3.87–5.11)
RDW: 14.8 % (ref 11.5–15.5)
WBC: 6.2 10*3/uL (ref 4.0–10.5)

## 2023-04-01 LAB — COMPREHENSIVE METABOLIC PANEL
ALT: 14 U/L (ref 0–35)
AST: 15 U/L (ref 0–37)
Albumin: 4.1 g/dL (ref 3.5–5.2)
Alkaline Phosphatase: 61 U/L (ref 39–117)
BUN: 15 mg/dL (ref 6–23)
CO2: 27 mEq/L (ref 19–32)
Calcium: 10.4 mg/dL (ref 8.4–10.5)
Chloride: 101 mEq/L (ref 96–112)
Creatinine, Ser: 0.6 mg/dL (ref 0.40–1.20)
GFR: 87.78 mL/min (ref >60.00)
Glucose, Bld: 88 mg/dL (ref 70–99)
Potassium: 3.5 mEq/L (ref 3.5–5.1)
Sodium: 134 mEq/L — ABNORMAL LOW (ref 135–145)
Total Bilirubin: 0.5 mg/dL (ref 0.2–1.2)
Total Protein: 7.3 g/dL (ref 6.0–8.3)

## 2023-04-01 LAB — TSH: TSH: 0.53 u[IU]/mL (ref 0.35–5.50)

## 2023-04-01 LAB — VITAMIN B12: Vitamin B-12: 602 pg/mL (ref 211–911)

## 2023-04-01 LAB — HEMOGLOBIN A1C: Hgb A1c MFr Bld: 6.2 % (ref 4.6–6.5)

## 2023-04-01 LAB — MAGNESIUM: Magnesium: 1.9 mg/dL (ref 1.5–2.5)

## 2023-04-01 LAB — LDL CHOLESTEROL, DIRECT: Direct LDL: 92 mg/dL

## 2023-04-01 LAB — VITAMIN D 25 HYDROXY (VIT D DEFICIENCY, FRACTURES): VITD: 29.96 ng/mL — ABNORMAL LOW (ref 30.00–100.00)

## 2023-04-01 NOTE — Telephone Encounter (Signed)
I called the patient and left a message for her to call the [place] office back in regards to her symptoms.

## 2023-04-01 NOTE — Telephone Encounter (Signed)
Please call patient.  Is she using any nasal sprays? How about antihistamines?  She can use Flonase (fluticasone) nasal spray 1 spray per nostril twice a day as needed for nasal congestion.   She can also use afrin for a few days for severe nasal congestion. This is over the counter.

## 2023-04-01 NOTE — Telephone Encounter (Signed)
Shelly Leonard called in and states her nostrils are clogged and her allergies are making her feel terrible.  I offered her appointments for [facility] and [place].  None of the appointments I offered her, she was able to make.  Shelly Leonard wants to know if there is anything that can be called in for her. She would like to use the [facility] on [address].

## 2023-04-02 ENCOUNTER — Telehealth: Payer: Self-pay | Admitting: Family Medicine

## 2023-04-02 NOTE — Telephone Encounter (Signed)
Patient called back and stated that she has been using the Flonase nasal spray daily and Allegra only as needed. She has been using a decongestant instead on her daily allergy medication.  She is having issues with her breathing due to congestion and headaches only on the left side.   I did inform her that the decongestant should be used only as needed and not as a daily medication.   Patient plans to use the Flonase (fluticasone) nasal spray 1 spray per nostril twice a day as needed for nasal congestion.  Allegra daily and use a nasal saline rinse to help clear out some of the mucus.

## 2023-04-02 NOTE — Telephone Encounter (Signed)
LVM for pt to return call regarding results.

## 2023-04-02 NOTE — Telephone Encounter (Signed)
Please call patient.  If she's not feeling better with the below plan then schedule a sick visit - either with [place] or PCP or urgent care.  She can also try doing sinus rinses.

## 2023-04-02 NOTE — Telephone Encounter (Signed)
Called and left a voicemail asking for patient to return call to inform.  

## 2023-04-02 NOTE — Telephone Encounter (Signed)
Patient returned call about results. Spoke with patient regarding recommendation/results.  No further questions or comments at this time.

## 2023-04-02 NOTE — Telephone Encounter (Signed)
Please call patient Liver, kidney and thyroid function are normal Blood cell counts and electrolytes are normal Diabetes screening/A1c is still in the prediabetic range, but okay and stable. Cholesterol levels look good  Vitamin D is almost normal at 29.9.  Continue vitamin D supplement.

## 2023-04-02 NOTE — Telephone Encounter (Signed)
noted 

## 2023-04-03 ENCOUNTER — Telehealth: Payer: Self-pay | Admitting: Family Medicine

## 2023-04-03 NOTE — Telephone Encounter (Signed)
Pt would like a print out of her lab results. I informed her that she would receive a call to let her know when that is ready to pick up.

## 2023-04-03 NOTE — Telephone Encounter (Signed)
Labs printed and pt advised. 

## 2023-04-05 NOTE — Telephone Encounter (Signed)
Patient call back and  I informed her of everything that was stated in the message, patient stated understood and  she will continue Flonase 1 spray per nostril twice a day and allegra daily. She also stated she will use the nasal rinse and follow up if needed next week.

## 2023-04-23 ENCOUNTER — Ambulatory Visit (INDEPENDENT_AMBULATORY_CARE_PROVIDER_SITE_OTHER): Payer: Medicare HMO | Admitting: Family Medicine

## 2023-04-23 ENCOUNTER — Encounter: Payer: Self-pay | Admitting: Family Medicine

## 2023-04-23 VITALS — BP 165/92 | HR 79 | Temp 98.5°F | Wt 159.2 lb

## 2023-04-23 DIAGNOSIS — I10 Essential (primary) hypertension: Secondary | ICD-10-CM | POA: Diagnosis not present

## 2023-04-23 DIAGNOSIS — K5901 Slow transit constipation: Secondary | ICD-10-CM

## 2023-04-23 MED ORDER — VERAPAMIL HCL ER 240 MG PO TBCR: 240.0000 mg | EXTENDED_RELEASE_TABLET | Freq: Every day | ORAL | 1 refills | Status: DC

## 2023-04-23 MED ORDER — SENNA-DOCUSATE SODIUM 8.6-50 MG PO TABS: 1.0000 | ORAL_TABLET | Freq: Every day | ORAL | 5 refills | Status: DC

## 2023-04-23 MED ORDER — VERAPAMIL HCL ER 180 MG PO TBCR: 180.0000 mg | EXTENDED_RELEASE_TABLET | Freq: Every day | ORAL | 1 refills | Status: DC

## 2023-04-23 MED ORDER — OLMESARTAN MEDOXOMIL-HCTZ 20-12.5 MG PO TABS: 1.0000 | ORAL_TABLET | Freq: Every day | ORAL | 0 refills | Status: DC

## 2023-04-23 NOTE — Patient Instructions (Signed)
Continue nighttime verapamil (same dose) Start benicar  STOP maxide   Senakot at night for bowel movement regulation  2-3 weeks for BP recheck

## 2023-04-23 NOTE — Progress Notes (Unsigned)
Patient Care Team    Relationship Specialty Notifications Start End  Natalia Leatherwood, DO PCP - General Family Medicine  08/18/15   Sallye Lat, MD Consulting Physician Ophthalmology  05/08/16   Rachael Fee, MD Attending Physician Gastroenterology  05/08/16   Keturah Barre, MD Consulting Physician Otolaryngology  06/25/17   Gaylord Shih, [facility]  Specialist  07/01/18   Marlene Lard, MD Consulting Physician Endocrinology  01/28/20     Chief Complaint  Patient presents with   Hypertension    Wants to start omeprazole; states she was told by insurance that it helps empty her out. States she will not be going to GI because of location.    History of Present Ilness: Shelly Leonard, 77 y.o. , female presents today for pretension follow-up after change of meds and*** Essential hypertension, benign/morbid obesity Pt reports plans with Maxzide 37.5-25 and verapamil 180 mg nightly-which was increased last visit. Blood pressures ranges at home not routinely checked.  Patient denies chest pain, shortness of breath, dizziness or lower extremity edema.  Pt takes a daily baby ASA. Pt is not prescribed statin. Labs due Diet: Low sodium. Exercise: Routine exercise. RF: Hypertension, obesity, family history of heart disease   Past Medical History:  Diagnosis Date   Allergy    Anemia    low iron (hgb 8)   Arthritis    knees and hands    Carpal tunnel syndrome    Cataract    Diverticulitis large intestine w/o perforation or abscess w/bleeding    GERD (gastroesophageal reflux disease)    Headache(784.0)    occasional    Hypertension    Lumbar spondylosis    Osteopenia    PONV (postoperative nausea and vomiting)    Vertigo    Vitamin D deficiency    All allergies reviewed No Known Allergies Past Surgical History:  Procedure Laterality Date   BREAST REDUCTION SURGERY  1982   CARPAL TUNNEL RELEASE     left    COLONOSCOPY     FOOT SURGERY     KNEE ARTHROSCOPY     right  knee 2012    OTHER SURGICAL HISTORY     spur removed top of left foot    REDUCTION MAMMAPLASTY  1982   TOTAL KNEE ARTHROPLASTY  04/21/2012   Procedure: TOTAL KNEE ARTHROPLASTY;  Surgeon: [name], MD;  Location: [facility];  Service: Orthopedics;  Laterality: Right;   VAGINAL HYSTERECTOMY  1977   Family History  Problem Relation Age of Onset   Breast cancer Mother        late in life   Diabetes Mother        late in life   Heart disease Father    Lung disease Father    Colon cancer Neg Hx    Esophageal cancer Neg Hx    Stomach cancer Neg Hx    Rectal cancer Neg Hx    Social History   Social History Narrative   Ms Cuppett is widowed (12/2016).    She enjoys music and is involved with her church.    All medications verified Allergies as of 04/23/2023   No Known Allergies      Medication List        Accurate as of April 23, 2023  2:11 PM. If you have any questions, ask your nurse or doctor.          aspirin EC 81 MG tablet Take 81 mg by mouth daily.  fluticasone 50 MCG/ACT nasal spray Commonly known as: FLONASE USE 1 SPRAY(S) IN EACH NOSTRIL TWICE DAILY AS NEEDED FOR  NASAL  CONGESTION   gabapentin 300 MG capsule Commonly known as: NEURONTIN Take 300 mg by mouth at bedtime.   meloxicam 15 MG tablet Commonly known as: MOBIC Take 1 tablet (15 mg total) by mouth daily.   OMEGA 3 PO Take 1 capsule by mouth daily.   pantoprazole 20 MG tablet Commonly known as: PROTONIX Take 1 tablet (20 mg total) by mouth daily as needed.   potassium chloride SA 20 MEQ tablet Commonly known as: KLOR-CON M Take 1 tablet (20 mEq total) by mouth daily. For more refills please call your Primary Care Doctor. No more refills from GI   PROBIOTIC DAILY PO Take by mouth.   sennosides-docusate sodium 8.6-50 MG tablet Commonly known as: SENOKOT-S Take 1-2 tablets by mouth at bedtime. Started by: Felix Pacini, DO   tizanidine 2 MG capsule Commonly known as: Zanaflex Take 1  capsule (2 mg total) by mouth 2 (two) times daily as needed for muscle spasms.   traMADol 50 MG tablet Commonly known as: ULTRAM Take 50 mg by mouth every 6 (six) hours as needed.   triamterene-hydrochlorothiazide 37.5-25 MG tablet Commonly known as: MAXZIDE-25 Take 1 tablet by mouth daily.   verapamil 240 MG CR tablet Commonly known as: CALAN-SR Take 1 tablet (240 mg total) by mouth at bedtime. What changed:  medication strength how much to take Changed by: Felix Pacini, DO   VITAMIN D PO Take by mouth.   Xiidra 5 % Soln Generic drug: Lifitegrast Apply 1 drop to eye 2 (two) times daily.           04/23/2023    1:41 PM 09/12/2022   10:06 AM 07/31/2022    2:00 PM 08/30/2021   10:19 AM 02/10/2021    1:07 PM  Depression screen PHQ 2/9  Decreased Interest 0 0 0 0 0  Down, Depressed, Hopeless 1 0 0 0 0  PHQ - 2 Score 1 0 0 0 0       No data to display           Immunizations: Immunization History  Administered Date(s) Administered   PFIZER(Purple Top)SARS-COV-2 Vaccination 03/17/2020, 04/12/2020, 11/30/2020   PNEUMOCOCCAL CONJUGATE-20 12/26/2022   Pfizer Covid-19 Vaccine Bivalent Booster 69yrs & up 10/25/2021   Pneumococcal Conjugate-13 04/01/2014   Pneumococcal Polysaccharide-23 11/29/2015   Td 08/14/2011   Tdap 03/14/2022   Zoster Recombinat (Shingrix) 08/15/2022, 11/14/2022   Zoster, Live 01/20/2013   Objective:  BP (!) 178/102   Pulse 79   Temp 98.5 F (36.9 C)   Wt 159 lb 3.2 oz (72.2 kg)   SpO2 100%   BMI 29.12 kg/m  Physical Exam Vitals and nursing note reviewed.  Constitutional:      General: She is not in acute distress.    Appearance: Normal appearance. She is normal weight. She is not ill-appearing or toxic-appearing.  HENT:     Head: Normocephalic and atraumatic.  Eyes:     General: No scleral icterus.       Right eye: No discharge.        Left eye: No discharge.     Extraocular Movements: Extraocular movements intact.      Conjunctiva/sclera: Conjunctivae normal.     Pupils: Pupils are equal, round, and reactive to light.  Cardiovascular:     Rate and Rhythm: Normal rate and regular rhythm.     Heart  sounds: No murmur heard. Pulmonary:     Effort: Pulmonary effort is normal.  Musculoskeletal:     Right lower leg: No edema.     Left lower leg: No edema.  Skin:    Findings: No rash.  Neurological:     Mental Status: She is alert and oriented to person, place, and time. Mental status is at baseline.     Motor: No weakness.     Coordination: Coordination normal.     Gait: Gait normal.  Psychiatric:        Mood and Affect: Mood normal.        Behavior: Behavior normal.        Thought Content: Thought content normal.        Judgment: Judgment normal.     Assessment and Plan Shelly Leonard is a 77 y.o. female present for [facility]  Essential hypertension, benign *** Continue Maxide 37.5-25 ***Verapamil to 180 CR qhs -Low-sodium diet, exercise encouraged.    Meds ordered this encounter  Medications   verapamil (CALAN-SR) 240 MG CR tablet    Sig: Take 1 tablet (240 mg total) by mouth at bedtime.    Dispense:  90 tablet    Refill:  1   sennosides-docusate sodium (SENOKOT-S) 8.6-50 MG tablet    Sig: Take 1-2 tablets by mouth at bedtime.    Dispense:  60 tablet    Refill:  5   No orders of the defined types were placed in this encounter.   Electronically Signed by: Felix Pacini, DO [facility]- OR

## 2023-04-25 ENCOUNTER — Other Ambulatory Visit: Payer: Self-pay

## 2023-04-25 ENCOUNTER — Encounter: Payer: Self-pay | Admitting: Allergy

## 2023-04-25 ENCOUNTER — Ambulatory Visit: Payer: Medicare HMO | Admitting: Allergy

## 2023-04-25 VITALS — BP 150/98 | HR 82 | Temp 97.9°F | Resp 18 | Ht 61.5 in | Wt 159.2 lb

## 2023-04-25 DIAGNOSIS — J329 Chronic sinusitis, unspecified: Secondary | ICD-10-CM

## 2023-04-25 DIAGNOSIS — R030 Elevated blood-pressure reading, without diagnosis of hypertension: Secondary | ICD-10-CM | POA: Diagnosis not present

## 2023-04-25 DIAGNOSIS — J302 Other seasonal allergic rhinitis: Secondary | ICD-10-CM | POA: Diagnosis not present

## 2023-04-25 DIAGNOSIS — R519 Headache, unspecified: Secondary | ICD-10-CM | POA: Diagnosis not present

## 2023-04-25 DIAGNOSIS — J3089 Other allergic rhinitis: Secondary | ICD-10-CM

## 2023-04-25 DIAGNOSIS — K5901 Slow transit constipation: Secondary | ICD-10-CM | POA: Insufficient documentation

## 2023-04-25 NOTE — Progress Notes (Signed)
Follow Up Note  RE: ESSENCE MERLE MRN: [id number] DOB: 1946/04/25 Date of Office Visit: 04/25/2023  Referring provider: Natalia Leatherwood, DO Primary care provider: Natalia Leatherwood, DO  Chief Complaint: Sinusitis (Has been feeling sick for about 3 weeks. Is feeling sick to her stomach, headaches, eyes hurt. )  History of Present Illness: I had the pleasure of seeing Shelly Leonard for a follow up visit at the [facility] on 04/25/2023. She is a 77 y.o. female, who is being followed for allergic rhinitis. Her previous allergy office visit was on 11/20/2022 with Dr. Selena Batten. Today is a new complaint visit of congestion .  Patient has been having sinus pressure, migraines, nasal congestion for the past 3 weeks.  She is not sure what triggered this episode.  She is not sleeping well and not eating as much food due to loss of appetite.  Patient has h/o migraines and used to take medications for this but doesn't have anymore. Denies any fevers/chills. No nasal congestion.   Currently taking Flonase daily, saline rinse, allegra, otc decongestant with no benefit.   Her BP meds were adjusted and has high BP in the office.  She can't take NSAIDs. She has been taking 2 Tylenols for the headaches as needed.   Assessment and Plan: Shelly Leonard is a 77 y.o. female with: Generalized headache H/o migraines and complaining of headaches, sinus pressure x 3 weeks. No fevers. No drainage. Her BP meds were recently changed but still elevated in the office. Taking Tylenol for pain. Discussed with patient at length that I don't think she needs prednisone or antibiotics for her current symptoms. I don't think she has an acute sinus infection.  Her headaches are most likely from the elevated blood pressure but she states that her blood pressure is high because she is having headaches. Advised her to follow up with PCP regarding the headaches/BP. Recommend neurology evaluation given h/o  migraines. Take Tylenol as needed.  Elevated blood pressure reading See assessment and plan as above.  Chronic sinusitis Take Afrin nasal spray before going to bed for the severe nasal congestion.  Get CT of sinus - not sure if she will get it due to cost.  Don't take pill decongestants as it will make the blood pressure worse. Discussed ENT referral but patient not wanting to travel far to see them.   Seasonal and perennial allergic rhinitis Past history - 2022 skin testing was positive to dust mites and tree pollen. Xhance not covered. Continue environmental control measures as below. Use Flonase (fluticasone) nasal spray 1 spray per nostril twice a day as needed for nasal congestion.  Nasal saline spray (i.e., Simply Saline) or nasal saline lavage (i.e., NeilMed) is recommended as needed and prior to medicated nasal sprays. Use over the counter antihistamines such as Zyrtec (cetirizine), Claritin (loratadine), Allegra (fexofenadine), or Xyzal (levocetirizine) daily as needed. May switch antihistamines every few months.  Return in about 6 months (around 10/26/2023).  No orders of the defined types were placed in this encounter.  Lab Orders  No laboratory test(s) ordered today    Diagnostics: None.   Medication List:  Current Outpatient Medications  Medication Sig Dispense Refill   aspirin EC 81 MG tablet Take 81 mg by mouth daily.     Cholecalciferol (VITAMIN D PO) Take by mouth.     fluticasone (FLONASE) 50 MCG/ACT nasal spray USE 1 SPRAY(S) IN EACH NOSTRIL TWICE DAILY AS NEEDED FOR  NASAL  CONGESTION  16 g 5   gabapentin (NEURONTIN) 300 MG capsule Take 300 mg by mouth at bedtime.     meloxicam (MOBIC) 15 MG tablet Take 1 tablet (15 mg total) by mouth daily. 90 tablet 1   olmesartan-hydrochlorothiazide (BENICAR HCT) 20-12.5 MG tablet Take 1 tablet by mouth daily. 30 tablet 0   Omega-3 Fatty Acids (OMEGA 3 PO) Take 1 capsule by mouth daily.     pantoprazole (PROTONIX) 20 MG  tablet Take 1 tablet (20 mg total) by mouth daily as needed. 90 tablet 1   potassium chloride SA (KLOR-CON M) 20 MEQ tablet Take 1 tablet (20 mEq total) by mouth daily. For more refills please call your Primary Care Doctor. No more refills from GI 90 tablet 1   Probiotic Product (PROBIOTIC DAILY PO) Take by mouth.     sennosides-docusate sodium (SENOKOT-S) 8.6-50 MG tablet Take 1-2 tablets by mouth at bedtime. 60 tablet 5   tizanidine (ZANAFLEX) 2 MG capsule Take 1 capsule (2 mg total) by mouth 2 (two) times daily as needed for muscle spasms. 180 capsule 1   traMADol (ULTRAM) 50 MG tablet Take 50 mg by mouth every 6 (six) hours as needed.     verapamil (CALAN-SR) 180 MG CR tablet Take 1 tablet (180 mg total) by mouth at bedtime. 90 tablet 1   XIIDRA 5 % SOLN Apply 1 drop to eye 2 (two) times daily.     No current facility-administered medications for this visit.   Allergies: No Known Allergies I reviewed her past medical history, social history, family history, and environmental history and no significant changes have been reported from her previous visit.  Review of Systems  Constitutional:  Negative for appetite change, chills, fever and unexpected weight change.  HENT:  Positive for congestion and sinus pressure. Negative for rhinorrhea.   Eyes:  Negative for itching.  Respiratory:  Negative for cough, chest tightness, shortness of breath and wheezing.   Gastrointestinal:  Negative for abdominal pain.  Skin:  Negative for rash.  Allergic/Immunologic: Positive for environmental allergies.  Neurological:  Positive for headaches.    Objective: BP (!) 150/98   Pulse 82   Temp 97.9 F (36.6 C)   Resp 18   Ht 5' 1.5" (1.562 m)   Wt 159 lb 4 oz (72.2 kg)   SpO2 96%   BMI 29.60 kg/m  Body mass index is 29.6 kg/m. Physical Exam Vitals and nursing note reviewed.  Constitutional:      Appearance: Normal appearance. She is well-developed.  HENT:     Head: Normocephalic and  atraumatic.     Right Ear: Tympanic membrane and external ear normal.     Left Ear: Tympanic membrane and external ear normal.     Nose: Nose normal.     Mouth/Throat:     Mouth: Mucous membranes are moist.     Pharynx: Oropharynx is clear.  Eyes:     Conjunctiva/sclera: Conjunctivae normal.  Cardiovascular:     Rate and Rhythm: Normal rate and regular rhythm.     Heart sounds: Normal heart sounds. No murmur heard. Pulmonary:     Effort: Pulmonary effort is normal.     Breath sounds: Normal breath sounds. No wheezing, rhonchi or rales.  Musculoskeletal:     Cervical back: Neck supple.  Skin:    General: Skin is warm.     Findings: No rash.  Neurological:     Mental Status: She is alert and oriented to person, place, and time.  Psychiatric:        Behavior: Behavior normal.    Previous notes and tests were reviewed. The plan was reviewed with the patient/family, and all questions/concerned were addressed.  It was my pleasure to see Shelly Leonard today and participate in her care. Please feel free to contact me with any questions or concerns.  Sincerely,  Wyline Mood, DO Allergy & Immunology  [facility] office: [phone] [facility] office: [phone]

## 2023-04-25 NOTE — Assessment & Plan Note (Signed)
Take Afrin nasal spray before going to bed for the severe nasal congestion.  Get CT of sinus - not sure if she will get it due to cost.  Don't take pill decongestants as it will make the blood pressure worse. Discussed ENT referral but patient not wanting to travel far to see them.

## 2023-04-25 NOTE — Assessment & Plan Note (Signed)
Past history - 2022 skin testing was positive to dust mites and tree pollen. Xhance not covered. Continue environmental control measures as below. Use Flonase (fluticasone) nasal spray 1 spray per nostril twice a day as needed for nasal congestion.  Nasal saline spray (i.e., Simply Saline) or nasal saline lavage (i.e., NeilMed) is recommended as needed and prior to medicated nasal sprays. Use over the counter antihistamines such as Zyrtec (cetirizine), Claritin (loratadine), Allegra (fexofenadine), or Xyzal (levocetirizine) daily as needed. May switch antihistamines every few months.

## 2023-04-25 NOTE — Assessment & Plan Note (Signed)
H/o migraines and complaining of headaches, sinus pressure x 3 weeks. No fevers. No drainage. Her BP meds were recently changed but still elevated in the office. Taking Tylenol for pain. Discussed with patient at length that I don't think she needs prednisone or antibiotics for her current symptoms. I don't think she has an acute sinus infection.  Her headaches are most likely from the elevated blood pressure but she states that her blood pressure is high because she is having headaches. Advised her to follow up with PCP regarding the headaches/BP. Recommend neurology evaluation given h/o migraines. Take Tylenol as needed.

## 2023-04-25 NOTE — Assessment & Plan Note (Signed)
.   See assessment and plan as above. 

## 2023-04-25 NOTE — Patient Instructions (Signed)
Headaches/migraines Please follow up with PCP regarding this. Your blood pressure was also high that can make the headaches worse. Take Tylenol as needed. I don't think you need any antibiotics or prednisone for this. Recommend seeing a neurologist.   Nasal congestion Take Afrin nasal spray before going to bed.  Get CT of sinus.  Don't take pill decongestants as it will make the blood pressure worse.   Environmental allergies 2022 skin testing was positive to dust mites and tree pollen. Continue environmental control measures as below. Use Flonase (fluticasone) nasal spray 1 spray per nostril twice a day as needed for nasal congestion.  Nasal saline spray (i.e., Simply Saline) or nasal saline lavage (i.e., NeilMed) is recommended as needed and prior to medicated nasal sprays.  Use over the counter antihistamines such as Zyrtec (cetirizine), Claritin (loratadine), Allegra (fexofenadine), or Xyzal (levocetirizine) daily as needed. May switch antihistamines every few months.  Follow up in 6 months or sooner if needed.    Control of House Dust Mite Allergen Dust mite allergens are a common trigger of allergy and asthma symptoms. While they can be found throughout the house, these microscopic creatures thrive in warm, humid environments such as bedding, upholstered furniture and carpeting. Because so much time is spent in the bedroom, it is essential to reduce mite levels there.  Encase pillows, mattresses, and box springs in special allergen-proof fabric covers or airtight, zippered plastic covers.  Bedding should be washed weekly in hot water (130 F) and dried in a hot dryer. Allergen-proof covers are available for comforters and pillows that can't be regularly washed.  Wash the allergy-proof covers every few months. Minimize clutter in the bedroom. Keep pets out of the bedroom.  Keep humidity less than 50% by using a dehumidifier or air conditioning. You can buy a humidity measuring device  called a hygrometer to monitor this.  If possible, replace carpets with hardwood, linoleum, or washable area rugs. If that's not possible, vacuum frequently with a vacuum that has a HEPA filter. Remove all upholstered furniture and non-washable window drapes from the bedroom. Remove all non-washable stuffed toys from the bedroom.  Wash stuffed toys weekly. Reducing Pollen Exposure Pollen seasons: trees (spring), grass (summer) and ragweed/weeds (fall). Keep windows closed in your home and car to lower pollen exposure.  Install air conditioning in the bedroom and throughout the house if possible.  Avoid going out in dry windy days - especially early morning. Pollen counts are highest between 5 - 10 AM and on dry, hot and windy days.  Save outside activities for late afternoon or after a heavy rain, when pollen levels are lower.  Avoid mowing of grass if you have grass pollen allergy. Be aware that pollen can also be transported indoors on people and pets.  Dry your clothes in an automatic dryer rather than hanging them outside where they might collect pollen.  Rinse hair and eyes before bedtime.

## 2023-05-07 ENCOUNTER — Telehealth (HOSPITAL_BASED_OUTPATIENT_CLINIC_OR_DEPARTMENT_OTHER): Payer: Self-pay

## 2023-05-15 ENCOUNTER — Ambulatory Visit (INDEPENDENT_AMBULATORY_CARE_PROVIDER_SITE_OTHER): Payer: Medicare HMO | Admitting: Family Medicine

## 2023-05-15 VITALS — BP 139/82 | HR 91 | Temp 97.7°F | Wt 161.4 lb

## 2023-05-15 DIAGNOSIS — I10 Essential (primary) hypertension: Secondary | ICD-10-CM | POA: Diagnosis not present

## 2023-05-15 MED ORDER — OLMESARTAN MEDOXOMIL-HCTZ 20-12.5 MG PO TABS: 1.0000 | ORAL_TABLET | Freq: Every day | ORAL | 1 refills | Status: DC

## 2023-05-15 NOTE — Patient Instructions (Addendum)
Return in about 17 weeks (around 09/11/2023) for Routine chronic condition follow-up.        Great to see you today.  I have refilled the medication(s) we provide.

## 2023-05-15 NOTE — Progress Notes (Signed)
Patient Care Team    Relationship Specialty Notifications Start End  Shelly Leatherwood, DO PCP - General Family Medicine  08/18/15   Shelly Lat, MD Consulting Physician Ophthalmology  05/08/16   Shelly Fee, MD Attending Physician Gastroenterology  05/08/16   Shelly Barre, MD Consulting Physician Otolaryngology  06/25/17   Shelly Leonard, Emerge  Specialist  07/01/18   Shelly Lard, MD Consulting Physician Endocrinology  01/28/20     Chief Complaint  Patient presents with   Hypertension    History of Present Ilness: Shelly Leonard, 77 y.o. , female presents today for hypertension follow-up after change of med Essential hypertension, benign/morbid obesity Pt reports compliance with olmesartan-HCTZ 20-12.5, verapamil to 180 CR qhs. Blood pressures ranges at home are elevated in the systolics of better Patient denies chest pain, shortness of breath, dizziness or lower extremity edema.  Pt takes a daily baby ASA. Pt is not prescribed statin. Labs due Diet: Low sodium. Exercise: Routine exercise. RF: Hypertension, obesity, family history of heart disease   Past Medical History:  Diagnosis Date   Allergy    Anemia    low iron (hgb 8)   Arthritis    knees and hands    Carpal tunnel syndrome    Cataract    Diverticulitis large intestine w/o perforation or abscess w/bleeding    GERD (gastroesophageal reflux disease)    Headache(784.0)    occasional    Hypertension    Lumbar spondylosis    Osteopenia    PONV (postoperative nausea and vomiting)    Vertigo    Vitamin D deficiency    All allergies reviewed No Known Allergies Past Surgical History:  Procedure Laterality Date   BREAST REDUCTION SURGERY  1982   CARPAL TUNNEL RELEASE     left    COLONOSCOPY     FOOT SURGERY     KNEE ARTHROSCOPY     right knee 2012    OTHER SURGICAL HISTORY     spur removed top of left foot    REDUCTION MAMMAPLASTY  1982   TOTAL KNEE ARTHROPLASTY  04/21/2012   Procedure: TOTAL  KNEE ARTHROPLASTY;  Surgeon: [name], MD;  Location: [facility];  Service: Orthopedics;  Laterality: Right;   VAGINAL HYSTERECTOMY  1977   Family History  Problem Relation Age of Onset   Breast cancer Mother        late in life   Diabetes Mother        late in life   Heart disease Father    Lung disease Father    Colon cancer Neg Hx    Esophageal cancer Neg Hx    Stomach cancer Neg Hx    Rectal cancer Neg Hx    Social History   Social History Narrative   Shelly Leonard is widowed (12/2016).    She enjoys music and is involved with her church.    All medications verified Allergies as of 05/15/2023   No Known Allergies      Medication List        Accurate as of May 15, 2023 10:58 AM. If you have any questions, ask your nurse or doctor.          aspirin EC 81 MG tablet Take 81 mg by mouth daily.   fluticasone 50 MCG/ACT nasal spray Commonly known as: FLONASE USE 1 SPRAY(S) IN EACH NOSTRIL TWICE DAILY AS NEEDED FOR  NASAL  CONGESTION   gabapentin 300 MG capsule Commonly known  as: NEURONTIN Take 300 mg by mouth at bedtime.   meloxicam 15 MG tablet Commonly known as: MOBIC Take 1 tablet (15 mg total) by mouth daily.   olmesartan-hydrochlorothiazide 20-12.5 MG tablet Commonly known as: Benicar HCT Take 1 tablet by mouth daily.   OMEGA 3 PO Take 1 capsule by mouth daily.   pantoprazole 20 MG tablet Commonly known as: PROTONIX Take 1 tablet (20 mg total) by mouth daily as needed.   potassium chloride SA 20 MEQ tablet Commonly known as: KLOR-CON M Take 1 tablet (20 mEq total) by mouth daily. For more refills please call your Primary Care Doctor. No more refills from GI   PROBIOTIC DAILY PO Take by mouth.   sennosides-docusate sodium 8.6-50 MG tablet Commonly known as: SENOKOT-S Take 1-2 tablets by mouth at bedtime.   tizanidine 2 MG capsule Commonly known as: Zanaflex Take 1 capsule (2 mg total) by mouth 2 (two) times daily as needed for muscle  spasms.   traMADol 50 MG tablet Commonly known as: ULTRAM Take 50 mg by mouth every 6 (six) hours as needed.   verapamil 180 MG CR tablet Commonly known as: CALAN-SR Take 1 tablet (180 mg total) by mouth at bedtime.   VITAMIN D PO Take by mouth.   Xiidra 5 % Soln Generic drug: Lifitegrast Apply 1 drop to eye 2 (two) times daily.           04/23/2023    1:41 PM 09/12/2022   10:06 AM 07/31/2022    2:00 PM 08/30/2021   10:19 AM 02/10/2021    1:07 PM  Depression screen PHQ 2/9  Decreased Interest 0 0 0 0 0  Down, Depressed, Hopeless 1 0 0 0 0  PHQ - 2 Score 1 0 0 0 0       No data to display           Immunizations: Immunization History  Administered Date(s) Administered   PFIZER(Purple Top)SARS-COV-2 Vaccination 03/17/2020, 04/12/2020, 11/30/2020   PNEUMOCOCCAL CONJUGATE-20 12/26/2022   Pfizer Covid-19 Vaccine Bivalent Booster 20yrs & up 10/25/2021   Pneumococcal Conjugate-13 04/01/2014   Pneumococcal Polysaccharide-23 11/29/2015   Td 08/14/2011   Tdap 03/14/2022   Zoster Recombinat (Shingrix) 08/15/2022, 11/14/2022   Zoster, Live 01/20/2013   Objective:  BP 139/82   Pulse 91   Temp 97.7 F (36.5 C)   Wt 161 lb 6.4 oz (73.2 kg)   SpO2 99%   BMI 30.00 kg/m  Physical Exam Vitals and nursing note reviewed.  Constitutional:      General: She is not in acute distress.    Appearance: Normal appearance. She is normal weight. She is not ill-appearing or toxic-appearing.  HENT:     Head: Normocephalic and atraumatic.  Eyes:     General: No scleral icterus.       Right eye: No discharge.        Left eye: No discharge.     Extraocular Movements: Extraocular movements intact.     Conjunctiva/sclera: Conjunctivae normal.     Pupils: Pupils are equal, round, and reactive to light.  Cardiovascular:     Rate and Rhythm: Normal rate and regular rhythm.     Heart sounds: No murmur heard. Pulmonary:     Effort: Pulmonary effort is normal.  Musculoskeletal:      Right lower leg: No edema.     Left lower leg: No edema.  Skin:    Findings: No rash.  Neurological:     Mental Status: She  is alert and oriented to person, place, and time. Mental status is at baseline.     Motor: No weakness.     Coordination: Coordination normal.     Gait: Gait normal.  Psychiatric:        Mood and Affect: Mood normal.        Behavior: Behavior normal.        Thought Content: Thought content normal.        Judgment: Judgment normal.     Assessment and Plan Shelly Leonard is a 77 y.o. female present for [facility]  Essential hypertension, benign stable Continue olmesartan-HCTZ 20-12.5 Continue verapamil to 180 CR qhs (may increase next visit if needed) -Low-sodium diet, exercise encouraged. Recheck cmp next visit.   Return in about 17 weeks (around 09/11/2023) for Routine chronic condition follow-up.  Meds ordered this encounter  Medications   olmesartan-hydrochlorothiazide (BENICAR HCT) 20-12.5 MG tablet    Sig: Take 1 tablet by mouth daily.    Dispense:  90 tablet    Refill:  1   No orders of the defined types were placed in this encounter.   Electronically Signed by: Felix Pacini, DO [facility]- OR

## 2023-05-17 ENCOUNTER — Telehealth (HOSPITAL_BASED_OUTPATIENT_CLINIC_OR_DEPARTMENT_OTHER): Payer: Self-pay

## 2023-05-25 ENCOUNTER — Telehealth (HOSPITAL_BASED_OUTPATIENT_CLINIC_OR_DEPARTMENT_OTHER): Payer: Self-pay

## 2023-05-29 ENCOUNTER — Telehealth: Payer: Self-pay | Admitting: Family Medicine

## 2023-05-29 NOTE — Telephone Encounter (Signed)
Patient reports that stool softner with fiber has made her constipated and she is asking for something else to be suggested.  Please give the patient a call to discuss potential alternatives.

## 2023-05-29 NOTE — Telephone Encounter (Signed)
Senakot was called in for her 4/30, 2 tabs before bed. This is not a fiber, this is a bowel stimulant and stool softener together.  If she is taking that med and still constipated, then add 1 cap miralax daily in 8 ounces of water.

## 2023-05-29 NOTE — Telephone Encounter (Signed)
Spoke with patient regarding results/recommendations. Advised pt that if recommendations still do not help to schedule appt with PCP or she will need to see her GI team

## 2023-06-07 ENCOUNTER — Encounter: Payer: Self-pay | Admitting: Family Medicine

## 2023-06-07 ENCOUNTER — Ambulatory Visit (INDEPENDENT_AMBULATORY_CARE_PROVIDER_SITE_OTHER): Payer: Medicare HMO | Admitting: Family Medicine

## 2023-06-07 VITALS — BP 138/88 | HR 88 | Temp 98.1°F

## 2023-06-07 DIAGNOSIS — R1013 Epigastric pain: Secondary | ICD-10-CM

## 2023-06-07 LAB — H. PYLORI ANTIBODY, IGG: H Pylori IgG: NEGATIVE

## 2023-06-07 LAB — CBC WITH DIFFERENTIAL/PLATELET
Basophils Relative: 0.6 %
Eosinophils Relative: 3.7 %
Lymphs Abs: 2509 cells/uL (ref 850–3900)
MCV: 87.5 fL (ref 80.0–100.0)
Monocytes Relative: 8.2 %
Neutrophils Relative %: 50.6 %

## 2023-06-07 MED ORDER — ONDANSETRON 4 MG PO TBDP: 4.0000 mg | ORAL_TABLET | Freq: Three times a day (TID) | ORAL | 0 refills | Status: DC | PRN

## 2023-06-07 MED ORDER — PANTOPRAZOLE SODIUM 40 MG PO TBEC: 40.0000 mg | DELAYED_RELEASE_TABLET | Freq: Every day | ORAL | 1 refills | Status: DC | PRN

## 2023-06-07 NOTE — Progress Notes (Signed)
Shelly Leonard , October 06, 1946, 77 y.o., female MRN: [id number] Patient Care Team    Relationship Specialty Notifications Start End  Natalia Leatherwood, DO PCP - General Family Medicine  08/18/15   Sallye Lat, MD Consulting Physician Ophthalmology  05/08/16   Rachael Fee, MD Attending Physician Gastroenterology  05/08/16   Keturah Barre, MD Consulting Physician Otolaryngology  06/25/17   Gaylord Shih, Emerge  Specialist  07/01/18   Marlene Lard, MD Consulting Physician Endocrinology  01/28/20     Chief Complaint  Patient presents with   Abdominal Pain    Several days; bloating     Subjective: Shelly Leonard is a 77 y.o. female Pt presents for an OV with complaints of epigastric pain.  Patient reports symptoms started 5 days ago. She has a history of diverticulitis, that particular type of pain usually is in her left lower quadrant and is not the same as what she is experienced today.  She does have a history of reflux and takes Protonix 20 mg daily.  She does endorse having some heartburning features and burning sensations in her stomach.  She does suffer from constipation on occasions, but states she has been having bowel movements although only once a day.  She reports she is extremely nauseated and does not have an appetite.  She reports the symptoms do not worsen with a meal.  She has been eating bland foods.  She states her weight is stable.  She has a gastroenterology appointment in 2 weeks.     04/23/2023    1:41 PM 09/12/2022   10:06 AM 07/31/2022    2:00 PM 08/30/2021   10:19 AM 02/10/2021    1:07 PM  Depression screen PHQ 2/9  Decreased Interest 0 0 0 0 0  Down, Depressed, Hopeless 1 0 0 0 0  PHQ - 2 Score 1 0 0 0 0    No Known Allergies Social History   Social History Narrative   Shelly Leonard is widowed (12/2016).    She enjoys music and is involved with her church.   Past Medical History:  Diagnosis Date   Allergy    Anemia    low iron (hgb 8)   Arthritis     knees and hands    Carpal tunnel syndrome    Cataract    Diverticulitis large intestine w/o perforation or abscess w/bleeding    GERD (gastroesophageal reflux disease)    Headache(784.0)    occasional    Hypertension    Lumbar spondylosis    Osteopenia    PONV (postoperative nausea and vomiting)    Vertigo    Vitamin D deficiency    Past Surgical History:  Procedure Laterality Date   BREAST REDUCTION SURGERY  1982   CARPAL TUNNEL RELEASE     left    COLONOSCOPY     FOOT SURGERY     KNEE ARTHROSCOPY     right knee 2012    OTHER SURGICAL HISTORY     spur removed top of left foot    REDUCTION MAMMAPLASTY  1982   TOTAL KNEE ARTHROPLASTY  04/21/2012   Procedure: TOTAL KNEE ARTHROPLASTY;  Surgeon: [name], MD;  Location: [facility];  Service: Orthopedics;  Laterality: Right;   VAGINAL HYSTERECTOMY  1977   Family History  Problem Relation Age of Onset   Breast cancer Mother        late in life   Diabetes Mother  late in life   Heart disease Father    Lung disease Father    Colon cancer Neg Hx    Esophageal cancer Neg Hx    Stomach cancer Neg Hx    Rectal cancer Neg Hx    Allergies as of 06/07/2023   No Known Allergies      Medication List        Accurate as of June 07, 2023  2:40 PM. If you have any questions, ask your nurse or doctor.          aspirin EC 81 MG tablet Take 81 mg by mouth daily.   fluticasone 50 MCG/ACT nasal spray Commonly known as: FLONASE USE 1 SPRAY(S) IN EACH NOSTRIL TWICE DAILY AS NEEDED FOR  NASAL  CONGESTION   gabapentin 300 MG capsule Commonly known as: NEURONTIN Take 300 mg by mouth at bedtime.   meloxicam 15 MG tablet Commonly known as: MOBIC Take 1 tablet (15 mg total) by mouth daily.   olmesartan-hydrochlorothiazide 20-12.5 MG tablet Commonly known as: Benicar HCT Take 1 tablet by mouth daily.   OMEGA 3 PO Take 1 capsule by mouth daily.   ondansetron 4 MG disintegrating tablet Commonly known as:  ZOFRAN-ODT Take 1 tablet (4 mg total) by mouth every 8 (eight) hours as needed for nausea or vomiting. Started by: Felix Pacini, DO   pantoprazole 40 MG tablet Commonly known as: PROTONIX Take 1 tablet (40 mg total) by mouth daily as needed. What changed:  medication strength how much to take Changed by: Felix Pacini, DO   potassium chloride SA 20 MEQ tablet Commonly known as: KLOR-CON M Take 1 tablet (20 mEq total) by mouth daily. For more refills please call your Primary Care Doctor. No more refills from GI   PROBIOTIC DAILY PO Take by mouth.   sennosides-docusate sodium 8.6-50 MG tablet Commonly known as: SENOKOT-S Take 1-2 tablets by mouth at bedtime.   tizanidine 2 MG capsule Commonly known as: Zanaflex Take 1 capsule (2 mg total) by mouth 2 (two) times daily as needed for muscle spasms.   traMADol 50 MG tablet Commonly known as: ULTRAM Take 50 mg by mouth every 6 (six) hours as needed.   verapamil 180 MG CR tablet Commonly known as: CALAN-SR Take 1 tablet (180 mg total) by mouth at bedtime.   VITAMIN D PO Take by mouth.   Xiidra 5 % Soln Generic drug: Lifitegrast Apply 1 drop to eye 2 (two) times daily.        All past medical history, surgical history, allergies, family history, immunizations andmedications were updated in the EMR today and reviewed under the history and medication portions of their EMR.     ROS Negative, with the exception of above mentioned in HPI   Objective:  BP 138/88   Pulse 88   Temp 98.1 F (36.7 C)   SpO2 97%  There is no height or weight on file to calculate BMI. Physical Exam Vitals and nursing note reviewed.  Constitutional:      General: She is not in acute distress.    Appearance: Normal appearance. She is normal weight. She is not ill-appearing or toxic-appearing.  HENT:     Head: Normocephalic and atraumatic.  Eyes:     General: No scleral icterus.       Right eye: No discharge.        Left eye: No discharge.      Extraocular Movements: Extraocular movements intact.     Conjunctiva/sclera: Conjunctivae  normal.     Pupils: Pupils are equal, round, and reactive to light.  Abdominal:     General: Abdomen is protuberant. Bowel sounds are normal. There is no distension.     Palpations: Abdomen is soft. There is no shifting dullness or mass.     Tenderness: There is abdominal tenderness in the epigastric area. There is no guarding or rebound. Negative signs include Murphy's sign and McBurney's sign.  Skin:    Findings: No rash.  Neurological:     Mental Status: She is alert and oriented to person, place, and time. Mental status is at baseline.     Motor: No weakness.     Coordination: Coordination normal.     Gait: Gait normal.  Psychiatric:        Mood and Affect: Mood normal.        Behavior: Behavior normal.        Thought Content: Thought content normal.        Judgment: Judgment normal.     No results found. No results found. No results found for this or any previous visit (from the past 24 hour(s)).  Assessment/Plan: BERKLI GARNTO is a 77 y.o. female present for OV for  Epigastric pain Increase Protonix to 40 mg daily.  Continue bland diet. Zofran for nausea Suspect possible gastritis or viral illness as cause.  Would elect to call in Carafate as well, but she struggles with constipation therefore this may make it worse.  Will hold on Carafate for now while we await labs CBC, CMP, lipase, H. pylori and CRP collected today. Patient understands if fever, chills or worsening abdominal pain occurs over the weekend she will need to report to the emergency room. Follow-up with gastroenterology in 2 weeks at scheduled appointment, or here if needed sooner  Reviewed expectations re: course of current medical issues. Discussed self-management of symptoms. Outlined signs and symptoms indicating need for more acute intervention. Patient verbalized understanding and all questions were  answered. Patient received an After-Visit Summary.    Orders Placed This Encounter  Procedures   C-reactive protein   CBC w/Diff   Comp Met (CMET)   Lipase   H. pylori antibody, IgG   Meds ordered this encounter  Medications   ondansetron (ZOFRAN-ODT) 4 MG disintegrating tablet    Sig: Take 1 tablet (4 mg total) by mouth every 8 (eight) hours as needed for nausea or vomiting.    Dispense:  20 tablet    Refill:  0   pantoprazole (PROTONIX) 40 MG tablet    Sig: Take 1 tablet (40 mg total) by mouth daily as needed.    Dispense:  90 tablet    Refill:  1   Referral Orders  No referral(s) requested today     Note is dictated utilizing voice recognition software. Although note has been proof read prior to signing, occasional typographical errors still can be missed. If any questions arise, please do not hesitate to call for verification.   electronically signed by:  Felix Pacini, DO  [facility] - [facility]

## 2023-06-08 LAB — CBC WITH DIFFERENTIAL/PLATELET
Absolute Monocytes: 558 cells/uL (ref 200–950)
Basophils Absolute: 41 cells/uL (ref 0–200)
HCT: 41.4 % (ref 35.0–45.0)
MCH: 28.8 pg (ref 27.0–33.0)
RDW: 13.5 % (ref 11.0–15.0)

## 2023-06-08 LAB — COMPREHENSIVE METABOLIC PANEL
Albumin: 4 g/dL (ref 3.6–5.1)
Alkaline phosphatase (APISO): 61 U/L (ref 37–153)
Sodium: 139 mmol/L (ref 135–146)
Total Protein: 7.6 g/dL (ref 6.1–8.1)

## 2023-06-10 LAB — CBC WITH DIFFERENTIAL/PLATELET
Eosinophils Absolute: 252 cells/uL (ref 15–500)
Hemoglobin: 13.6 g/dL (ref 11.7–15.5)
MCHC: 32.9 g/dL (ref 32.0–36.0)
MPV: 10.1 fL (ref 7.5–12.5)
Neutro Abs: 3441 cells/uL (ref 1500–7800)
Platelets: 306 10*3/uL (ref 140–400)
RBC: 4.73 10*6/uL (ref 3.80–5.10)
Total Lymphocyte: 36.9 %
WBC: 6.8 10*3/uL (ref 3.8–10.8)

## 2023-06-10 LAB — COMPREHENSIVE METABOLIC PANEL
AG Ratio: 1.1 (calc) (ref 1.0–2.5)
ALT: 13 U/L (ref 6–29)
AST: 15 U/L (ref 10–35)
BUN/Creatinine Ratio: 18 (calc) (ref 6–22)
BUN: 10 mg/dL (ref 7–25)
CO2: 24 mmol/L (ref 20–32)
Calcium: 10.5 mg/dL — ABNORMAL HIGH (ref 8.6–10.4)
Chloride: 103 mmol/L (ref 98–110)
Creat: 0.56 mg/dL — ABNORMAL LOW (ref 0.60–1.00)
Globulin: 3.6 g/dL (calc) (ref 1.9–3.7)
Glucose, Bld: 93 mg/dL (ref 65–99)
Potassium: 3.5 mmol/L (ref 3.5–5.3)
Total Bilirubin: 0.6 mg/dL (ref 0.2–1.2)

## 2023-06-10 LAB — LIPASE: Lipase: 29 U/L (ref 7–60)

## 2023-06-10 LAB — C-REACTIVE PROTEIN: CRP: <3 mg/L (ref <8.0)

## 2023-06-11 ENCOUNTER — Telehealth: Payer: Self-pay | Admitting: Family Medicine

## 2023-06-11 NOTE — Telephone Encounter (Signed)
LM for pt to return call to discuss.  

## 2023-06-11 NOTE — Telephone Encounter (Signed)
Please inform patient all of her labs are normal which is reassuring.  Take the medications as we prescribed and follow-up with her GI as scheduled in the next few weeks.

## 2023-06-12 NOTE — Telephone Encounter (Signed)
Patient was not seen for bacterial infection, and all of her labs were normal suggesting no inflammation or bacteria was starting at the time of her appointment. Her symptoms last week were more epigastric in nature. If She feels she is having a diverticulitis flare than she would need to add an additional appointment to discuss, have repeat labs and have CT imaging.

## 2023-06-12 NOTE — Telephone Encounter (Signed)
Spoke with patient regarding results/recommendations.  

## 2023-06-12 NOTE — Telephone Encounter (Signed)
Forgot to add she would like the prescription to go to [facility] in [place] if something is called in today.

## 2023-06-12 NOTE — Telephone Encounter (Signed)
Patient is aware of lab results and recommendations. However, she is requesting a call on her cell [phone] to discuss additional medications to treat a possible bacterial infection. She reports this is something she has taken in the past. Please give the patient a call to discuss her medicationr equest.

## 2023-06-21 ENCOUNTER — Ambulatory Visit: Payer: Medicare HMO | Admitting: Physician Assistant

## 2023-06-21 ENCOUNTER — Encounter: Payer: Self-pay | Admitting: Physician Assistant

## 2023-06-21 VITALS — BP 130/70 | HR 76 | Ht 61.0 in | Wt 159.0 lb

## 2023-06-21 DIAGNOSIS — K219 Gastro-esophageal reflux disease without esophagitis: Secondary | ICD-10-CM

## 2023-06-21 DIAGNOSIS — Z8719 Personal history of other diseases of the digestive system: Secondary | ICD-10-CM | POA: Diagnosis not present

## 2023-06-21 DIAGNOSIS — R140 Abdominal distension (gaseous): Secondary | ICD-10-CM

## 2023-06-21 NOTE — Patient Instructions (Addendum)
Continue Protonix 40 mg   Use Zofran 4 mg as needed  With episodes of bloating use Miralax for a few days and IBgard as directed  Follow up as needed  STOP artificial sweeter  _______________________________________________________  If your blood pressure at your visit was 140/90 or greater, please contact your primary care physician to follow up on this.  _______________________________________________________  If you are age 76 or older, your body mass index should be between 23-30. Your Body mass index is 30.04 kg/m. If this is out of the aforementioned range listed, please consider follow up with your Primary Care Provider.  If you are age 28 or younger, your body mass index should be between 19-25. Your Body mass index is 30.04 kg/m. If this is out of the aformentioned range listed, please consider follow up with your Primary Care Provider.   ________________________________________________________  The [facility] providers would like to encourage you to use [facility] to communicate with providers for non-urgent requests or questions.  Due to long hold times on the telephone, sending your provider a message by [facility] may be a faster and more efficient way to get a response.  Please allow 48 business hours for a response.  Please remember that this is for non-urgent requests.  _______________________________________________________   I appreciate the  opportunity to care for you  Thank You   Amy Myrtie Hawk

## 2023-06-21 NOTE — Progress Notes (Signed)
Subjective:    Patient ID: Shelly Leonard, female    DOB: January 26, 1946, 77 y.o.   MRN: [id number]  HPI Shelly Leonard is a pleasant 77 year old female, established with Dr. Chales Abrahams.  She was last seen here in March 2023.  She does have history of constipation, and diverticular disease with recurrent diverticulitis.  Also with history of GERD, hypertension and obesity. Last colonoscopy was done in 2017 with numerous diverticuli throughout the colon.  She had removal of a 3 mm polyp from the cecum which path showed to be benign colonic mucosa.  She comes in today after recent episode with epigastric pain and nausea.  She was able to see her PCP Dr. Claiborne Billings on 06/07/2023.  She had been on low-dose Protonix at 20 mg daily for GERD and this was increased to 40 mg daily.  She was given a prescription for Zofran to use as needed for nausea.  She had not been on any recent new medications, she does have a prescription for meloxicam but she says she only uses that rarely when Tylenol does not work. She relates that it was felt she may have had an infectious etiology for her symptoms. Labs done at that time showed a CRP of less than 3, CBC within normal limits, LFTs normal, lipase normal and H. pylori IgG was negative. She did not have any abdominal imaging.  Says she is feeling better at this point nausea has resolved as has the epigastric pain she is eating without difficulty.  She continues on Protonix at 40 mg/day.  Movements have been regular, no melena or hematochezia. She says the only symptoms she is concerned about presently is intermittent episodes of bloating in the lower abdomen which she says can be uncomfortable at times she says once this started she will have symptoms for a few days with bloating and gas.  She says this feels completely different than when she gets diverticulitis and she would like to have something to take for this on an as-needed basis.  She says her bowels are usually pretty regular  and she is not certain that she feels constipated when these episodes come on. She does not have lactose intolerance, is unaware of any specific food triggers for the bloating and gas.  She does not drink carbonated beverages but does use artificial sweetener in the form of equal.   Review of Systems Pertinent positive and negative review of systems were noted in the above HPI section.  All other review of systems was otherwise negative.   Outpatient Encounter Medications as of 06/21/2023  Medication Sig   aspirin EC 81 MG tablet Take 81 mg by mouth daily.   Cholecalciferol (VITAMIN D PO) Take by mouth.   fluticasone (FLONASE) 50 MCG/ACT nasal spray USE 1 SPRAY(S) IN EACH NOSTRIL TWICE DAILY AS NEEDED FOR  NASAL  CONGESTION   gabapentin (NEURONTIN) 300 MG capsule Take 300 mg by mouth at bedtime.   meloxicam (MOBIC) 15 MG tablet Take 1 tablet (15 mg total) by mouth daily.   olmesartan-hydrochlorothiazide (BENICAR HCT) 20-12.5 MG tablet Take 1 tablet by mouth daily.   Omega-3 Fatty Acids (OMEGA 3 PO) Take 1 capsule by mouth daily.   ondansetron (ZOFRAN-ODT) 4 MG disintegrating tablet Take 1 tablet (4 mg total) by mouth every 8 (eight) hours as needed for nausea or vomiting.   pantoprazole (PROTONIX) 40 MG tablet Take 1 tablet (40 mg total) by mouth daily as needed. (Patient taking differently: Take 40 mg by mouth daily.)  potassium chloride SA (KLOR-CON M) 20 MEQ tablet Take 1 tablet (20 mEq total) by mouth daily. For more refills please call your Primary Care Doctor. No more refills from GI   Probiotic Product (PROBIOTIC DAILY PO) Take by mouth.   sennosides-docusate sodium (SENOKOT-S) 8.6-50 MG tablet Take 1-2 tablets by mouth at bedtime.   tizanidine (ZANAFLEX) 2 MG capsule Take 1 capsule (2 mg total) by mouth 2 (two) times daily as needed for muscle spasms.   traMADol (ULTRAM) 50 MG tablet Take 50 mg by mouth every 6 (six) hours as needed.   verapamil (CALAN-SR) 180 MG CR tablet Take 1 tablet  (180 mg total) by mouth at bedtime.   XIIDRA 5 % SOLN Apply 1 drop to eye 2 (two) times daily.   No facility-administered encounter medications on file as of 06/21/2023.   No Known Allergies Patient Active Problem List   Diagnosis Date Noted   Slow transit constipation 04/25/2023   Generalized headache 04/25/2023   Seasonal and perennial allergic rhinitis 05/01/2022   BMI 30.0-30.9,adult 01/09/2022   History of endocrine disorder 12/29/2020   Chronic rhinitis 09/08/2019   Thyroid nodule 05/21/2018   Hypercalcemia 05/09/2018   Gastroesophageal reflux disease 03/14/2018   Chronic sinusitis 03/08/2017   Anosmia 02/20/2017   Morbid obesity (HCC) 02/20/2017   Osteopenia 05/08/2016   Vitamin D deficiency 10/11/2015   Anemia, iron deficiency 08/23/2015   Essential hypertension, benign 07/28/2015   Back pain with left-sided radiculopathy 05/19/2015   Diverticulitis of colon 05/19/2015   Social History   Socioeconomic History   Marital status: Widowed    Spouse name: Not on file   Number of children: 2   Years of education: Not on file   Highest education level: Not on file  Occupational History   Not on file  Tobacco Use   Smoking status: Never   Smokeless tobacco: Never  Vaping Use   Vaping Use: Never used  Substance and Sexual Activity   Alcohol use: No   Drug use: No   Sexual activity: Yes    Birth control/protection: Post-menopausal  Other Topics Concern   Not on file  Social History Narrative   Shelly Leonard is widowed (12/2016).    She enjoys music and is involved with her church.   Social Determinants of Health   Financial Resource Strain: Low Risk  (09/12/2022)   Overall Financial Resource Strain (CARDIA)    Difficulty of Paying Living Expenses: Not hard at all  Food Insecurity: No Food Insecurity (09/12/2022)   Hunger Vital Sign    Worried About Running Out of Food in the Last Year: Never true    Ran Out of Food in the Last Year: Never true  Transportation  Needs: No Transportation Needs (09/12/2022)   PRAPARE - Administrator, Civil Service (Medical): No    Lack of Transportation (Non-Medical): No  Physical Activity: Sufficiently Active (09/12/2022)   Exercise Vital Sign    Days of Exercise per Week: 5 days    Minutes of Exercise per Session: 60 min  Stress: No Stress Concern Present (09/12/2022)   [employer] of Occupational Health - Occupational Stress Questionnaire    Feeling of Stress : Not at all  Social Connections: Moderately Integrated (09/12/2022)   Social Connection and Isolation Panel [NHANES]    Frequency of Communication with Friends and Family: More than three times a week    Frequency of Social Gatherings with Friends and Family: More than three times a week  Attends Religious Services: More than 4 times per year    Active Member of Clubs or Organizations: Yes    Attends Banker Meetings: More than 4 times per year    Marital Status: Widowed  Intimate Partner Violence: Not At Risk (09/12/2022)   Humiliation, Afraid, Rape, and Kick questionnaire    Fear of Current or Ex-Partner: No    Emotionally Abused: No    Physically Abused: No    Sexually Abused: No    Shelly Leonard's family history includes Breast cancer in her mother; Diabetes in her mother; Heart disease in her father; Lung disease in her father.      Objective:    Vitals:   06/21/23 1327  BP: 130/70  Pulse: 76    Physical Exam Well-developed well-nourished folder AA female in no acute distress.  Height, Weight,159  BMI 30.4  HEENT; nontraumatic normocephalic, EOMI, PE R LA, sclera anicteric. Oropharynx;not done Neck; supple, no JVD Cardiovascular; regular rate and rhythm with S1-S2, no murmur rub or gallop Pulmonary; Clear bilaterally Abdomen; soft, nontender, nondistended, no palpable mass or hepatosplenomegaly, bowel sounds are active Rectal;not done Skin; benign exam, no jaundice rash or appreciable lesions Extremities;  no clubbing cyanosis or edema skin warm and dry Neuro/Psych; alert and oriented x4, grossly nonfocal mood and affect appropriate        Assessment & Plan:   #59  77 year old African-American female with recent episode of epigastric pain and nausea which started 3 to 4 weeks ago.  She was seen by her PCP on 06/07/2023, was given Zofran to use as needed and her Protonix was increased from 20 mg to 40 mg once daily. At this point her symptoms have resolved, she denies any residual epigastric discomfort has been eating well has no complaints of nausea, no changes in bowel habits, no melena or hematochezia.  Etiology not clear she may have had some gastritis, or a mild infectious gastroenteritis. Recent labs reassuring  #2 history of pandiverticulosis, recurrent diverticulitis #3 intermittent episodes of lower abdominal bloating/gas may last for a couple of days.  #4 colon cancer screening-up-to-date with negative colonoscopy 2017 # 5 GERD #6. Hypertension  Plan; continue Protonix 40 mg p.o. every morning Patient knows to call should she have recurrence in the epigastric pain or nausea and at that point would repeat labs and do further imaging.  Discussed meloxicam as increased risk for gastritis or peptic ulcer disease, she currently does not use regularly and again was advised to use Tylenol on a regular basis and meloxicam very sparingly only as needed  For lower abdominal bloating and gas asked that she start MiraLAX 17 g in 8 ounces of water daily for couple of days at onset of the symptoms, and have also given her samples of IBgard to take as directed for 7 to 10 days during 1 of these episodes, this can be done as needed as she does not wish to have to take either of these medications on a regular basis.  She will follow-up with Dr. Chales Abrahams or myself as needed.  Roe Wilner Oswald Hillock PA-C 06/21/2023   Cc: Felix Pacini A, DO

## 2023-06-21 NOTE — Progress Notes (Signed)
Agree with assessment/plan.  Raj Joshuwa Vecchio, MD [facility] [phone]  

## 2023-07-09 ENCOUNTER — Telehealth: Payer: Self-pay | Admitting: Family Medicine

## 2023-07-09 NOTE — Telephone Encounter (Signed)
Left vm for pt to return my call to get more information.

## 2023-07-09 NOTE — Telephone Encounter (Signed)
Patient to called to inquire about the different potassium tablets she takes. She would like to know the difference between the one she is currently taking and one she was previously taking which  is potassium er  20 meq. Please call the patient with this information because she states she has more of the previous kind and does not want them to go to waste. She mentions if she does not answer it is ok to leave this information on her voicemail.

## 2023-07-09 NOTE — Telephone Encounter (Signed)
Pt advised of results. 

## 2023-08-19 ENCOUNTER — Other Ambulatory Visit: Payer: Self-pay

## 2023-08-19 DIAGNOSIS — Z780 Asymptomatic menopausal state: Secondary | ICD-10-CM

## 2023-08-19 DIAGNOSIS — Z1231 Encounter for screening mammogram for malignant neoplasm of breast: Secondary | ICD-10-CM

## 2023-08-29 DIAGNOSIS — H5203 Hypermetropia, bilateral: Secondary | ICD-10-CM | POA: Diagnosis not present

## 2023-08-29 DIAGNOSIS — H16223 Keratoconjunctivitis sicca, not specified as Sjogren's, bilateral: Secondary | ICD-10-CM | POA: Diagnosis not present

## 2023-08-29 DIAGNOSIS — H43813 Vitreous degeneration, bilateral: Secondary | ICD-10-CM | POA: Diagnosis not present

## 2023-08-29 DIAGNOSIS — H2513 Age-related nuclear cataract, bilateral: Secondary | ICD-10-CM | POA: Diagnosis not present

## 2023-08-29 DIAGNOSIS — H52223 Regular astigmatism, bilateral: Secondary | ICD-10-CM | POA: Diagnosis not present

## 2023-08-30 ENCOUNTER — Telehealth (HOSPITAL_BASED_OUTPATIENT_CLINIC_OR_DEPARTMENT_OTHER): Payer: Self-pay

## 2023-09-09 ENCOUNTER — Telehealth (HOSPITAL_BASED_OUTPATIENT_CLINIC_OR_DEPARTMENT_OTHER): Payer: Self-pay

## 2023-09-10 ENCOUNTER — Ambulatory Visit: Payer: Medicare HMO | Admitting: Family Medicine

## 2023-09-17 ENCOUNTER — Ambulatory Visit (INDEPENDENT_AMBULATORY_CARE_PROVIDER_SITE_OTHER): Payer: Medicare HMO | Admitting: Family Medicine

## 2023-09-17 ENCOUNTER — Encounter: Payer: Self-pay | Admitting: Family Medicine

## 2023-09-17 VITALS — BP 150/90 | HR 80 | Temp 97.9°F | Wt 162.4 lb

## 2023-09-17 DIAGNOSIS — E041 Nontoxic single thyroid nodule: Secondary | ICD-10-CM | POA: Diagnosis not present

## 2023-09-17 DIAGNOSIS — E559 Vitamin D deficiency, unspecified: Secondary | ICD-10-CM | POA: Diagnosis not present

## 2023-09-17 DIAGNOSIS — I10 Essential (primary) hypertension: Secondary | ICD-10-CM | POA: Diagnosis not present

## 2023-09-17 DIAGNOSIS — K219 Gastro-esophageal reflux disease without esophagitis: Secondary | ICD-10-CM | POA: Diagnosis not present

## 2023-09-17 DIAGNOSIS — M8589 Other specified disorders of bone density and structure, multiple sites: Secondary | ICD-10-CM | POA: Diagnosis not present

## 2023-09-17 MED ORDER — POTASSIUM CHLORIDE CRYS ER 20 MEQ PO TBCR: 20.0000 meq | EXTENDED_RELEASE_TABLET | Freq: Every day | ORAL | 1 refills | Status: DC

## 2023-09-17 MED ORDER — SENNA-DOCUSATE SODIUM 8.6-50 MG PO TABS: 1.0000 | ORAL_TABLET | Freq: Every day | ORAL | 5 refills | Status: DC

## 2023-09-17 MED ORDER — MELOXICAM 15 MG PO TABS: 15.0000 mg | ORAL_TABLET | Freq: Every day | ORAL | 1 refills | Status: DC

## 2023-09-17 MED ORDER — OLMESARTAN MEDOXOMIL 40 MG PO TABS: 40.0000 mg | ORAL_TABLET | Freq: Every day | ORAL | 1 refills | Status: DC

## 2023-09-17 MED ORDER — PANTOPRAZOLE SODIUM 40 MG PO TBEC: 40.0000 mg | DELAYED_RELEASE_TABLET | Freq: Every day | ORAL | 1 refills | Status: DC | PRN

## 2023-09-17 MED ORDER — TIZANIDINE HCL 2 MG PO CAPS: 2.0000 mg | ORAL_CAPSULE | Freq: Two times a day (BID) | ORAL | 1 refills | Status: DC | PRN

## 2023-09-17 MED ORDER — AMOXICILLIN-POT CLAVULANATE 875-125 MG PO TABS: 1.0000 | ORAL_TABLET | Freq: Two times a day (BID) | ORAL | 0 refills | Status: DC

## 2023-09-17 NOTE — Progress Notes (Signed)
Patient Care Team    Relationship Specialty Notifications Start End  Natalia Leatherwood, DO PCP - General Family Medicine  08/18/15   Sallye Lat, MD Consulting Physician Ophthalmology  05/08/16   Rachael Fee, MD Attending Physician Gastroenterology  05/08/16   Keturah Barre, MD Consulting Physician Otolaryngology  06/25/17   Gaylord Shih, [facility]  Specialist  07/01/18   Marlene Lard, MD Consulting Physician Endocrinology  01/28/20     Chief Complaint  Patient presents with   Hypertension    History of Present Ilness: Shelly Leonard, 77 y.o. , female presents today for [facility]  Essential hypertension, benign/morbid obesity Pt reports compliance with olmesartan-HCTZ 20-12.5, verapamil to 180 CR qhs. Patient denies chest pain, shortness of breath, dizziness or lower extremity edema.  RF: Hypertension, obesity, family history of heart disease   Gastroesophageal reflux disease, esophagitis presence not specified Patient reports compliance with Protonix 20 mg daily.   Medicine is working works well for her   Hypercalcemia/Thyroid nodule She does have multiple nodules within the thyroid.  Her biopsy was negative for any type of malignancy.  SHe has started to increase her water consumption daily. She is following with endocrinology.      Past Medical History:  Diagnosis Date   Allergy    Anemia    low iron (hgb 8)   Arthritis    knees and hands    Carpal tunnel syndrome    Cataract    Diverticulitis large intestine w/o perforation or abscess w/bleeding    GERD (gastroesophageal reflux disease)    Headache(784.0)    occasional    Hypertension    Lumbar spondylosis    Osteopenia    PONV (postoperative nausea and vomiting)    Vertigo    Vitamin D deficiency    All allergies reviewed No Known Allergies Past Surgical History:  Procedure Laterality Date   BREAST REDUCTION SURGERY  1982   CARPAL TUNNEL RELEASE     left    COLONOSCOPY     FOOT SURGERY     KNEE  ARTHROSCOPY     right knee 2012    OTHER SURGICAL HISTORY     spur removed top of left foot    REDUCTION MAMMAPLASTY  1982   TOTAL KNEE ARTHROPLASTY  04/21/2012   Procedure: TOTAL KNEE ARTHROPLASTY;  Surgeon: [name], MD;  Location: [facility];  Service: Orthopedics;  Laterality: Right;   VAGINAL HYSTERECTOMY  1977   Family History  Problem Relation Age of Onset   Breast cancer Mother        late in life   Diabetes Mother        late in life   Heart disease Father    Lung disease Father    Colon cancer Neg Hx    Esophageal cancer Neg Hx    Stomach cancer Neg Hx    Rectal cancer Neg Hx    Social History   Social History Narrative   [name] is widowed (12/2016).    She enjoys music and is involved with her church.    All medications verified Allergies as of 09/17/2023   No Known Allergies      Medication List        Accurate as of September 17, 2023  2:02 PM. If you have any questions, ask your nurse or doctor.          STOP taking these medications    olmesartan-hydrochlorothiazide 20-12.5 MG tablet Commonly known  as: Benicar HCT Stopped by: Felix Pacini   ondansetron 4 MG disintegrating tablet Commonly known as: ZOFRAN-ODT Stopped by: Felix Pacini       TAKE these medications    amoxicillin-clavulanate 875-125 MG tablet Commonly known as: AUGMENTIN Take 1 tablet by mouth 2 (two) times daily. Started by: Felix Pacini   aspirin EC 81 MG tablet Take 81 mg by mouth daily.   fluticasone 50 MCG/ACT nasal spray Commonly known as: FLONASE USE 1 SPRAY(S) IN EACH NOSTRIL TWICE DAILY AS NEEDED FOR  NASAL  CONGESTION   gabapentin 300 MG capsule Commonly known as: NEURONTIN Take 300 mg by mouth at bedtime.   meloxicam 15 MG tablet Commonly known as: MOBIC Take 1 tablet (15 mg total) by mouth daily.   olmesartan 40 MG tablet Commonly known as: BENICAR Take 1 tablet (40 mg total) by mouth daily. Started by: Felix Pacini   OMEGA 3 PO Take 1  capsule by mouth daily.   pantoprazole 40 MG tablet Commonly known as: PROTONIX Take 1 tablet (40 mg total) by mouth daily as needed. What changed:  when to take this reasons to take this   potassium chloride SA 20 MEQ tablet Commonly known as: KLOR-CON M Take 1 tablet (20 mEq total) by mouth daily. For more refills please call your Primary Care Doctor. No more refills from GI   PROBIOTIC DAILY PO Take by mouth.   sennosides-docusate sodium 8.6-50 MG tablet Commonly known as: SENOKOT-S Take 1-2 tablets by mouth at bedtime.   tizanidine 2 MG capsule Commonly known as: Zanaflex Take 1 capsule (2 mg total) by mouth 2 (two) times daily as needed for muscle spasms.   traMADol 50 MG tablet Commonly known as: ULTRAM Take 50 mg by mouth every 6 (six) hours as needed.   verapamil 240 MG CR tablet Commonly known as: CALAN-SR Take 240 mg by mouth at bedtime. What changed: Another medication with the same name was removed. Continue taking this medication, and follow the directions you see here. Changed by: Felix Pacini   VITAMIN D PO Take by mouth.   Xiidra 5 % Soln Generic drug: Lifitegrast Apply 1 drop to eye 2 (two) times daily.           04/23/2023    1:41 PM 09/12/2022   10:06 AM 07/31/2022    2:00 PM 08/30/2021   10:19 AM 02/10/2021    1:07 PM  Depression screen PHQ 2/9  Decreased Interest 0 0 0 0 0  Down, Depressed, Hopeless 1 0 0 0 0  PHQ - 2 Score 1 0 0 0 0       No data to display           Immunizations: Immunization History  Administered Date(s) Administered   PFIZER(Purple Top)SARS-COV-2 Vaccination 03/17/2020, 04/12/2020, 11/30/2020   PNEUMOCOCCAL CONJUGATE-20 12/26/2022   Pfizer Covid-19 Vaccine Bivalent Booster 35yrs & up 10/25/2021   Pneumococcal Conjugate-13 04/01/2014   Pneumococcal Polysaccharide-23 11/29/2015   Td 08/14/2011   Tdap 03/14/2022   Zoster Recombinant(Shingrix) 08/15/2022, 11/14/2022   Zoster, Live 01/20/2013   Objective:   BP (!) 150/90   Pulse 80   Temp 97.9 F (36.6 C)   Wt 162 lb 6.4 oz (73.7 kg)   SpO2 100%   BMI 30.69 kg/m  Physical Exam Vitals and nursing note reviewed.  Constitutional:      General: She is not in acute distress.    Appearance: Normal appearance. She is obese. She is not ill-appearing, toxic-appearing or  diaphoretic.  HENT:     Head: Normocephalic and atraumatic.     Mouth/Throat:     Mouth: Mucous membranes are moist.  Eyes:     General: No scleral icterus.       Right eye: No discharge.        Left eye: No discharge.     Extraocular Movements: Extraocular movements intact.     Conjunctiva/sclera: Conjunctivae normal.     Pupils: Pupils are equal, round, and reactive to light.  Cardiovascular:     Rate and Rhythm: Normal rate and regular rhythm.     Heart sounds: No murmur heard. Pulmonary:     Effort: Pulmonary effort is normal. No respiratory distress.     Breath sounds: Normal breath sounds. No wheezing, rhonchi or rales.  Musculoskeletal:     Right lower leg: No edema.     Left lower leg: No edema.  Skin:    General: Skin is warm and dry.     Coloration: Skin is not jaundiced or pale.     Findings: No erythema or rash.  Neurological:     Mental Status: She is alert and oriented to person, place, and time. Mental status is at baseline.     Motor: No weakness.     Gait: Gait normal.  Psychiatric:        Mood and Affect: Mood normal.        Behavior: Behavior normal.        Thought Content: Thought content normal.        Judgment: Judgment normal.     Assessment and Plan Shelly Leonard is a 77 y.o. female present for [facility]  Essential hypertension, benign Stable DC olmesartan-HCTZ 20-12.5 Start olmesartan 40 mg qd Continue verapamil to 240 CR qhs (may increase next visit if needed) -Low-sodium diet, exercise encouraged.  Gastroesophageal reflux disease, esophagitis presence not specified/long term use of ppi Stable Continue Protonix. Vitd, b12 and  mag UTD   Hypercalcemia/Thyroid nodule - established with endocrine. Continue routine follow-ups. -Biopsy was benign.   -Continue to work on adequate hydration.  Chronic back pain: Stable Continue Zanaflex 2 mg twice daily as needed. Encouraged her to take before bed.  Patient reports she rarely needs. Continue mobic qd as needed  Return in about 24 weeks (around 03/03/2024) for Routine chronic condition follow-up.   Meds ordered this encounter  Medications   meloxicam (MOBIC) 15 MG tablet    Sig: Take 1 tablet (15 mg total) by mouth daily.    Dispense:  90 tablet    Refill:  1   pantoprazole (PROTONIX) 40 MG tablet    Sig: Take 1 tablet (40 mg total) by mouth daily as needed.    Dispense:  90 tablet    Refill:  1   potassium chloride SA (KLOR-CON M) 20 MEQ tablet    Sig: Take 1 tablet (20 mEq total) by mouth daily. For more refills please call your Primary Care Doctor. No more refills from GI    Dispense:  90 tablet    Refill:  1   sennosides-docusate sodium (SENOKOT-S) 8.6-50 MG tablet    Sig: Take 1-2 tablets by mouth at bedtime.    Dispense:  60 tablet    Refill:  5   tizanidine (ZANAFLEX) 2 MG capsule    Sig: Take 1 capsule (2 mg total) by mouth 2 (two) times daily as needed for muscle spasms.    Dispense:  180 capsule    Refill:  1   amoxicillin-clavulanate (AUGMENTIN) 875-125 MG tablet    Sig: Take 1 tablet by mouth 2 (two) times daily.    Dispense:  20 tablet    Refill:  0   olmesartan (BENICAR) 40 MG tablet    Sig: Take 1 tablet (40 mg total) by mouth daily.    Dispense:  90 tablet    Refill:  1   No orders of the defined types were placed in this encounter.   Electronically Signed by: Felix Pacini, DO [facility]

## 2023-09-17 NOTE — Patient Instructions (Addendum)
Return in about 24 weeks (around 03/03/2024) for Routine chronic condition follow-up.   Stop olmesartan combination pill and start olmesartan 40 mg tab  Nurse visit 2 weeks for BP recheck     Great to see you today.  I have refilled the medication(s) we provide.   If labs were collected or images ordered, we will inform you of  results once we have received them and reviewed. We will contact you either by echart message, or telephone call.  Please give ample time to the testing facility, and our office to run,  receive and review results. Please do not call inquiring of results, even if you can see them in your chart. We will contact you as soon as we are able. If it has been over 1 week since the test was completed, and you have not yet heard from [place], then please call us.    - echart message- for normal results that have been seen by the patient already.   - telephone call: abnormal results or if patient has not viewed results in their echart.  If a referral to a specialist was entered for you, please call us in 2 weeks if you have not heard from the specialist office to schedule.

## 2023-09-19 ENCOUNTER — Other Ambulatory Visit (HOSPITAL_BASED_OUTPATIENT_CLINIC_OR_DEPARTMENT_OTHER): Payer: Medicare HMO

## 2023-09-19 ENCOUNTER — Ambulatory Visit (HOSPITAL_BASED_OUTPATIENT_CLINIC_OR_DEPARTMENT_OTHER): Payer: Medicare HMO

## 2023-10-01 ENCOUNTER — Ambulatory Visit (HOSPITAL_BASED_OUTPATIENT_CLINIC_OR_DEPARTMENT_OTHER)
Admission: RE | Admit: 2023-10-01 | Discharge: 2023-10-01 | Disposition: A | Discharge status: Home / Self Care | Payer: Medicare HMO | Source: Ambulatory Visit | Attending: Family Medicine | Admitting: Family Medicine

## 2023-10-01 ENCOUNTER — Encounter (HOSPITAL_BASED_OUTPATIENT_CLINIC_OR_DEPARTMENT_OTHER): Payer: Self-pay

## 2023-10-01 DIAGNOSIS — Z780 Asymptomatic menopausal state: Secondary | ICD-10-CM | POA: Insufficient documentation

## 2023-10-01 DIAGNOSIS — Z1231 Encounter for screening mammogram for malignant neoplasm of breast: Secondary | ICD-10-CM | POA: Insufficient documentation

## 2023-10-01 DIAGNOSIS — Z13820 Encounter for screening for osteoporosis: Secondary | ICD-10-CM | POA: Diagnosis not present

## 2023-10-02 ENCOUNTER — Telehealth: Payer: Self-pay | Admitting: Family Medicine

## 2023-10-02 ENCOUNTER — Ambulatory Visit: Payer: Medicare HMO

## 2023-10-02 VITALS — BP 158/86 | HR 77 | Temp 98.1°F | Wt 162.6 lb

## 2023-10-02 DIAGNOSIS — Z0000 Encounter for general adult medical examination without abnormal findings: Secondary | ICD-10-CM

## 2023-10-02 DIAGNOSIS — M810 Age-related osteoporosis without current pathological fracture: Secondary | ICD-10-CM

## 2023-10-02 NOTE — Progress Notes (Signed)
Reviewed CMA note concerning blood pressure. Please schedule patient to get in 2 weeks for blood pressure recheck and both of her blood pressure medications have to be in her system, including morning medication needs to be in her system for at least 2 hours.

## 2023-10-02 NOTE — Patient Instructions (Signed)

## 2023-10-02 NOTE — Progress Notes (Signed)
Subjective:   Shelly Leonard is a 77 y.o. female who presents for Medicare Annual (Subsequent) preventive examination.  Visit Complete: In person  Patient Medicare AWV questionnaire was completed by the patient on 10/02/23; I have confirmed that all information answered by patient is correct and no changes since this date.  Cardiac Risk Factors include: advanced age (>103men, >80 women);hypertension;obesity (BMI >30kg/m2)     Objective:    Today's Vitals   10/02/23 1133 10/02/23 1140  BP: (!) 165/88 (!) 158/86  Pulse: 77   Temp: 98.1 F (36.7 C)   SpO2: 100%   Weight: 162 lb 9.6 oz (73.8 kg)    Body mass index is 30.72 kg/m.     10/02/2023    3:47 PM 09/12/2022   10:07 AM 08/30/2021   10:20 AM 08/09/2020    1:00 PM 08/04/2019    2:08 PM 07/01/2018   11:18 AM 06/25/2017   11:10 AM  Advanced Directives  Does Patient Have a Medical Advance Directive? No No No No No No No  Would patient like information on creating a medical advance directive? No - Patient declined No - Patient declined No - Patient declined No - Patient declined No - Patient declined Yes (MAU/Ambulatory/Procedural Areas - Information given) Yes (MAU/Ambulatory/Procedural Areas - Information given)    Current Medications (verified) Outpatient Encounter Medications as of 10/02/2023  Medication Sig   amoxicillin-clavulanate (AUGMENTIN) 875-125 MG tablet Take 1 tablet by mouth 2 (two) times daily.   aspirin EC 81 MG tablet Take 81 mg by mouth daily.   Cholecalciferol (VITAMIN D PO) Take by mouth.   fluticasone (FLONASE) 50 MCG/ACT nasal spray USE 1 SPRAY(S) IN EACH NOSTRIL TWICE DAILY AS NEEDED FOR  NASAL  CONGESTION   gabapentin (NEURONTIN) 300 MG capsule Take 300 mg by mouth at bedtime.   meloxicam (MOBIC) 15 MG tablet Take 1 tablet (15 mg total) by mouth daily.   olmesartan (BENICAR) 40 MG tablet Take 1 tablet (40 mg total) by mouth daily.   Omega-3 Fatty Acids (OMEGA 3 PO) Take 1 capsule by mouth daily.    pantoprazole (PROTONIX) 40 MG tablet Take 1 tablet (40 mg total) by mouth daily as needed.   potassium chloride SA (KLOR-CON M) 20 MEQ tablet Take 1 tablet (20 mEq total) by mouth daily. For more refills please call your Primary Care Doctor. No more refills from GI   Probiotic Product (PROBIOTIC DAILY PO) Take by mouth.   sennosides-docusate sodium (SENOKOT-S) 8.6-50 MG tablet Take 1-2 tablets by mouth at bedtime.   tizanidine (ZANAFLEX) 2 MG capsule Take 1 capsule (2 mg total) by mouth 2 (two) times daily as needed for muscle spasms.   traMADol (ULTRAM) 50 MG tablet Take 50 mg by mouth every 6 (six) hours as needed.   verapamil (CALAN-SR) 240 MG CR tablet Take 240 mg by mouth at bedtime.   XIIDRA 5 % SOLN Apply 1 drop to eye 2 (two) times daily.   No facility-administered encounter medications on file as of 10/02/2023.    Allergies (verified) Patient has no known allergies.   History: Past Medical History:  Diagnosis Date   Allergy    Anemia    low iron (hgb 8)   Arthritis    knees and hands    Carpal tunnel syndrome    Cataract    Diverticulitis large intestine w/o perforation or abscess w/bleeding    GERD (gastroesophageal reflux disease)    Headache(784.0)    occasional  Hypertension    Lumbar spondylosis    Osteopenia    PONV (postoperative nausea and vomiting)    Vertigo    Vitamin D deficiency    Past Surgical History:  Procedure Laterality Date   BREAST REDUCTION SURGERY  1982   CARPAL TUNNEL RELEASE     left    COLONOSCOPY     FOOT SURGERY     KNEE ARTHROSCOPY     right knee 2012    OTHER SURGICAL HISTORY     spur removed top of left foot    REDUCTION MAMMAPLASTY  1982   TOTAL KNEE ARTHROPLASTY  04/21/2012   Procedure: TOTAL KNEE ARTHROPLASTY;  Surgeon: Shelly Pal, MD;  Location: [facility];  Service: Orthopedics;  Laterality: Right;   VAGINAL HYSTERECTOMY  1977   Family History  Problem Relation Age of Onset   Breast cancer Mother        late in life    Diabetes Mother        late in life   Heart disease Father    Lung disease Father    Colon cancer Neg Hx    Esophageal cancer Neg Hx    Stomach cancer Neg Hx    Rectal cancer Neg Hx    Social History   Socioeconomic History   Marital status: Widowed    Spouse name: Not on file   Number of children: 2   Years of education: Not on file   Highest education level: Not on file  Occupational History   Not on file  Tobacco Use   Smoking status: Never   Smokeless tobacco: Never  Vaping Use   Vaping status: Never Used  Substance and Sexual Activity   Alcohol use: No   Drug use: No   Sexual activity: Yes    Birth control/protection: Post-menopausal  Other Topics Concern   Not on file  Social History Narrative   Shelly Leonard is widowed (12/2016).    She enjoys music and is involved with her church.   Social Determinants of Health   Financial Resource Strain: Low Risk  (10/02/2023)   Overall Financial Resource Strain (CARDIA)    Difficulty of Paying Living Expenses: Not hard at all  Food Insecurity: No Food Insecurity (10/02/2023)   Hunger Vital Sign    Worried About Running Out of Food in the Last Year: Never true    Ran Out of Food in the Last Year: Never true  Transportation Needs: No Transportation Needs (10/02/2023)   PRAPARE - Administrator, Civil Service (Medical): No    Lack of Transportation (Non-Medical): No  Physical Activity: Sufficiently Active (10/02/2023)   Exercise Vital Sign    Days of Exercise per Week: 6 days    Minutes of Exercise per Session: 30 min  Stress: No Stress Concern Present (10/02/2023)   [employer] of Occupational Health - Occupational Stress Questionnaire    Feeling of Stress : Not at all  Social Connections: Moderately Integrated (10/02/2023)   Social Connection and Isolation Panel [NHANES]    Frequency of Communication with Friends and Family: More than three times a week    Frequency of Social Gatherings with Friends and  Family: More than three times a week    Attends Religious Services: More than 4 times per year    Active Member of [employer] or Organizations: Yes    Attends Banker Meetings: More than 4 times per year    Marital Status: Widowed  Tobacco Counseling Counseling given: Not Answered   Clinical Intake:  Pre-visit preparation completed: No  Pain : No/denies pain     Nutritional Risks: None Diabetes: No  How often do you need to have someone help you when you read instructions, pamphlets, or other written materials from your doctor or pharmacy?: 1 - Never  Interpreter Needed?: No      Activities of Daily Living    10/02/2023    3:45 PM  In your present state of health, do you have any difficulty performing the following activities:  Hearing? 0  Vision? 1  Difficulty concentrating or making decisions? 0  Walking or climbing stairs? 0  Dressing or bathing? 0  Doing errands, shopping? 0  Preparing Food and eating ? N  Using the Toilet? N  In the past six months, have you accidently leaked urine? N  Do you have problems with loss of bowel control? N  Managing your Medications? N  Managing your Finances? N  Housekeeping or managing your Housekeeping? N    Patient Care Team: Natalia Leatherwood, DO as PCP - General (Family Medicine) Sallye Lat, MD as Consulting Physician (Ophthalmology) Rachael Fee, MD as Attending Physician (Gastroenterology) Keturah Barre, MD as Consulting Physician (Otolaryngology) Gaylord Shih, [facility] (Specialist) Marlene Lard, MD as Consulting Physician (Endocrinology)  Indicate any recent Medical Services you may have received from other than [facility] providers in the past year (date may be approximate).     Assessment:   This is a routine wellness examination for [place].  Hearing/Vision screen No results found.   Goals Addressed   None    Depression Screen    10/02/2023    3:46 PM 04/23/2023    1:41 PM 09/12/2022    10:06 AM 07/31/2022    2:00 PM 08/30/2021   10:19 AM 02/10/2021    1:07 PM 08/09/2020    1:03 PM  PHQ 2/9 Scores  PHQ - 2 Score 0 1 0 0 0 0 1    Fall Risk    10/02/2023    3:46 PM 04/23/2023    1:41 PM 09/12/2022   10:08 AM 07/31/2022    2:01 PM 02/10/2021    1:07 PM  Fall Risk   Falls in the past year? 0 1 0 0 0  Number falls in past yr: 0 0 0 0 0  Injury with Fall? 0 0 0 0 0  Risk for fall due to : No Fall Risks  No Fall Risks;Impaired vision;Impaired balance/gait No Fall Risks   Risk for fall due to: Comment   at times    Follow up Falls evaluation completed Falls evaluation completed Falls prevention discussed Falls evaluation completed     MEDICARE RISK AT HOME: Medicare Risk at Home Any stairs in or around the home?: Yes If so, are there any without handrails?: Yes Home free of loose throw rugs in walkways, pet beds, electrical cords, etc?: Yes Adequate lighting in your home to reduce risk of falls?: Yes Life alert?: No Use of a cane, walker or w/c?: Yes Grab bars in the bathroom?: No Shower chair or bench in shower?: Yes Elevated toilet seat or a handicapped toilet?: No  TIMED UP AND GO:  Was the test performed?  Yes  Length of time to ambulate 10 feet: 7 sec Gait steady and fast with assistive device    Cognitive Function:    08/04/2019    2:10 PM 07/01/2018   11:20 AM  MMSE - Mini Mental State  Exam  Orientation to time 5 5  Orientation to Place 5 5  Registration 3 3  Attention/ Calculation 5 5  Recall 3 3  Language- name 2 objects 2 2  Language- repeat 1 1  Language- follow 3 step command 3 3  Language- read & follow direction 1 1  Write a sentence 1 1  Copy design 1 1  Total score 30 30        10/02/2023    3:49 PM 09/12/2022   10:10 AM  6CIT Screen  What Year? 0 points 0 points  What month? 0 points 0 points  What time? 0 points 0 points  Count back from 20 0 points 0 points  Months in reverse 0 points 0 points  Repeat phrase 0 points 0 points   Total Score 0 points 0 points    Immunizations Immunization History  Administered Date(s) Administered   PFIZER(Purple Top)SARS-COV-2 Vaccination 03/17/2020, 04/12/2020, 11/30/2020   PNEUMOCOCCAL CONJUGATE-20 12/26/2022   Pfizer Covid-19 Vaccine Bivalent Booster 74yrs & up 10/25/2021   Pneumococcal Conjugate-13 04/01/2014   Pneumococcal Polysaccharide-23 11/29/2015   Td 08/14/2011   Tdap 03/14/2022   Zoster Recombinant(Shingrix) 08/15/2022, 11/14/2022   Zoster, Live 01/20/2013    TDAP status: Up to date  Flu Vaccine status: Declined, Education has been provided regarding the importance of this vaccine but patient still declined. Advised may receive this vaccine at local pharmacy or Health Dept. Aware to provide a copy of the vaccination record if obtained from local pharmacy or Health Dept. Verbalized acceptance and understanding.  Pneumococcal vaccine status: Up to date  Covid-19 vaccine status: Completed vaccines  Qualifies for Shingles Vaccine? Yes   Zostavax completed No   Shingrix Completed?: Yes  Screening Tests Health Maintenance  Topic Date Due   MAMMOGRAM  09/20/2023   INFLUENZA VACCINE  03/23/2024 (Originally 07/25/2023)   Medicare Annual Wellness (AWV)  10/01/2024   Colonoscopy  05/29/2026   DEXA SCAN  09/30/2026   DTaP/Tdap/Td (3 - Td or Tdap) 03/14/2032   Pneumonia Vaccine 63+ Years old  Completed   Hepatitis C Screening  Completed   Zoster Vaccines- Shingrix  Completed   HPV VACCINES  Aged Out   COVID-19 Vaccine  Discontinued    Health Maintenance  Health Maintenance Due  Topic Date Due   MAMMOGRAM  09/20/2023    Colorectal cancer screening: No longer required.   Mammogram status: No longer required due to age.  Bone Density status: Completed 10/01/23. Results reflect: Bone density results: OSTEOPOROSIS. Repeat every 2 years.  Lung Cancer Screening: (Low Dose CT Chest recommended if Age 22-80 years, 20 pack-year currently smoking OR have quit  w/in 15years.) does not qualify.   Lung Cancer Screening Referral: n/a  Additional Screening:  Hepatitis C Screening: does qualify; Completed 10/11/2015  Vision Screening: Recommended annual ophthalmology exams for early detection of glaucoma and other disorders of the eye. Is the patient up to date with their annual eye exam?  Yes  Who is the provider or what is the name of the office in which the patient attends annual eye exams? [facility] If pt is not established with a provider, would they like to be referred to a provider to establish care? No .   Dental Screening: Recommended annual dental exams for proper oral hygiene  Diabetic Foot Exam: n/a  Community Resource Referral / Chronic Care Management: CRR required this visit?  No   CCM required this visit?  No     Plan:  I have personally reviewed and noted the following in the patient's chart:   Medical and social history Use of alcohol, tobacco or illicit drugs  Current medications and supplements including opioid prescriptions. Patient is not currently taking opioid prescriptions. Functional ability and status Nutritional status Physical activity Advanced directives List of other physicians Hospitalizations, surgeries, and ER visits in previous 12 months Vitals Screenings to include cognitive, depression, and falls Referrals and appointments  In addition, I have reviewed and discussed with patient certain preventive protocols, quality metrics, and best practice recommendations. A written personalized care plan for preventive services as well as general preventive health recommendations were provided to patient.     Filomena Jungling, CMA   10/02/2023   After Visit Summary: (In Person-Declined) Patient declined AVS at this time.  Nurse Notes: Non-Face to Face or Face to Face 15 minute visit Encounter   Shelly. Scriven , Thank you for taking time to come for your Medicare Wellness Visit. I appreciate your  ongoing commitment to your health goals. Please review the following plan we discussed and let me know if I can assist you in the future.   These are the goals we discussed:  Goals      Patient Stated     Maintain current health.      Patient Stated     Would like to loose weight      Patient Stated     Maintain health      Weight (lb) < 160 lb (72.6 kg)     Lose weight by increasing activity and eating healthier.     Weight (lb) < 170 lb (77.1 kg)     Lose weight by continuing to stay active.         This is a list of the screening recommended for you and due dates:  Health Maintenance  Topic Date Due   Mammogram  09/20/2023   Flu Shot  03/23/2024*   Medicare Annual Wellness Visit  10/01/2024   Colon Cancer Screening  05/29/2026   DEXA scan (bone density measurement)  09/30/2026   DTaP/Tdap/Td vaccine (3 - Td or Tdap) 03/14/2032   Pneumonia Vaccine  Completed   Hepatitis C Screening  Completed   Zoster (Shingles) Vaccine  Completed   HPV Vaccine  Aged Out   COVID-19 Vaccine  Discontinued  *Topic was postponed. The date shown is not the original due date.

## 2023-10-02 NOTE — Telephone Encounter (Signed)
Please inform patient her bone density resulted with decreased bone density very mildly in her hip.  Overall was really good for age at these locations. Her forearms did show a decreased density in the osteoporosis range.  She had been on Fosamax in the past but discontinued.  Has a history of hypercalcemia and therefore cannot take added calcium.   If she is interested in Prolia injections every 6 months we can see if she can get approved,.  She may or she may not get approved, depending upon her insurance requirements, but we can try if she would like.

## 2023-10-02 NOTE — Progress Notes (Signed)
Shelly Leonard is a 77 y.o. female presents to the office today for Blood pressure recheck secondary to regimen change [Ex: elevated BP in office, med start, regimen change].  Blood pressure medication: verapamil (CALAN-SR) 240 MG CR tablet ; olmesartan (BENICAR) 40 MG tablet (med, dose, frequency) If on medication, Last dose was at least 1-2 hours prior to recheck: No Blood pressure was taken in the left arm after patient rested for 10 minutes.  BP (!) 158/86   Pulse 77   [name]   Pt states she took evening medication but has not taken the morning medication yet.

## 2023-10-03 ENCOUNTER — Telehealth: Payer: Self-pay

## 2023-10-03 NOTE — Progress Notes (Signed)
LM for pt to return call to discuss.  

## 2023-10-03 NOTE — Telephone Encounter (Signed)
Left pt a VM to return my call  

## 2023-10-03 NOTE — Telephone Encounter (Signed)
Dexa results given. She is willing to try prolia but would like to know what her out of pocket cost would be before starting.

## 2023-10-07 NOTE — Telephone Encounter (Signed)
SOB started with [employer]

## 2023-10-07 NOTE — Telephone Encounter (Signed)
Shelly Leonard please see if she qualifies and let her know if there is an out of pocket cost. Please inform pt we need to see if she meets criteria and this may take additional time.

## 2023-10-09 ENCOUNTER — Telehealth: Payer: Self-pay

## 2023-10-09 ENCOUNTER — Other Ambulatory Visit: Payer: Self-pay | Admitting: Family Medicine

## 2023-10-09 NOTE — Telephone Encounter (Signed)
Prescription Request  10/09/2023  LOV: Visit date not found  What is the name of the medication or equipment? verapamil (CALAN-SR) 240 MG CR tablet   Patient states Dr. Alan Ripper name is on bottle of meds filled in July.  Have you contacted your pharmacy to request a refill? No   Which pharmacy would you like this sent to?  [facility] [address] [place], [place] - [address] [place] [address] Phone: [phone] Fax: [phone]  [facility] [place], [place] - [address] [facility] [address] [facility] [place] [address] [place] [address] Phone: [phone] Fax: [phone]  [facility] [phone] - [place], [place] - [address] [place] [address] Phone: [phone] Fax: [phone]    Patient notified that their request is being sent to the clinical staff for review and that they should receive a response within 2 business days.   Please advise at Mobile [phone] (mobile)

## 2023-10-10 MED ORDER — VERAPAMIL HCL ER 240 MG PO TBCR: 240.0000 mg | EXTENDED_RELEASE_TABLET | Freq: Every day | ORAL | 0 refills | Status: DC

## 2023-10-10 NOTE — Telephone Encounter (Signed)
Please complete PA for prolia injection thru insurance. Injection is given in office as buy & bill

## 2023-10-10 NOTE — Telephone Encounter (Signed)
Rx sent for 30 d/s. Pt is coming in for BP check in the next 2 weeks

## 2023-10-16 ENCOUNTER — Telehealth: Payer: Self-pay

## 2023-10-16 DIAGNOSIS — M810 Age-related osteoporosis without current pathological fracture: Secondary | ICD-10-CM

## 2023-10-16 NOTE — Telephone Encounter (Signed)
noted 

## 2023-10-16 NOTE — Telephone Encounter (Signed)
Prolia VOB initiated via [email or web address] 

## 2023-10-16 NOTE — Telephone Encounter (Signed)
Created new encounter for Prolia BIV. Will route encounter back once benefit verification is complete.  

## 2023-10-17 NOTE — Telephone Encounter (Signed)
Pharmacy Patient Advocate Encounter   Received notification from  [facility] Portal that prior authorization for PROLIA is required/requested.   Insurance verification completed.   The patient is insured through [employer] .   Per test claim: PA required; PA submitted to [employer] via Availity Key/confirmation #/EOC [id number] Status is pending

## 2023-10-18 ENCOUNTER — Other Ambulatory Visit (HOSPITAL_COMMUNITY): Payer: Self-pay

## 2023-10-18 NOTE — Telephone Encounter (Signed)
Pt ready for scheduling for PROLIA on or after : 10/18/23  Out-of-pocket cost due at time of visit: $345  Primary: [employer] MEDICARE Prolia co-insurance: 20% Admin fee co-insurance: 20%  Secondary: --- Prolia co-insurance:  Admin fee co-insurance:   Medical Benefit Details: Date Benefits were checked: 10/16/23 Deductible: NO/ Coinsurance: 20%/ Admin Fee: 20%  Prior Auth: APPROVED PA# [id number] Expiration Date: 10/17/23-10/16/24  # of doses approved:2  Pharmacy benefit: Copay $0 If patient wants fill through the pharmacy benefit please send prescription to: [employer], and include estimated need by date in rx notes. Pharmacy will ship medication directly to the office.  Patient NOT eligible for Prolia Copay Card. Copay Card can make patient's cost as little as $25. Link to apply: [email or web address]://www.amgensupportplus.com/copay  ** This summary of benefits is an estimation of the patient's out-of-pocket cost. Exact cost may very based on individual plan coverage.

## 2023-10-21 ENCOUNTER — Ambulatory Visit (INDEPENDENT_AMBULATORY_CARE_PROVIDER_SITE_OTHER): Payer: Medicare HMO

## 2023-10-21 VITALS — BP 180/102 | HR 63

## 2023-10-21 DIAGNOSIS — I10 Essential (primary) hypertension: Secondary | ICD-10-CM | POA: Diagnosis not present

## 2023-10-21 NOTE — Telephone Encounter (Signed)
Please get clarification from pharmacists. I am not certain I fully understand pt options. If needing to order from pharmacy/insurance and have delivered here, how dose that even work?

## 2023-10-21 NOTE — Progress Notes (Signed)
Pt is prescribed omelsartan/benicar 40 mg and verapamil 240. First, please confirm with patient she is still taking the verapamil before bed and the Benicar 40 mg in the day.   Nurse note from today is not clear on medication regimen  Blood pressure result today is awfully high and we increased both of her blood pressure pills last visit, yet her blood pressure was lower last visit then with the increased doses of medicine.  Once I receive this information we will call her back with further plan.

## 2023-10-21 NOTE — Addendum Note (Signed)
Addended by: Felix Pacini A on: 10/21/2023 03:13 PM   Modules accepted: Orders, Level of Service

## 2023-10-21 NOTE — Progress Notes (Signed)
Shelly Leonard is a 77 y.o. female presents to the office today for Blood pressure recheck secondary to Essential hypertension, benign. Blood pressure medication: olmesartan (BENICAR) 40 MG tablet and olmesartan (BENICAR) 40 MG tablet, once daily.   If on medication, Last dose was at least 1-2 hours prior to recheck: Yes pt confirmed medication was taken around 9:30 this morning.  Blood pressure was taken in the left arm after patient rested for 5-10 minutes. (170/100) heart rate 63   #2 BP (!) 180/102   Pulse 63   Corliss Coggeshall D Joshva Labreck

## 2023-10-22 ENCOUNTER — Telehealth: Payer: Self-pay

## 2023-10-22 NOTE — Progress Notes (Signed)
Pt states she is taking meds as prescribed. She takes omelsartan in the morning with her Potassium and verapamil before bed

## 2023-10-22 NOTE — Telephone Encounter (Signed)
LVM for pt to call and discuss the following:  Kuneff, Renee A, DO at 10/21/2023  1:15 PM  Status: Signed  Pt is prescribed omelsartan/benicar 40 mg and verapamil 240. First, please confirm with patient she is still taking the verapamil before bed and the Benicar 40 mg in the day.   Nurse note from today is not clear on medication regimen   Blood pressure result today is awfully high and we increased both of her blood pressure pills last visit, yet her blood pressure was lower last visit then with the increased doses of medicine.   Once I receive this information we will call her back with further plan.

## 2023-10-23 MED ORDER — DENOSUMAB 60 MG/ML ~~LOC~~ SOSY
60.0000 mg | PREFILLED_SYRINGE | Freq: Once | SUBCUTANEOUS | 0 refills | Status: AC
Start: 2023-10-23 — End: 2023-10-23

## 2023-10-23 NOTE — Telephone Encounter (Signed)
LM for pt to return call to discuss.  

## 2023-10-23 NOTE — Telephone Encounter (Signed)
Please call patient, explained the process to the pharmacist as laid out for [place] and see if she is agreeable to the plan.  If she is agreeable to that plan, then we can start moving forward with ordering the Prolia to one of the pharmacy options the pharmacist has mentioned.   Again, this will be up to the patient on what she is most comfortable with moving forward. Please advise of patient's decision and if desiring Prolia, pharmacy location.

## 2023-10-23 NOTE — Addendum Note (Signed)
Addended by: Filomena Jungling on: 10/23/2023 02:04 PM   Modules accepted: Orders

## 2023-10-24 MED ORDER — SPIRONOLACTONE 25 MG PO TABS
25.0000 mg | ORAL_TABLET | Freq: Every day | ORAL | 1 refills | Status: DC
Start: 2023-10-24 — End: 2023-12-13

## 2023-10-24 NOTE — Telephone Encounter (Signed)
LM for pt to return call regarding provider recommendations.

## 2023-10-24 NOTE — Telephone Encounter (Signed)
Please call patient: Continue Benicar 40 mg in the morning Continue verapamil 240 before bed Start spironolactone in the morning with her Benicar. STOP the  potassium supplement (kdur)   Please ask her to follow-up with PCP in 3-4 weeks for recheck.  We will also have to check her kidney function and potassium by lab at that appointment.  She asked if protein has to do with her blood pressure increasing?  I am not certain if I fully understand her question, but protein would not increase her blood pressure.  Sodium, which is salt, will increase blood pressure.  She asks why to stop the potassium supplement, please explain to her that the Benicar and the spironolactone both help increase potassium, therefore she will not need a potassium supplement any longer and it could make her potassium be too high if she continue taking potassium supplement.

## 2023-10-24 NOTE — Addendum Note (Signed)
Addended by: Felix Pacini A on: 10/24/2023 12:21 PM   Modules accepted: Orders

## 2023-10-24 NOTE — Telephone Encounter (Signed)
Spoke with pt, she takes Verapamil at midnight and Benicar 40mg  in the day at or around 9a. She would also like to confirm if eating protein has to do with your blood pressure.

## 2023-10-24 NOTE — Telephone Encounter (Signed)
Patient returning Vanessa's call from yesterday. Please call patient when someone is available.

## 2023-10-25 ENCOUNTER — Other Ambulatory Visit: Payer: Self-pay | Admitting: Family

## 2023-10-25 NOTE — Telephone Encounter (Signed)
Please follow up with pt

## 2023-10-25 NOTE — Telephone Encounter (Signed)
Patient would like to speak with Erie Noe. I told patient she is out of office today. She said "that is completely fine, she can me on Monday".  If possible, please have Erie Noe call patient on Monday.

## 2023-10-28 NOTE — Telephone Encounter (Signed)
LM for pt to return call to discuss.  

## 2023-10-29 ENCOUNTER — Ambulatory Visit (INDEPENDENT_AMBULATORY_CARE_PROVIDER_SITE_OTHER): Payer: Medicare HMO

## 2023-10-29 DIAGNOSIS — M810 Age-related osteoporosis without current pathological fracture: Secondary | ICD-10-CM

## 2023-10-29 MED ORDER — DENOSUMAB 60 MG/ML ~~LOC~~ SOSY
60.0000 mg | PREFILLED_SYRINGE | Freq: Once | SUBCUTANEOUS | Status: AC
  Administered 2023-10-29: 60 mg via SUBCUTANEOUS

## 2023-10-29 NOTE — Telephone Encounter (Signed)
Spoke with patient regarding results/recommendations.  

## 2023-10-29 NOTE — Progress Notes (Signed)
Pt came for prolia injection. Pt provided medication at appt. Pt tolerated administer medication.

## 2023-10-29 NOTE — Telephone Encounter (Signed)
Please give the patient a call back to provide clarity on which medications she is stopping and continue to take.

## 2023-11-13 ENCOUNTER — Telehealth: Payer: Self-pay | Admitting: Family Medicine

## 2023-11-13 MED ORDER — VERAPAMIL HCL ER 240 MG PO TBCR: 240.0000 mg | EXTENDED_RELEASE_TABLET | Freq: Every day | ORAL | 1 refills | Status: DC

## 2023-11-13 NOTE — Telephone Encounter (Signed)
Refill sent.

## 2023-11-13 NOTE — Telephone Encounter (Signed)
Prescription Request  11/13/2023  LOV: 09/17/2023 Pharmacy is [facility] on [address]  What is the name of the medication or equipment? verapamil (CALAN-SR) 240 MG CR tablet   Have you contacted your pharmacy to request a refill? Yes   Which pharmacy would you like this sent to?  [facility] [address] [place], [place] - [address] [place] [address] Phone: [phone] Fax: [phone]  [facility] - [place], [place] - [address] [facility] [address] [facility] [address] [place] [address] Phone: [phone] Fax: [phone]  [facility] [phone] - [place], [place] - [address] [place] [address] Phone: [phone] Fax: [phone]    Patient notified that their request is being sent to the clinical staff for review and that they should receive a response within 2 business days.   Please advise at [facility] [phone]

## 2023-12-13 ENCOUNTER — Ambulatory Visit (INDEPENDENT_AMBULATORY_CARE_PROVIDER_SITE_OTHER): Payer: Medicare HMO | Admitting: Family Medicine

## 2023-12-13 ENCOUNTER — Encounter: Payer: Self-pay | Admitting: Family Medicine

## 2023-12-13 VITALS — BP 140/80 | HR 75 | Wt 160.0 lb

## 2023-12-13 DIAGNOSIS — K219 Gastro-esophageal reflux disease without esophagitis: Secondary | ICD-10-CM | POA: Diagnosis not present

## 2023-12-13 DIAGNOSIS — Z6830 Body mass index (BMI) 30.0-30.9, adult: Secondary | ICD-10-CM | POA: Diagnosis not present

## 2023-12-13 DIAGNOSIS — I10 Essential (primary) hypertension: Secondary | ICD-10-CM

## 2023-12-13 DIAGNOSIS — M5410 Radiculopathy, site unspecified: Secondary | ICD-10-CM

## 2023-12-13 LAB — BASIC METABOLIC PANEL
BUN: 12 mg/dL (ref 6–23)
CO2: 26 meq/L (ref 19–32)
Calcium: 10.1 mg/dL (ref 8.4–10.5)
Chloride: 104 meq/L (ref 96–112)
Creatinine, Ser: 0.59 mg/dL (ref 0.40–1.20)
GFR: 87.7 mL/min (ref >60.00)
Glucose, Bld: 97 mg/dL (ref 70–99)
Potassium: 4.4 meq/L (ref 3.5–5.1)
Sodium: 137 meq/L (ref 135–145)

## 2023-12-13 MED ORDER — TIZANIDINE HCL 2 MG PO CAPS: 2.0000 mg | ORAL_CAPSULE | Freq: Two times a day (BID) | ORAL | 1 refills | Status: DC | PRN

## 2023-12-13 MED ORDER — SPIRONOLACTONE 25 MG PO TABS: 25.0000 mg | ORAL_TABLET | Freq: Every day | ORAL | 1 refills | Status: DC

## 2023-12-13 MED ORDER — MELOXICAM 15 MG PO TABS: 15.0000 mg | ORAL_TABLET | Freq: Every day | ORAL | 1 refills | Status: DC

## 2023-12-13 MED ORDER — OLMESARTAN MEDOXOMIL 40 MG PO TABS: 40.0000 mg | ORAL_TABLET | Freq: Every day | ORAL | 1 refills | Status: DC

## 2023-12-13 MED ORDER — PANTOPRAZOLE SODIUM 40 MG PO TBEC: 40.0000 mg | DELAYED_RELEASE_TABLET | Freq: Every day | ORAL | 1 refills | Status: DC | PRN

## 2023-12-13 MED ORDER — VERAPAMIL HCL ER 240 MG PO TBCR: 240.0000 mg | EXTENDED_RELEASE_TABLET | Freq: Every day | ORAL | 1 refills | Status: DC

## 2023-12-13 NOTE — Progress Notes (Signed)
Patient Care Team    Relationship Specialty Notifications Start End  Natalia Leatherwood, DO PCP - General Family Medicine  08/18/15   Sallye Lat, MD Consulting Physician Ophthalmology  05/08/16   Rachael Fee, MD Attending Physician Gastroenterology  05/08/16   Keturah Barre, MD Consulting Physician Otolaryngology  06/25/17   Gaylord Shih, Emerge  Specialist  07/01/18   Marlene Lard, MD Consulting Physician Endocrinology  01/28/20     Chief Complaint  Patient presents with   Hypertension    Refill meds. No other questions or concerns    History of Present Ilness: Shelly Leonard, 77 y.o. , female presents today for [facility]  Essential hypertension, benign/morbid obesity Pt reports compliance with verapamil 240 mg nightly, spironolactone 25 mg daily (added last visit), Benicar 40 mg daily. Patient denies chest pain, shortness of breath, dizziness or lower extremity edema.  RF: Hypertension, obesity, family history of heart disease   Gastroesophageal reflux disease, esophagitis presence not specified Patient reports compliance with Protonix 20 mg daily.   Medicine is working works well for her   Hypercalcemia/Thyroid nodule She does have multiple nodules within the thyroid.  Her biopsy was negative for any type of malignancy.  SHe has started to increase her water consumption daily. She is following with endocrinology.      Past Medical History:  Diagnosis Date   Allergy    Anemia    low iron (hgb 8)   Arthritis    knees and hands    Carpal tunnel syndrome    Cataract    Diverticulitis large intestine w/o perforation or abscess w/bleeding    GERD (gastroesophageal reflux disease)    Headache(784.0)    occasional    Hypertension    Lumbar spondylosis    Osteopenia    PONV (postoperative nausea and vomiting)    Vertigo    Vitamin D deficiency    All allergies reviewed No Known Allergies Past Surgical History:  Procedure Laterality Date   BREAST REDUCTION  SURGERY  1982   CARPAL TUNNEL RELEASE     left    COLONOSCOPY     FOOT SURGERY     KNEE ARTHROSCOPY     right knee 2012    OTHER SURGICAL HISTORY     spur removed top of left foot    REDUCTION MAMMAPLASTY  1982   TOTAL KNEE ARTHROPLASTY  04/21/2012   Procedure: TOTAL KNEE ARTHROPLASTY;  Surgeon: [name], MD;  Location: [facility];  Service: Orthopedics;  Laterality: Right;   VAGINAL HYSTERECTOMY  1977   Family History  Problem Relation Age of Onset   Breast cancer Mother        late in life   Diabetes Mother        late in life   Heart disease Father    Lung disease Father    Colon cancer Neg Hx    Esophageal cancer Neg Hx    Stomach cancer Neg Hx    Rectal cancer Neg Hx    Social History   Social History Narrative   Ms Tadesse is widowed (12/2016).    She enjoys music and is involved with her church.    All medications verified Allergies as of 12/13/2023   No Known Allergies      Medication List        Accurate as of December 13, 2023  1:27 PM. If you have any questions, ask your nurse or doctor.  STOP taking these medications    amoxicillin-clavulanate 875-125 MG tablet Commonly known as: AUGMENTIN   traMADol 50 MG tablet Commonly known as: ULTRAM       TAKE these medications    aspirin EC 81 MG tablet Take 81 mg by mouth daily.   fluticasone 50 MCG/ACT nasal spray Commonly known as: FLONASE USE 1 SPRAY(S) IN EACH NOSTRIL TWICE DAILY AS NEEDED FOR NASAL CONGESTION   gabapentin 300 MG capsule Commonly known as: NEURONTIN Take 300 mg by mouth at bedtime.   meloxicam 15 MG tablet Commonly known as: MOBIC Take 1 tablet (15 mg total) by mouth daily.   olmesartan 40 MG tablet Commonly known as: BENICAR Take 1 tablet (40 mg total) by mouth daily.   OMEGA 3 PO Take 1 capsule by mouth daily.   pantoprazole 40 MG tablet Commonly known as: PROTONIX Take 1 tablet (40 mg total) by mouth daily as needed.   PROBIOTIC DAILY PO Take  by mouth.   sennosides-docusate sodium 8.6-50 MG tablet Commonly known as: SENOKOT-S Take 1-2 tablets by mouth at bedtime.   spironolactone 25 MG tablet Commonly known as: ALDACTONE Take 1 tablet (25 mg total) by mouth daily.   tizanidine 2 MG capsule Commonly known as: Zanaflex Take 1 capsule (2 mg total) by mouth 2 (two) times daily as needed for muscle spasms.   verapamil 240 MG CR tablet Commonly known as: CALAN-SR Take 1 tablet (240 mg total) by mouth at bedtime.   VITAMIN D PO Take by mouth.   Xiidra 5 % Soln Generic drug: Lifitegrast Apply 1 drop to eye 2 (two) times daily.           10/02/2023    3:46 PM 04/23/2023    1:41 PM 09/12/2022   10:06 AM 07/31/2022    2:00 PM 08/30/2021   10:19 AM  Depression screen PHQ 2/9  Decreased Interest 0 0 0 0 0  Down, Depressed, Hopeless 0 1 0 0 0  PHQ - 2 Score 0 1 0 0 0       No data to display           Immunizations: Immunization History  Administered Date(s) Administered   PFIZER(Purple Top)SARS-COV-2 Vaccination 03/17/2020, 04/12/2020, 11/30/2020   PNEUMOCOCCAL CONJUGATE-20 12/26/2022   Pfizer Covid-19 Vaccine Bivalent Booster 35yrs & up 10/25/2021   Pneumococcal Conjugate-13 04/01/2014   Pneumococcal Polysaccharide-23 11/29/2015   Td 08/14/2011   Tdap 03/14/2022   Zoster Recombinant(Shingrix) 08/15/2022, 11/14/2022   Zoster, Live 01/20/2013   Objective:  BP (!) 140/80   Pulse 75   Wt 160 lb (72.6 kg)   SpO2 99%   BMI 30.23 kg/m  Physical Exam Vitals and nursing note reviewed.  Constitutional:      General: She is not in acute distress.    Appearance: Normal appearance. She is obese. She is not ill-appearing, toxic-appearing or diaphoretic.  HENT:     Head: Normocephalic and atraumatic.     Mouth/Throat:     Mouth: Mucous membranes are moist.  Eyes:     General: No scleral icterus.       Right eye: No discharge.        Left eye: No discharge.     Extraocular Movements: Extraocular movements  intact.     Conjunctiva/sclera: Conjunctivae normal.     Pupils: Pupils are equal, round, and reactive to light.  Cardiovascular:     Rate and Rhythm: Normal rate and regular rhythm.     Heart sounds: No  murmur heard. Pulmonary:     Effort: Pulmonary effort is normal. No respiratory distress.     Breath sounds: Normal breath sounds. No wheezing, rhonchi or rales.  Musculoskeletal:     Right lower leg: No edema.     Left lower leg: No edema.  Skin:    General: Skin is warm and dry.     Coloration: Skin is not jaundiced or pale.     Findings: No erythema or rash.  Neurological:     Mental Status: She is alert and oriented to person, place, and time. Mental status is at baseline.     Motor: No weakness.     Gait: Gait normal.  Psychiatric:        Mood and Affect: Mood normal.        Behavior: Behavior normal.        Thought Content: Thought content normal.        Judgment: Judgment normal.     Assessment and Plan AILANNY BEAR is a 77 y.o. female present for chronic condition management Essential hypertension, benign Blood pressure has been difficult to control.  Consider cardiology referral if unable to see improvement. Continue olmesartan 40 mg every day Continue spironolactone 25 mg daily Continue verapamil to 240 CR qhs (may increase next visit if needed) -Low-sodium diet, exercise encouraged.  Gastroesophageal reflux disease, esophagitis presence not specified/long term use of ppi Stable Continue Protonix. Vitd, b12 and mag UTD   Hypercalcemia/Thyroid nodule - established with endocrine. Continue routine follow-ups. -Biopsy was benign.   -Continue to work on adequate hydration.  Chronic back pain: Stable Continue Zanaflex 2 mg twice daily as needed. Encouraged her to take before bed.  Patient reports she rarely needs. Continue mobic qd as needed  Return in about 24 weeks (around 05/29/2024) for Routine chronic condition follow-up.   No orders of the defined  types were placed in this encounter.  Orders Placed This Encounter  Procedures   Basic Metabolic Panel (BMET)    Electronically Signed by: Felix Pacini, DO [facility]

## 2023-12-13 NOTE — Patient Instructions (Addendum)

## 2023-12-20 ENCOUNTER — Telehealth: Payer: Self-pay

## 2023-12-20 NOTE — Telephone Encounter (Signed)
CRM. Patient wants to file complaint.  Reason for CRM: Patient was returning call from the office and was upset that she could not reach someone at the actual office.

## 2023-12-27 NOTE — Telephone Encounter (Signed)
 Tried to contact patient. Unable to reach by phone.

## 2024-01-13 ENCOUNTER — Other Ambulatory Visit: Payer: Self-pay

## 2024-01-13 MED ORDER — VERAPAMIL HCL ER 240 MG PO TBCR: 240.0000 mg | EXTENDED_RELEASE_TABLET | Freq: Every day | ORAL | 1 refills | Status: DC

## 2024-01-23 DIAGNOSIS — H2513 Age-related nuclear cataract, bilateral: Secondary | ICD-10-CM | POA: Diagnosis not present

## 2024-02-18 ENCOUNTER — Encounter: Payer: Self-pay | Admitting: Family Medicine

## 2024-02-18 ENCOUNTER — Ambulatory Visit: Payer: Medicare HMO | Admitting: Family Medicine

## 2024-02-18 VITALS — BP 138/88 | HR 85 | Temp 97.4°F | Wt 160.0 lb

## 2024-02-18 DIAGNOSIS — R110 Nausea: Secondary | ICD-10-CM

## 2024-02-18 DIAGNOSIS — R109 Unspecified abdominal pain: Secondary | ICD-10-CM | POA: Diagnosis not present

## 2024-02-18 LAB — CBC WITH DIFFERENTIAL/PLATELET
Basophils Absolute: 0 10*3/uL (ref 0.0–0.1)
Basophils Relative: 0.7 % (ref 0.0–3.0)
Eosinophils Absolute: 0.2 10*3/uL (ref 0.0–0.7)
Eosinophils Relative: 4.3 % (ref 0.0–5.0)
HCT: 40.9 % (ref 36.0–46.0)
Hemoglobin: 13.3 g/dL (ref 12.0–15.0)
Lymphocytes Relative: 32.8 % (ref 12.0–46.0)
Lymphs Abs: 1.9 10*3/uL (ref 0.7–4.0)
MCHC: 32.5 g/dL (ref 30.0–36.0)
MCV: 89.2 fL (ref 78.0–100.0)
Monocytes Absolute: 0.4 10*3/uL (ref 0.1–1.0)
Monocytes Relative: 6.8 % (ref 3.0–12.0)
Neutro Abs: 3.2 10*3/uL (ref 1.4–7.7)
Neutrophils Relative %: 55.4 % (ref 43.0–77.0)
Platelets: 289 10*3/uL (ref 150.0–400.0)
RBC: 4.58 Mil/uL (ref 3.87–5.11)
RDW: 14.8 % (ref 11.5–15.5)
WBC: 5.9 10*3/uL (ref 4.0–10.5)

## 2024-02-18 LAB — COMPREHENSIVE METABOLIC PANEL
ALT: 13 U/L (ref 0–35)
AST: 15 U/L (ref 0–37)
Albumin: 4.3 g/dL (ref 3.5–5.2)
Alkaline Phosphatase: 32 U/L — ABNORMAL LOW (ref 39–117)
BUN: 11 mg/dL (ref 6–23)
CO2: 28 meq/L (ref 19–32)
Calcium: 10.4 mg/dL (ref 8.4–10.5)
Chloride: 103 meq/L (ref 96–112)
Creatinine, Ser: 0.56 mg/dL (ref 0.40–1.20)
GFR: 88.7 mL/min (ref >60.00)
Glucose, Bld: 90 mg/dL (ref 70–99)
Potassium: 4.4 meq/L (ref 3.5–5.1)
Sodium: 136 meq/L (ref 135–145)
Total Bilirubin: 0.5 mg/dL (ref 0.2–1.2)
Total Protein: 7.8 g/dL (ref 6.0–8.3)

## 2024-02-18 LAB — LIPASE: Lipase: 27 U/L (ref 11.0–59.0)

## 2024-02-18 LAB — C-REACTIVE PROTEIN: CRP: <1 mg/dL (ref 0.5–20.0)

## 2024-02-18 MED ORDER — ONDANSETRON 4 MG PO TBDP: 4.0000 mg | ORAL_TABLET | Freq: Three times a day (TID) | ORAL | 0 refills | Status: DC | PRN

## 2024-02-18 NOTE — Progress Notes (Signed)
 Shelly Leonard , [birth year], 78 y.o., female MRN: [id number] Patient Care Team    Relationship Specialty Notifications Start End  Natalia Leatherwood, DO PCP - General Family Medicine  08/18/15   Sallye Lat, MD Consulting Physician Ophthalmology  05/08/16   Rachael Fee, MD (Inactive) Attending Physician Gastroenterology  05/08/16   Keturah Barre, MD Consulting Physician Otolaryngology  06/25/17   Gaylord Shih, Emerge  Specialist  07/01/18   Marlene Lard, MD Consulting Physician Endocrinology  01/28/20     Chief Complaint  Patient presents with   Abdominal Pain    Started last week; abdominal pain, nausea. Some constipation. Pt has taken stool softener.      Subjective: Shelly Leonard is a 78 y.o. Pt presents for an OV with complaints of nausea, upper abdomen and left side of 7-10 days duration.  Associated symptoms include nausea and occasional constipation. Pt endorses darker stools for a few days, "lightening up some" now. Pt has tried nothing to ease their symptoms.  She has used zofran in the past to help with nausea. She has recurrent presumed diverticulitis flare, but has declined CT scan.  Last colonoscopy June 2017, 1 polyp at cecum and multiple diverticuli throughout the entire colon     10/02/2023    3:46 PM 04/23/2023    1:41 PM 09/12/2022   10:06 AM 07/31/2022    2:00 PM 08/30/2021   10:19 AM  Depression screen PHQ 2/9  Decreased Interest 0 0 0 0 0  Down, Depressed, Hopeless 0 1 0 0 0  PHQ - 2 Score 0 1 0 0 0    No Known Allergies Social History   Social History Narrative   Shelly Leonard is widowed (12/2016).    She enjoys music and is involved with her church.   Past Medical History:  Diagnosis Date   Allergy    Anemia    low iron (hgb 8)   Arthritis    knees and hands    Carpal tunnel syndrome    Cataract    Diverticulitis large intestine w/o perforation or abscess w/bleeding    GERD (gastroesophageal reflux disease)    Headache(784.0)     occasional    Hypertension    Lumbar spondylosis    Osteopenia    PONV (postoperative nausea and vomiting)    Vertigo    Vitamin D deficiency    Past Surgical History:  Procedure Laterality Date   BREAST REDUCTION SURGERY  1982   CARPAL TUNNEL RELEASE     left    COLONOSCOPY     FOOT SURGERY     KNEE ARTHROSCOPY     right knee 2012    OTHER SURGICAL HISTORY     spur removed top of left foot    REDUCTION MAMMAPLASTY  1982   TOTAL KNEE ARTHROPLASTY  04/21/2012   Procedure: TOTAL KNEE ARTHROPLASTY;  Surgeon: [name], MD;  Location: [facility];  Service: Orthopedics;  Laterality: Right;   VAGINAL HYSTERECTOMY  1977   Family History  Problem Relation Age of Onset   Breast cancer Mother        late in life   Diabetes Mother        late in life   Heart disease Father    Lung disease Father    Colon cancer Neg Hx    Esophageal cancer Neg Hx    Stomach cancer Neg Hx    Rectal cancer Neg Hx  Allergies as of 02/18/2024   No Known Allergies      Medication List        Accurate as of February 18, 2024  3:07 PM. If you have any questions, ask your nurse or doctor.          aspirin EC 81 MG tablet Take 81 mg by mouth daily.   fluticasone 50 MCG/ACT nasal spray Commonly known as: FLONASE USE 1 SPRAY(S) IN EACH NOSTRIL TWICE DAILY AS NEEDED FOR NASAL CONGESTION   gabapentin 300 MG capsule Commonly known as: NEURONTIN Take 300 mg by mouth at bedtime.   meloxicam 15 MG tablet Commonly known as: MOBIC Take 1 tablet (15 mg total) by mouth daily.   olmesartan 40 MG tablet Commonly known as: BENICAR Take 1 tablet (40 mg total) by mouth daily.   OMEGA 3 PO Take 1 capsule by mouth daily.   ondansetron 4 MG disintegrating tablet Commonly known as: ZOFRAN-ODT Take 1 tablet (4 mg total) by mouth every 8 (eight) hours as needed for nausea or vomiting. Started by: Felix Pacini   pantoprazole 40 MG tablet Commonly known as: PROTONIX Take 1 tablet (40 mg total) by  mouth daily as needed.   PROBIOTIC DAILY PO Take by mouth.   sennosides-docusate sodium 8.6-50 MG tablet Commonly known as: SENOKOT-S Take 1-2 tablets by mouth at bedtime.   spironolactone 25 MG tablet Commonly known as: ALDACTONE Take 1 tablet (25 mg total) by mouth daily.   tizanidine 2 MG capsule Commonly known as: Zanaflex Take 1 capsule (2 mg total) by mouth 2 (two) times daily as needed for muscle spasms.   verapamil 240 MG CR tablet Commonly known as: CALAN-SR Take 1 tablet (240 mg total) by mouth at bedtime.   VITAMIN D PO Take by mouth.   Xiidra 5 % Soln Generic drug: Lifitegrast Apply 1 drop to eye 2 (two) times daily.        All past medical history, surgical history, allergies, family history, immunizations andmedications were updated in the EMR today and reviewed under the history and medication portions of their EMR.     Review of Systems  Constitutional:  Negative for chills, fever and weight loss.  Gastrointestinal:  Positive for constipation, melena and nausea. Negative for diarrhea and vomiting.  Neurological:  Negative for dizziness.   Negative, with the exception of above mentioned in HPI   Objective:  BP 138/88   Pulse 85   Temp (!) 97.4 F (36.3 C)   Wt 160 lb (72.6 kg)   SpO2 99%   BMI 30.23 kg/m  Body mass index is 30.23 kg/m. Physical Exam Vitals and nursing note reviewed.  Constitutional:      General: She is not in acute distress.    Appearance: Normal appearance. She is normal weight. She is not ill-appearing or toxic-appearing.  HENT:     Head: Normocephalic and atraumatic.  Eyes:     General: No scleral icterus.       Right eye: No discharge.        Left eye: No discharge.     Extraocular Movements: Extraocular movements intact.     Conjunctiva/sclera: Conjunctivae normal.     Pupils: Pupils are equal, round, and reactive to light.  Abdominal:     General: Abdomen is flat. Bowel sounds are decreased. There is  distension.     Palpations: Abdomen is soft. There is no shifting dullness, fluid wave, hepatomegaly or mass.     Tenderness: There is  abdominal tenderness in the epigastric area, left upper quadrant and left lower quadrant. There is no right CVA tenderness, left CVA tenderness, guarding or rebound. Negative signs include Murphy's sign and McBurney's sign.     Hernia: No hernia is present.  Skin:    Findings: No rash.  Neurological:     Mental Status: She is alert and oriented to person, place, and time. Mental status is at baseline.     Motor: No weakness.     Coordination: Coordination normal.     Gait: Gait normal.  Psychiatric:        Mood and Affect: Mood normal.        Behavior: Behavior normal.        Thought Content: Thought content normal.        Judgment: Judgment normal.      No results found. No results found. No results found for this or any previous visit (from the past 24 hours).  Assessment/Plan: DASHANAE LONGFIELD is a 78 y.o. female present for OV for  Abdominal pain, unspecified abdominal location (Primary)/nausea Will start with laboratory workup today and CT scan.  Considering her increased amount of presumed diverticular flares causing her discomfort, we need to get a CT abdomen.  She is tender over the left upper quadrant, as well as left lower quadrant on exam today .  Differential diagnosis: Colitis, pancreatitis, diverticulitis, colon mass - CT ABDOMEN PELVIS W CONTRAST; Future - CBC w/Diff - Comp Met (CMET) - Lipase - C-reactive protein - zofran for nausea prescribed Like to wait on treatment until laboratory results received and CT.  Patient agrees with plan   Reviewed expectations re: course of current medical issues. Discussed self-management of symptoms. Outlined signs and symptoms indicating need for more acute intervention. Patient verbalized understanding and all questions were answered. Patient received an After-Visit Summary.    Orders  Placed This Encounter  Procedures   CT ABDOMEN PELVIS W CONTRAST   CBC w/Diff   Comp Met (CMET)   Lipase   C-reactive protein   Meds ordered this encounter  Medications   ondansetron (ZOFRAN-ODT) 4 MG disintegrating tablet    Sig: Take 1 tablet (4 mg total) by mouth every 8 (eight) hours as needed for nausea or vomiting.    Dispense:  20 tablet    Refill:  0   Referral Orders  No referral(s) requested today     Note is dictated utilizing voice recognition software. Although note has been proof read prior to signing, occasional typographical errors still can be missed. If any questions arise, please do not hesitate to call for verification.   electronically signed by:  Felix Pacini, DO  [facility] - [place]

## 2024-02-19 ENCOUNTER — Telehealth: Payer: Self-pay | Admitting: Family Medicine

## 2024-02-19 NOTE — Telephone Encounter (Signed)
 Please inform patient the following information: Bld cell counts, inflammatory marker, liver fx and pancreas enzyme all normal.  We will call with CT results once available.

## 2024-02-19 NOTE — Telephone Encounter (Signed)
Left pt VM to discuss

## 2024-02-24 ENCOUNTER — Telehealth: Payer: Self-pay

## 2024-02-24 NOTE — Telephone Encounter (Signed)
 Safety Zone came in for patient stating that patient was upset because she was charged $50 for a visit on 02/18/2024 that she did not have.  After further investigation and speaking to patient, patient did have an appointment on 02/18/2024 with her PCP. Spoke to patient and she stated that whomever took the complain misunderstood what she was saying.   Per patient, she lover her doctor's office and we have done nothing wrong. Patient stated she does not like having to call into a call center versus how she use to be able to contact her office directly. She stated that this misunderstanding is a prime example to why she prefer to speak to someone directly in her office.   Safety Zone has been reviewed and updated with patient response.

## 2024-02-25 ENCOUNTER — Ambulatory Visit (HOSPITAL_BASED_OUTPATIENT_CLINIC_OR_DEPARTMENT_OTHER)
Admission: RE | Admit: 2024-02-25 | Discharge: 2024-02-25 | Disposition: A | Discharge status: Home / Self Care | Payer: Medicare HMO | Source: Ambulatory Visit | Attending: Family Medicine | Admitting: Family Medicine

## 2024-02-25 DIAGNOSIS — R110 Nausea: Secondary | ICD-10-CM | POA: Insufficient documentation

## 2024-02-25 DIAGNOSIS — K5792 Diverticulitis of intestine, part unspecified, without perforation or abscess without bleeding: Secondary | ICD-10-CM | POA: Diagnosis not present

## 2024-02-25 DIAGNOSIS — R109 Unspecified abdominal pain: Secondary | ICD-10-CM | POA: Diagnosis not present

## 2024-02-25 DIAGNOSIS — K5730 Diverticulosis of large intestine without perforation or abscess without bleeding: Secondary | ICD-10-CM | POA: Diagnosis not present

## 2024-02-25 DIAGNOSIS — K7689 Other specified diseases of liver: Secondary | ICD-10-CM | POA: Diagnosis not present

## 2024-02-25 MED ORDER — IOHEXOL 300 MG/ML  SOLN
100.0000 mL | Freq: Once | INTRAMUSCULAR | Status: AC | PRN
Start: 2024-02-25 — End: 2024-02-25
  Administered 2024-02-25: 85 mL via INTRAVENOUS

## 2024-03-05 DIAGNOSIS — H2511 Age-related nuclear cataract, right eye: Secondary | ICD-10-CM | POA: Diagnosis not present

## 2024-03-05 DIAGNOSIS — H2512 Age-related nuclear cataract, left eye: Secondary | ICD-10-CM | POA: Diagnosis not present

## 2024-03-05 DIAGNOSIS — Z01818 Encounter for other preprocedural examination: Secondary | ICD-10-CM | POA: Diagnosis not present

## 2024-03-09 ENCOUNTER — Telehealth: Payer: Self-pay | Admitting: Family Medicine

## 2024-03-09 DIAGNOSIS — K639 Disease of intestine, unspecified: Secondary | ICD-10-CM | POA: Insufficient documentation

## 2024-03-09 MED ORDER — AMOXICILLIN-POT CLAVULANATE 875-125 MG PO TABS: 1.0000 | ORAL_TABLET | Freq: Two times a day (BID) | ORAL | 0 refills | Status: DC

## 2024-03-09 NOTE — Telephone Encounter (Signed)
 Please call patient Her CT of the abdomen did show evidence of diverticulosis in the sigmoid colon with evidence of mild wall thickening around this area.  This can be concerning for an acute flare on top of any chronic flares it can also be concerning for other causes of colon thickening which required follow-up with a gastroenterologist for further evaluation.  I did call in a course of Augmentin antibiotic for her.  She should start this if she has any fevers or residual abdominal pain present.  Otherwise she can hold onto for future flares.  I have placed a referral back to [facility] gastroenterology for her further evaluation of colon thickening and recurrent abdominal pain

## 2024-03-09 NOTE — Telephone Encounter (Signed)
LVM to discuss with pt.

## 2024-03-10 NOTE — Telephone Encounter (Signed)
 Pt advised of results.

## 2024-04-16 ENCOUNTER — Ambulatory Visit: Admitting: Gastroenterology

## 2024-04-16 ENCOUNTER — Encounter: Payer: Self-pay | Admitting: Gastroenterology

## 2024-04-16 VITALS — BP 140/100 | HR 88 | Ht 61.0 in | Wt 160.1 lb

## 2024-04-16 DIAGNOSIS — K5732 Diverticulitis of large intestine without perforation or abscess without bleeding: Secondary | ICD-10-CM

## 2024-04-16 DIAGNOSIS — R935 Abnormal findings on diagnostic imaging of other abdominal regions, including retroperitoneum: Secondary | ICD-10-CM

## 2024-04-16 NOTE — Progress Notes (Signed)
 Chief Complaint: Evaluation of colon thickening and recurrent abdominal pain on CT scan Primary GI Doctor: Dr. Venice Gillis  HPI: Shelly Leonard is a pleasant 78 year old female, established with Dr. Venice Gillis. She was last seen here in June 2024. She does have history of constipation, and diverticular disease with recurrent diverticulitis.  Also with history of GERD, hypertension and obesity. Last colonoscopy was done in 2017 with numerous diverticuli throughout the colon.  She had removal of a 3 mm polyp from the cecum which path showed to be benign colonic mucosa. Recall 10 years.  At patient's last visit patient complained of recent episode of epigastric pain and nausea.  She has been on low-dose Protonix  at 20 mg p.o. daily for GERD and this was increased to 40 mg p.o. daily.  Patient was also given prescription for Zofran  as needed for nausea.  Patient overall feeling better but her main concern is intermittent episodes of bloating in the lower abdomen. Labs done at that time showed a CRP of less than 3, CBC within normal limits, LFTs normal, lipase normal and H. pylori IgG was negative. She did not have any abdominal imaging.  02/18/2024 patient seen by PCP with complaints of abdominal pain with nausea.  CT scan showed evidence of mild wall thickening and concerns for acute diverticulitis.  Patient was giving a course of Augmentin  on 03/09/24.   Interval History    Patient presents for follow-up after recent bout of diverticulitis.Patient reports she completed the Augmentin  prescribed by her PCP. She states the pain has improved. She increased her daily fiber intake which has kept her bowels regular. She reports she has a bout of diverticulitis 1-2 times a year. She is also using IBgard which helps with the gas and bloat, but she states it is expensive.     Patient has history of GERD and taking Protonix  40 mg p.o. daily. Patient denies dysphagia Patient denies nausea, vomiting, or weight loss.  Wt Readings  from Last 3 Encounters:  04/16/24 160 lb 2 oz (72.6 kg)  02/18/24 160 lb (72.6 kg)  12/13/23 160 lb (72.6 kg)    Past Medical History:  Diagnosis Date   Allergy    Anemia    low iron (hgb 8)   Arthritis    knees and hands    Carpal tunnel syndrome    Cataract    Diverticulitis large intestine w/o perforation or abscess w/bleeding    GERD (gastroesophageal reflux disease)    Headache(784.0)    occasional    Hypertension    Lumbar spondylosis    Osteopenia    PONV (postoperative nausea and vomiting)    Vertigo    Vitamin D  deficiency     Past Surgical History:  Procedure Laterality Date   BREAST REDUCTION SURGERY  1982   CARPAL TUNNEL RELEASE     left    COLONOSCOPY     FOOT SURGERY     KNEE ARTHROSCOPY     right knee 2012    OTHER SURGICAL HISTORY     spur removed top of left foot    REDUCTION MAMMAPLASTY  1982   TOTAL KNEE ARTHROPLASTY  04/21/2012   Procedure: TOTAL KNEE ARTHROPLASTY;  Surgeon: Bevin Bucks, MD;  Location: [facility];  Service: Orthopedics;  Laterality: Right;   VAGINAL HYSTERECTOMY  1977    Current Outpatient Medications  Medication Sig Dispense Refill   aspirin  EC 81 MG tablet Take 81 mg by mouth daily.     Cholecalciferol (VITAMIN D  PO)  Take by mouth.     fluticasone  (FLONASE ) 50 MCG/ACT nasal spray USE 1 SPRAY(S) IN EACH NOSTRIL TWICE DAILY AS NEEDED FOR NASAL CONGESTION 16 g 5   gabapentin (NEURONTIN) 300 MG capsule Take 300 mg by mouth at bedtime.     meloxicam  (MOBIC ) 15 MG tablet Take 1 tablet (15 mg total) by mouth daily. 90 tablet 1   olmesartan  (BENICAR ) 40 MG tablet Take 1 tablet (40 mg total) by mouth daily. 90 tablet 1   Omega-3 Fatty Acids (OMEGA 3 PO) Take 1 capsule by mouth daily.     ondansetron  (ZOFRAN -ODT) 4 MG disintegrating tablet Take 1 tablet (4 mg total) by mouth every 8 (eight) hours as needed for nausea or vomiting. 20 tablet 0   pantoprazole  (PROTONIX ) 40 MG tablet Take 1 tablet (40 mg total) by mouth daily as needed. 90  tablet 1   Probiotic Product (PROBIOTIC DAILY PO) Take by mouth.     PROLIA  60 MG/ML SOSY injection Inject 60 mg into the skin every 6 (six) months.     sennosides-docusate sodium  (SENOKOT-S) 8.6-50 MG tablet Take 1-2 tablets by mouth at bedtime. 60 tablet 5   spironolactone  (ALDACTONE ) 25 MG tablet Take 1 tablet (25 mg total) by mouth daily. 90 tablet 1   tizanidine  (ZANAFLEX ) 2 MG capsule Take 1 capsule (2 mg total) by mouth 2 (two) times daily as needed for muscle spasms. 180 capsule 1   verapamil  (CALAN -SR) 240 MG CR tablet Take 1 tablet (240 mg total) by mouth at bedtime. 90 tablet 1   XIIDRA 5 % SOLN Apply 1 drop to eye 2 (two) times daily.     No current facility-administered medications for this visit.    Allergies as of 04/16/2024   (No Known Allergies)    Family History  Problem Relation Age of Onset   Breast cancer Mother        late in life   Diabetes Mother        late in life   Heart disease Father    Lung disease Father    Colon cancer Neg Hx    Esophageal cancer Neg Hx    Stomach cancer Neg Hx    Rectal cancer Neg Hx     Review of Systems:    Constitutional: No weight loss, fever, chills, weakness or fatigue HEENT: Eyes: No change in vision               Ears, Nose, Throat:  No change in hearing or congestion Skin: No rash or itching Cardiovascular: No chest pain, chest pressure or palpitations   Respiratory: No SOB or cough Gastrointestinal: See HPI and otherwise negative Genitourinary: No dysuria or change in urinary frequency Neurological: No headache, dizziness or syncope Musculoskeletal: No new muscle or joint pain Hematologic: No bleeding or bruising Psychiatric: No history of depression or anxiety    Physical Exam:  Vital signs: BP (!) 150/96 (BP Location: Left Arm, Patient Position: Sitting, Cuff Size: Normal)   Pulse 88   Ht 5\' 1"  (1.549 m) Comment: height measured without shoes  Wt 160 lb 2 oz (72.6 kg)   BMI 30.26 kg/m   Constitutional:    Pleasant  female appears to be in NAD, Well developed, Well nourished, alert and cooperative Throat: Oral cavity and pharynx without inflammation, swelling or lesion.  Respiratory: Respirations even and unlabored. Lungs clear to auscultation bilaterally.   No wheezes, crackles, or rhonchi.  Cardiovascular: Normal S1, S2. Regular rate and rhythm. No  peripheral edema, cyanosis or pallor.  Gastrointestinal:  Soft, nondistended, nontender. No rebound or guarding. Normal bowel sounds. No appreciable masses or hepatomegaly. Rectal:  Not performed.  Msk:  Symmetrical without gross deformities. Without edema, no deformity or joint abnormality.  Neurologic:  Alert and  oriented x4;  grossly normal neurologically.  Skin:   Dry and intact without significant lesions or rashes. Psychiatric: Oriented to person, place and time. Demonstrates good judgement and reason without abnormal affect or behaviors.  RELEVANT LABS AND IMAGING: CBC    Latest Ref Rng & Units 02/18/2024    1:44 PM 06/07/2023    2:43 PM 04/01/2023    1:19 PM  CBC  WBC 4.0 - 10.5 K/uL 5.9  6.8  6.2   Hemoglobin 12.0 - 15.0 g/dL 60.4  54.0  98.1   Hematocrit 36.0 - 46.0 % 40.9  41.4  40.5   Platelets 150.0 - 400.0 K/uL 289.0  306  265.0      CMP     Latest Ref Rng & Units 02/18/2024    1:44 PM 12/13/2023    1:21 PM 06/07/2023    2:43 PM  CMP  Glucose 70 - 99 mg/dL 90  97  93   BUN 6 - 23 mg/dL 11  12  10    Creatinine 0.40 - 1.20 mg/dL 1.91  4.78  2.95   Sodium 135 - 145 mEq/L 136  137  139   Potassium 3.5 - 5.1 mEq/L 4.4  4.4  3.5   Chloride 96 - 112 mEq/L 103  104  103   CO2 19 - 32 mEq/L 28  26  24    Calcium 8.4 - 10.5 mg/dL 62.1  30.8  65.7   Total Protein 6.0 - 8.3 g/dL 7.8   7.6   Total Bilirubin 0.2 - 1.2 mg/dL 0.5   0.6   Alkaline Phos 39 - 117 U/L 32     AST 0 - 37 U/L 15   15   ALT 0 - 35 U/L 13   13      Lab Results  Component Value Date   TSH 0.53 04/01/2023  02/25/2024 CT abdomen pelvis with  contrast IMPRESSION: 1. Colonic diverticulosis with short segment mild wall thickening of the sigmoid colon without significant pericolonic fluid, possibly reflecting mild acute uncomplicated colitis/diverticulitis. However, given the appearance with consider further evaluation by colonoscopy. 2. Aortic atherosclerosis.  Assessment: Encounter Diagnoses  Name Primary?   Abnormal abdominal CT scan Yes   Diverticulitis of colon     78 year old female patient with recent bout of abdominal pain with altered bowel habits.  CT scan showed short segment mild wall thickening of the sigmoid colon possibly reflecting colitis/diverticulitis.  Patient given Augmentin  for 10 days and states her symptoms have since then resolved.  Recommended patient have follow-up colonoscopy to evaluate for reoccurring diverticulitis, however patient declined.  Encouraged high-fiber diet and can use over-the-counter MiraLAX  as needed for constipation.  Her reflux disease is controlled with Protonix  40 mg p.o. daily.  No changes needed   Plan: - OTC IBgard samples provided -Recommend high fiber diet -Advised to go to the ER if there is any severe abdominal pain, unable to hold down food/water, blood in stool or vomit, chest pain, shortness of breath, or any worsening symptoms.   -Recommend colonoscopy today, patient declines. - Continue pantoprazole  40 mg p.o. daily -follow-up as needed   Thank you for the courtesy of this consult. Please call me with any questions or concerns.  Dodger Sinning, FNP-C [facility] 04/16/2024, 2:30 PM  Cc: Napolean Backbone A, DO

## 2024-04-16 NOTE — Patient Instructions (Addendum)
 OTC IBgard samples provided prn gas bloat and abdominal discomfort   Recommend high fiber diet Recommend OTC Miralax  as needed for constipation  Continue Protonix  40 mg po daily for reflux  Recommend colonoscopy today for reoccurring diverticulitis and CT findings showing inflammation in the colon. Please call the office if you change your mind and want to schedule.  Advised to go to the ER if there is any severe abdominal pain, unable to hold down food/water, blood in stool or vomit, chest pain, shortness of breath, or any worsening symptoms.    _______________________________________________________  If your blood pressure at your visit was 140/90 or greater, please contact your primary care physician to follow up on this.  _______________________________________________________  If you are age 61 or older, your body mass index should be between 23-30. Your Body mass index is 30.26 kg/m. If this is out of the aforementioned range listed, please consider follow up with your Primary Care Provider.  If you are age 60 or younger, your body mass index should be between 19-25. Your Body mass index is 30.26 kg/m. If this is out of the aformentioned range listed, please consider follow up with your Primary Care Provider.   ________________________________________________________  The [facility] providers would like to encourage you to use MYCHART to communicate with providers for non-urgent requests or questions.  Due to long hold times on the telephone, sending your provider a message by [facility] may be a faster and more efficient way to get a response.  Please allow 48 business hours for a response.  Please remember that this is for non-urgent requests.  _______________________________________________________ Thank you for trusting me with your gastrointestinal care!   Dyanna Glasgow, NP

## 2024-04-24 ENCOUNTER — Ambulatory Visit: Admitting: Family Medicine

## 2024-04-27 ENCOUNTER — Other Ambulatory Visit: Payer: Self-pay

## 2024-04-27 MED ORDER — PROLIA 60 MG/ML ~~LOC~~ SOSY: 60.0000 mg | PREFILLED_SYRINGE | SUBCUTANEOUS | 1 refills | Status: AC

## 2024-04-29 ENCOUNTER — Ambulatory Visit

## 2024-04-29 DIAGNOSIS — M810 Age-related osteoporosis without current pathological fracture: Secondary | ICD-10-CM

## 2024-04-29 MED ORDER — DENOSUMAB 60 MG/ML ~~LOC~~ SOSY
60.0000 mg | PREFILLED_SYRINGE | Freq: Once | SUBCUTANEOUS | Status: AC
  Administered 2024-04-29: 60 mg via SUBCUTANEOUS

## 2024-04-29 NOTE — Progress Notes (Signed)
 Pt came for prolia injection. Pt provided medication at appt. Pt tolerated administer medication.

## 2024-04-30 ENCOUNTER — Ambulatory Visit

## 2024-05-15 DIAGNOSIS — I10 Essential (primary) hypertension: Secondary | ICD-10-CM | POA: Diagnosis not present

## 2024-05-15 DIAGNOSIS — H2511 Age-related nuclear cataract, right eye: Secondary | ICD-10-CM | POA: Diagnosis not present

## 2024-06-02 ENCOUNTER — Ambulatory Visit (INDEPENDENT_AMBULATORY_CARE_PROVIDER_SITE_OTHER): Admitting: Family Medicine

## 2024-06-02 ENCOUNTER — Ambulatory Visit: Payer: Self-pay

## 2024-06-02 ENCOUNTER — Encounter: Payer: Self-pay | Admitting: Family Medicine

## 2024-06-02 VITALS — BP 146/94 | HR 90 | Temp 98.0°F | Wt 157.0 lb

## 2024-06-02 DIAGNOSIS — R1032 Left lower quadrant pain: Secondary | ICD-10-CM | POA: Diagnosis not present

## 2024-06-02 DIAGNOSIS — K529 Noninfective gastroenteritis and colitis, unspecified: Secondary | ICD-10-CM

## 2024-06-02 MED ORDER — AZITHROMYCIN 250 MG PO TABS: ORAL_TABLET | ORAL | 0 refills | Status: AC

## 2024-06-02 MED ORDER — ONDANSETRON 4 MG PO TBDP: 4.0000 mg | ORAL_TABLET | Freq: Three times a day (TID) | ORAL | 0 refills | Status: DC | PRN

## 2024-06-02 MED ORDER — DICYCLOMINE HCL 10 MG PO CAPS: 10.0000 mg | ORAL_CAPSULE | Freq: Three times a day (TID) | ORAL | 0 refills | Status: DC

## 2024-06-02 NOTE — Telephone Encounter (Signed)
 Pt scheduled

## 2024-06-02 NOTE — Patient Instructions (Signed)
 Salmonella Gastroenteritis, Adult Salmonella gastroenteritis is an infection of the stomach and intestines. It can cause nausea, vomiting, fever, and other symptoms. Fever usually lasts for 2-3 days with diarrhea lasting 4-10 days. Most people recover completely, but some people may develop lasting problems, such as arthritis, irritation of the eyes, or painful urination. What are the causes? This condition is caused by Salmonella bacteria. These bacteria can spread to people through food that is not cooked properly, contact with animals that have the bacteria, or contact with a person's stool (feces). You can get this infection by: Eating food or drinking liquids that have the bacteria (are contaminated). Drinking polluted standing water. Coming into contact with an animal that is carrying the bacteria, such as a turtle, bird, snake, or iguana. What increases the risk? This condition is more likely to develop in: Adults over the age of 21 years. People with a weakened disease-fighting system (immune system). People with poor personal hygiene or poor kitchen cleanliness. People who have contact with animals that are known to carry the bacteria. People taking certain medicines, such as stomach acid reducers. People who have recently traveled to a developing country. What are the signs or symptoms? Symptoms of this condition include: Diarrhea, which may be bloody. Abdominal pain or cramps. Fever or chills. Nausea. Vomiting. Headache. How is this diagnosed? This condition may be diagnosed based on: Your symptoms. Your medical history. A physical exam. A blood test. A stool test. How is this treated? This condition may be managed by: Drinking plenty of fluids. This is important because this infection can make you lose a lot of fluid (become dehydrated). Taking antibiotic medicines. These may be given if your condition is severe. Medicines may help shorten your illness. Follow these  instructions at home: Medicines Take over-the-counter and prescription medicines only as told by your health care provider. Do not take medicines to help with diarrhea. These medicines can make the infection worse. If you were prescribed an antibiotic, take it as told by your health care provider. Do not stop taking the antibiotic even if you start to feel better. Eating and drinking     Drink enough fluid to keep your urine pale yellow. This helps prevent dehydration. Drink only clear liquids until your diarrhea, nausea, or vomiting is under control. Clear liquids are liquids you can see through, such as water, broth, diluted fruit juice, low-calorie sports drinks, or non-caffeinated tea. Take an oral rehydration solution (ORS) as told by your health care provider. This drink is sold at pharmacies and retail stores. If you are not hungry, do not force yourself to eat. Eat bland, easy-to-digest foods in small amounts as you are able. These foods include rice, lean meats, toast, and crackers. Avoid: Fluids that contain a lot of sugar or caffeine, such as energy drinks, high-calorie sports drinks, and soda. Alcohol. Foods that are greasy or contain a lot of fat or sugar. Spicy foods. Food safety  Keep refrigerated foods colder than 19F (5C). Serve hot foods immediately or keep them heated above 119F (60C). Always cook meat, eggs, seafood, and poultry thoroughly. Do not eat or drink unpasteurized (raw) dairy products. Wash your hands thoroughly after handling or preparing meat, eggs, seafood, and poultry. Use separate food preparation surfaces and storage spaces for raw meat and for fruits and vegetables. Wash your hands, food preparation surfaces, and utensils thoroughly before and after you handle raw foods. General instructions Wash your hands often with soap and hot water for at least 20  seconds. If soap and water are not available, use hand sanitizer. This helps keep the bacteria  from spreading to others. Wash your hands before eating. Do not prepare food for others if you have diarrhea. Keep track of changes in your weight. Losing a lot of weight can be a sign of a serious problem. Ask your health care provider how much weight loss should concern you. Stay home from work or school as told by your health care provider. Keep all follow-up visits. This is important. Contact a health care provider if you: Have a fever. Feel weak or dizzy. Have a headache. Have diarrhea that has blood or mucus in it. Have urinated only a small amount of very dark urine over 6-8 hours. Develop these symptoms later: Weight loss. Pain when urinating. Swelling or a feeling of warmth in a joint. Redness, irritation, or pain in your eyes. Get help right away if you: Cannot drink fluids without vomiting or having diarrhea. Have severe pain in the abdomen. Have changes in vision or loss of vision. Have severe symptoms of dehydration. These include: Not urinating in 6-8 hours. Cold and clammy skin. Extreme thirst. Confusion. Rapid breathing. Difficulty waking from sleep. These symptoms may represent a serious problem that is an emergency. Do not wait to see if the symptoms will go away. Get medical help right away. Call your local emergency services (911 in the [place] Do not drive yourself to the hospital. Summary Salmonella gastroenteritis is an infection of the intestines. It can cause nausea, vomiting, and other symptoms. It is caused by Salmonella bacteria. People can get this condition by eating foods or drinking liquids that have the bacteria, or by coming into contact with an animal that is carrying the bacteria. People with the condition need to drink plenty of fluids to avoid dehydration. Treatment for a severe case may include antibiotic medicines. Follow your health care provider's instructions for taking medicines, eating and drinking, handling food in a safe way, and knowing  when to get help. This information is not intended to replace advice given to you by your health care provider. Make sure you discuss any questions you have with your health care provider. Document Revised: 04/04/2021 Document Reviewed: 04/04/2021 Elsevier Patient Education  2024 [employer].

## 2024-06-02 NOTE — Progress Notes (Signed)
 Shelly Leonard , 1946-04-20, 78 y.o., female MRN: [id number] Patient Care Team    Relationship Specialty Notifications Start End  Mariel Shope, DO PCP - General Family Medicine  08/18/15   Devin Foerster, MD Consulting Physician Ophthalmology  05/08/16   Janel Medford, MD (Inactive) Attending Physician Gastroenterology  05/08/16   Deena Farrier, MD Consulting Physician Otolaryngology  06/25/17   Amos Balint, Emerge  Specialist  07/01/18   Alta Ast, MD Consulting Physician Endocrinology  01/28/20     Chief Complaint  Patient presents with   Diarrhea    Over 24 hours; diarrhea, abdominal pain, nausea. Pt has taken tylenol .      Subjective: Shelly Leonard is a 78 y.o. Pt presents for an OV with complaints of right thumb discomfort, nausea and diarrhea of 24 hours duration.  Associated symptoms include greater than 10 watery stools in a 24-hour. She denies fevers, chills, blood per rectum or vomiting.  She is tolerating p.o. but she is very nauseated.  She has not been exposed to any sick contacts that she is aware of.  No recent antibiotic use.  No recent travel. She has a history of frequent diverticular flares, however those do not cause her to have diarrhea.  She recently was seen by GI for thickening of the colon.  They also presumed it was from her diverticulitis but patient declined colonoscopy for further evaluation. Pt has tried hydration and a cream for rectal area to ease their symptoms.  She denies consuming any known under prepared foods or contaminated foods.  She reports she did have an egg yesterday, that was out of a new carton.     10/02/2023    3:46 PM 04/23/2023    1:41 PM 09/12/2022   10:06 AM 07/31/2022    2:00 PM 08/30/2021   10:19 AM  Depression screen PHQ 2/9  Decreased Interest 0 0 0 0 0  Down, Depressed, Hopeless 0 1 0 0 0  PHQ - 2 Score 0 1 0 0 0    No Known Allergies Social History   Social History Narrative   Ms Fear is widowed  (12/2016).    She enjoys music and is involved with her church.   Past Medical History:  Diagnosis Date   Allergy    Anemia    low iron (hgb 8)   Arthritis    knees and hands    Carpal tunnel syndrome    Cataract    Diverticulitis large intestine w/o perforation or abscess w/bleeding    GERD (gastroesophageal reflux disease)    Headache(784.0)    occasional    Hypertension    Lumbar spondylosis    Osteopenia    PONV (postoperative nausea and vomiting)    Vertigo    Vitamin D  deficiency    Past Surgical History:  Procedure Laterality Date   BREAST REDUCTION SURGERY  1982   CARPAL TUNNEL RELEASE     left    COLONOSCOPY     FOOT SURGERY     KNEE ARTHROSCOPY     right knee 2012    OTHER SURGICAL HISTORY     spur removed top of left foot    REDUCTION MAMMAPLASTY  1982   TOTAL KNEE ARTHROPLASTY  04/21/2012   Procedure: TOTAL KNEE ARTHROPLASTY;  Surgeon: Bevin Bucks, MD;  Location: [facility];  Service: Orthopedics;  Laterality: Right;   VAGINAL HYSTERECTOMY  1977   Family History  Problem Relation Age  of Onset   Breast cancer Mother        late in life   Diabetes Mother        late in life   Heart disease Father    Lung disease Father    Colon cancer Neg Hx    Esophageal cancer Neg Hx    Stomach cancer Neg Hx    Rectal cancer Neg Hx    Allergies as of 06/02/2024   No Known Allergies      Medication List        Accurate as of June 02, 2024  2:52 PM. If you have any questions, ask your nurse or doctor.          aspirin  EC 81 MG tablet Take 81 mg by mouth daily.   azithromycin 250 MG tablet Commonly known as: ZITHROMAX Take 2 tablets on day 1, then 1 tablet daily on days 2 through 5 Started by: Napolean Backbone   dicyclomine 10 MG capsule Commonly known as: BENTYL Take 1 capsule (10 mg total) by mouth 4 (four) times daily -  before meals and at bedtime. Started by: Saad Buhl   fluticasone  50 MCG/ACT nasal spray Commonly known as: FLONASE  USE 1  SPRAY(S) IN EACH NOSTRIL TWICE DAILY AS NEEDED FOR NASAL CONGESTION   gabapentin 300 MG capsule Commonly known as: NEURONTIN Take 300 mg by mouth at bedtime.   meloxicam  15 MG tablet Commonly known as: MOBIC  Take 1 tablet (15 mg total) by mouth daily.   olmesartan  40 MG tablet Commonly known as: BENICAR  Take 1 tablet (40 mg total) by mouth daily.   OMEGA 3 PO Take 1 capsule by mouth daily.   ondansetron  4 MG disintegrating tablet Commonly known as: ZOFRAN -ODT Take 1 tablet (4 mg total) by mouth every 8 (eight) hours as needed for nausea or vomiting.   pantoprazole  40 MG tablet Commonly known as: PROTONIX  Take 1 tablet (40 mg total) by mouth daily as needed.   PROBIOTIC DAILY PO Take by mouth.   Prolia  60 MG/ML Sosy injection Generic drug: denosumab  Inject 60 mg into the skin every 6 (six) months.   sennosides-docusate sodium  8.6-50 MG tablet Commonly known as: SENOKOT-S Take 1-2 tablets by mouth at bedtime.   spironolactone  25 MG tablet Commonly known as: ALDACTONE  Take 1 tablet (25 mg total) by mouth daily.   tizanidine  2 MG capsule Commonly known as: Zanaflex  Take 1 capsule (2 mg total) by mouth 2 (two) times daily as needed for muscle spasms.   verapamil  240 MG CR tablet Commonly known as: CALAN -SR Take 1 tablet (240 mg total) by mouth at bedtime.   VITAMIN D  PO Take by mouth.   Xiidra 5 % Soln Generic drug: Lifitegrast Apply 1 drop to eye 2 (two) times daily.        All past medical history, surgical history, allergies, family history, immunizations andmedications were updated in the EMR today and reviewed under the history and medication portions of their EMR.     ROS Negative, with the exception of above mentioned in HPI   Objective:  BP (!) 146/94   Pulse 90   Temp 98 F (36.7 C)   Wt 157 lb (71.2 kg)   SpO2 96%   BMI 29.66 kg/m  Body mass index is 29.66 kg/m. Physical Exam Vitals and nursing note reviewed.  Constitutional:       General: She is not in acute distress.    Appearance: Normal appearance. She is normal weight. She  is not ill-appearing or toxic-appearing.  HENT:     Head: Normocephalic and atraumatic.  Eyes:     General: No scleral icterus.       Right eye: No discharge.        Left eye: No discharge.     Extraocular Movements: Extraocular movements intact.     Conjunctiva/sclera: Conjunctivae normal.     Pupils: Pupils are equal, round, and reactive to light.  Pulmonary:     Effort: Pulmonary effort is normal.     Breath sounds: Normal breath sounds.  Abdominal:     General: Abdomen is flat. Bowel sounds are increased. There is distension.     Palpations: Abdomen is soft. There is no fluid wave or mass.     Tenderness: There is abdominal tenderness in the suprapubic area and left lower quadrant. There is no guarding or rebound.     Hernia: No hernia is present.    Skin:    Findings: No rash.  Neurological:     Mental Status: She is alert and oriented to person, place, and time. Mental status is at baseline.     Motor: No weakness.     Coordination: Coordination normal.     Gait: Gait normal.  Psychiatric:        Mood and Affect: Mood normal.        Behavior: Behavior normal.        Thought Content: Thought content normal.        Judgment: Judgment normal.      No results found. No results found. No results found for this or any previous visit (from the past 24 hours).  Assessment/Plan: Shelly Leonard is a 78 y.o. female present for OV for  Left lower quadrant abdominal pain (Primary)/gastroenteritis Patient's symptom onset and exam is consistent with gastroenteritis today. We considered possible Salmonella contamination of the eggs she purchased.  There have been a recent recall. Bentyl twice daily AC and nightly for the next 5-7 days as needed Azithromycin prescribed Zofran  prescribed for nausea Hydration with water and electrolyte replacement Bland diet, avoiding dairy.   Advance diet as tolerated. Follow-up in 1 week if symptoms are not improving, or sooner if worsening.   Reviewed expectations re: course of current medical issues. Discussed self-management of symptoms. Outlined signs and symptoms indicating need for more acute intervention. Patient verbalized understanding and all questions were answered. Patient received an After-Visit Summary.    No orders of the defined types were placed in this encounter.  Meds ordered this encounter  Medications   dicyclomine (BENTYL) 10 MG capsule    Sig: Take 1 capsule (10 mg total) by mouth 4 (four) times daily -  before meals and at bedtime.    Dispense:  90 capsule    Refill:  0   azithromycin (ZITHROMAX) 250 MG tablet    Sig: Take 2 tablets on day 1, then 1 tablet daily on days 2 through 5    Dispense:  6 tablet    Refill:  0   ondansetron  (ZOFRAN -ODT) 4 MG disintegrating tablet    Sig: Take 1 tablet (4 mg total) by mouth every 8 (eight) hours as needed for nausea or vomiting.    Dispense:  20 tablet    Refill:  0   Referral Orders  No referral(s) requested today     Note is dictated utilizing voice recognition software. Although note has been proof read prior to signing, occasional typographical errors still can be missed. If  any questions arise, please do not hesitate to call for verification.   electronically signed by:  Napolean Backbone, DO  [facility] [place]

## 2024-06-02 NOTE — Telephone Encounter (Signed)
 FYI Only or Action Required?: Action required by provider  Patient was last seen in primary care on 02/18/2024 by Napolean Backbone A, DO. Called Nurse Triage reporting Diarrhea. Symptoms began yesterday. Interventions attempted: OTC medications:  Aaron Aas Symptoms are: gradually worsening.  Triage Disposition: Home Care  Patient/caregiver understands and will follow disposition?: Yes CRM [phone]. Reason for Triage: Patient is calling because she is experiencing stomach pain and diarrhea.  Reason for Disposition  MILD-MODERATE diarrhea (e.g., 1-6 times / day more than normal)  Answer Assessment - Initial Assessment Questions 1. DIARRHEA SEVERITY: "How bad is the diarrhea?" "How many more stools have you had in the past 24 hours than normal?"    - NO DIARRHEA (SCALE 0)   - MILD (SCALE 1-3): Few loose or mushy BMs; increase of 1-3 stools over normal daily number of stools; mild increase in ostomy output.   -  MODERATE (SCALE 4-7): Increase of 4-6 stools daily over normal; moderate increase in ostomy output.   -  SEVERE (SCALE 8-10; OR "WORST POSSIBLE"): Increase of 7 or more stools daily over normal; moderate increase in ostomy output; incontinence.     moderate 2. ONSET: "When did the diarrhea begin?"      yesterday 3. BM CONSISTENCY: "How loose or watery is the diarrhea?"      Watery, loose stools 4. VOMITING: "Are you also vomiting?" If Yes, ask: "How many times in the past 24 hours?"      No but nausea 5. ABDOMEN PAIN: "Are you having any abdomen pain?" If Yes, ask: "What does it feel like?" (e.g., crampy, dull, intermittent, constant)      Upper abd and having bloating 6. ABDOMEN PAIN SEVERITY: If present, ask: "How bad is the pain?"  (e.g., Scale 1-10; mild, moderate, or severe)   - MILD (1-3): doesn't interfere with normal activities, abdomen soft and not tender to touch    - MODERATE (4-7): interferes with normal activities or awakens from sleep, abdomen tender to touch    - SEVERE (8-10):  excruciating pain, doubled over, unable to do any normal activities       8 or 9/10 pain: taking OTC medications but no relief 7. ORAL INTAKE: If vomiting, "Have you been able to drink liquids?" "How much liquids have you had in the past 24 hours?"     yes 8. HYDRATION: "Any signs of dehydration?" (e.g., dry mouth [not just dry lips], too weak to stand, dizziness, new weight loss) "When did you last urinate?"     N/a 9. EXPOSURE: "Have you traveled to a foreign country recently?" "Have you been exposed to anyone with diarrhea?" "Could you have eaten any food that was spoiled?"     N/a 10. ANTIBIOTIC USE: "Are you taking antibiotics now or have you taken antibiotics in the past 2 months?"       N/a 11. OTHER SYMPTOMS: "Do you have any other symptoms?" (e.g., fever, blood in stool)       Nausea without vomiting 12. PREGNANCY: "Is there any chance you are pregnant?" "When was your last menstrual period?"       N/a  Pt states history of diverticulitis and states this is a flare up.  Pt would like something called in to help her r/t diarrhea & nausea.  Please call patient & and prescriptions [id number] - on file.    History diverticulitis - pt states you are a flare up  Protocols used: Diarrhea-A-AH

## 2024-06-02 NOTE — Telephone Encounter (Signed)
LVM to discuss with pt.

## 2024-06-02 NOTE — Telephone Encounter (Signed)
 Pt is unable to perform a virtual appt and has declined opening a mychart account.  Pt must be seen in the office for an appt. Or be seen by urgent/emergent care.   Telephone only appts are not covered by insurance, not appropriate for abd pain, and not offered at this office.

## 2024-06-03 ENCOUNTER — Telehealth: Payer: Self-pay | Admitting: Pharmacy Technician

## 2024-06-03 ENCOUNTER — Other Ambulatory Visit (HOSPITAL_COMMUNITY): Payer: Self-pay

## 2024-06-03 NOTE — Telephone Encounter (Signed)
 Pharmacy Patient Advocate Encounter   Received notification from CoverMyMeds that prior authorization for Dicyclomine HCl 10MG  capsules is required/requested.   Insurance verification completed.   The patient is insured through [facility] .   Per test claim: PA required; PA submitted to above mentioned insurance via CoverMyMeds Key/confirmation #/EOC [id number] Status is pending

## 2024-06-04 ENCOUNTER — Other Ambulatory Visit (HOSPITAL_COMMUNITY): Payer: Self-pay

## 2024-06-04 NOTE — Telephone Encounter (Signed)
 Pharmacy Patient Advocate Encounter  Received notification from [facility] that Prior Authorization for Dicyclomine HCl 10MG  capsules has been APPROVED from 12/25/23 to 12/23/24. Unable to obtain price due to refill too soon rejection, last fill date 06/02/24 next available fill date06/27/25   PA #/Case ID/Reference #: [id number]

## 2024-06-24 DIAGNOSIS — H52223 Regular astigmatism, bilateral: Secondary | ICD-10-CM | POA: Diagnosis not present

## 2024-06-24 DIAGNOSIS — H5203 Hypermetropia, bilateral: Secondary | ICD-10-CM | POA: Diagnosis not present

## 2024-07-16 DIAGNOSIS — M5432 Sciatica, left side: Secondary | ICD-10-CM | POA: Diagnosis not present

## 2024-07-16 DIAGNOSIS — S7402XA Injury of sciatic nerve at hip and thigh level, left leg, initial encounter: Secondary | ICD-10-CM | POA: Diagnosis not present

## 2024-08-25 ENCOUNTER — Ambulatory Visit: Admitting: Family Medicine

## 2024-09-04 ENCOUNTER — Encounter: Payer: Self-pay | Admitting: Family Medicine

## 2024-09-04 ENCOUNTER — Ambulatory Visit (INDEPENDENT_AMBULATORY_CARE_PROVIDER_SITE_OTHER): Admitting: Family Medicine

## 2024-09-04 VITALS — BP 140/80 | HR 78 | Temp 98.0°F | Wt 159.4 lb

## 2024-09-04 DIAGNOSIS — R7309 Other abnormal glucose: Secondary | ICD-10-CM

## 2024-09-04 DIAGNOSIS — I10 Essential (primary) hypertension: Secondary | ICD-10-CM

## 2024-09-04 DIAGNOSIS — E559 Vitamin D deficiency, unspecified: Secondary | ICD-10-CM | POA: Diagnosis not present

## 2024-09-04 DIAGNOSIS — Z1231 Encounter for screening mammogram for malignant neoplasm of breast: Secondary | ICD-10-CM

## 2024-09-04 DIAGNOSIS — Z79899 Other long term (current) drug therapy: Secondary | ICD-10-CM

## 2024-09-04 DIAGNOSIS — K219 Gastro-esophageal reflux disease without esophagitis: Secondary | ICD-10-CM | POA: Diagnosis not present

## 2024-09-04 DIAGNOSIS — Z5181 Encounter for therapeutic drug level monitoring: Secondary | ICD-10-CM

## 2024-09-04 MED ORDER — SPIRONOLACTONE 25 MG PO TABS: 25.0000 mg | ORAL_TABLET | Freq: Every day | ORAL | 1 refills | Status: AC

## 2024-09-04 MED ORDER — TIZANIDINE HCL 2 MG PO CAPS: 2.0000 mg | ORAL_CAPSULE | Freq: Two times a day (BID) | ORAL | 1 refills | Status: AC | PRN

## 2024-09-04 MED ORDER — VERAPAMIL HCL ER 180 MG PO TBCR: 180.0000 mg | EXTENDED_RELEASE_TABLET | Freq: Two times a day (BID) | ORAL | 1 refills | Status: AC

## 2024-09-04 MED ORDER — MELOXICAM 15 MG PO TABS: 15.0000 mg | ORAL_TABLET | Freq: Every day | ORAL | 1 refills | Status: AC

## 2024-09-04 MED ORDER — PANTOPRAZOLE SODIUM 40 MG PO TBEC: 40.0000 mg | DELAYED_RELEASE_TABLET | Freq: Every day | ORAL | 1 refills | Status: AC | PRN

## 2024-09-04 MED ORDER — OLMESARTAN MEDOXOMIL 40 MG PO TABS: 40.0000 mg | ORAL_TABLET | Freq: Every day | ORAL | 1 refills | Status: AC

## 2024-09-04 NOTE — Patient Instructions (Addendum)

## 2024-09-04 NOTE — Progress Notes (Signed)
 Patient Care Team    Relationship Specialty Notifications Start End  Catherine Charlies LABOR, DO PCP - General Family Medicine  08/18/15   Octavia Bruckner, MD Consulting Physician Ophthalmology  05/08/16   Teressa Toribio SQUIBB, MD (Inactive) Attending Physician Gastroenterology  05/08/16   Floy Lynwood PARAS, MD (Inactive) Consulting Physician Otolaryngology  06/25/17   Kemp, Emerge  Specialist  07/01/18   Josephina Aures, MD Consulting Physician Endocrinology  01/28/20     Chief Complaint  Patient presents with   Medication Refill    Pt needs refills on med. Pt declined flu vaccine today.    History of Present Ilness: Heron LABOR Kurk, 78 y.o. , female presents today for Chronic Conditions/illness Management   Essential hypertension, benign/morbid obesity Pt reports compliance with verapamil  240 mg nightly, spironolactone  25 mg daily , Benicar  40 mg daily. Patient denies chest pain, shortness of breath, dizziness or lower extremity edema.   RF: Hypertension, obesity, family history of heart disease   Gastroesophageal reflux disease, esophagitis presence not specified Patient reports compliance with Protonix  20 mg daily.   Medicine is working works well for her   Hypercalcemia/Thyroid  nodule She does have multiple nodules within the thyroid .  Her biopsy was negative for any type of malignancy.  SHe has started to increase her water consumption daily. She is following with endocrinology.      Past Medical History:  Diagnosis Date   Allergy    Anemia    low iron (hgb 8)   Arthritis    knees and hands    Carpal tunnel syndrome    Cataract    Diverticulitis large intestine w/o perforation or abscess w/bleeding    GERD (gastroesophageal reflux disease)    Headache(784.0)    occasional    Hypertension    Lumbar spondylosis    Osteopenia    PONV (postoperative nausea and vomiting)    Vertigo    Vitamin D  deficiency    All allergies reviewed No Known Allergies Past Surgical  History:  Procedure Laterality Date   BREAST REDUCTION SURGERY  1982   CARPAL TUNNEL RELEASE     left    COLONOSCOPY     FOOT SURGERY     KNEE ARTHROSCOPY     right knee 2012    OTHER SURGICAL HISTORY     spur removed top of left foot    REDUCTION MAMMAPLASTY  1982   TOTAL KNEE ARTHROPLASTY  04/21/2012   Procedure: TOTAL KNEE ARTHROPLASTY;  Surgeon: Donnice JONETTA Car, MD;  Location: [facility];  Service: Orthopedics;  Laterality: Right;   VAGINAL HYSTERECTOMY  1977   Family History  Problem Relation Age of Onset   Breast cancer Mother        late in life   Diabetes Mother        late in life   Heart disease Father    Lung disease Father    Colon cancer Neg Hx    Esophageal cancer Neg Hx    Stomach cancer Neg Hx    Rectal cancer Neg Hx    Social History   Social History Narrative   Ms Marsico is widowed (12/2016).    She enjoys music and is involved with her church.    All medications verified Allergies as of 09/04/2024   No Known Allergies      Medication List        Accurate as of September 04, 2024  3:42 PM. If you have any questions, ask  your nurse or doctor.          STOP taking these medications    gabapentin 300 MG capsule Commonly known as: NEURONTIN Stopped by: Charlies Bellini   ondansetron  4 MG disintegrating tablet Commonly known as: ZOFRAN -ODT Stopped by: Janari Gagner   sennosides-docusate sodium  8.6-50 MG tablet Commonly known as: SENOKOT-S Stopped by: Charlies Bellini       TAKE these medications    aspirin  EC 81 MG tablet Take 81 mg by mouth daily.   dicyclomine  10 MG capsule Commonly known as: BENTYL  Take 1 capsule (10 mg total) by mouth 4 (four) times daily -  before meals and at bedtime.   fluticasone  50 MCG/ACT nasal spray Commonly known as: FLONASE  USE 1 SPRAY(S) IN EACH NOSTRIL TWICE DAILY AS NEEDED FOR NASAL CONGESTION   meloxicam  15 MG tablet Commonly known as: MOBIC  Take 1 tablet (15 mg total) by mouth daily.   olmesartan  40  MG tablet Commonly known as: BENICAR  Take 1 tablet (40 mg total) by mouth daily.   OMEGA 3 PO Take 1 capsule by mouth daily.   pantoprazole  40 MG tablet Commonly known as: PROTONIX  Take 1 tablet (40 mg total) by mouth daily as needed.   PROBIOTIC DAILY PO Take by mouth.   Prolia  60 MG/ML Sosy injection Generic drug: denosumab  Inject 60 mg into the skin every 6 (six) months.   spironolactone  25 MG tablet Commonly known as: ALDACTONE  Take 1 tablet (25 mg total) by mouth daily.   tizanidine  2 MG capsule Commonly known as: Zanaflex  Take 1 capsule (2 mg total) by mouth 2 (two) times daily as needed for muscle spasms.   verapamil  180 MG CR tablet Commonly known as: CALAN -SR Take 1 tablet (180 mg total) by mouth 2 (two) times daily. What changed:  medication strength how much to take when to take this Changed by: Charlies Bellini   VITAMIN D  PO Take by mouth.   Xiidra 5 % Soln Generic drug: Lifitegrast Apply 1 drop to eye 2 (two) times daily.           10/02/2023    3:46 PM 04/23/2023    1:41 PM 09/12/2022   10:06 AM 07/31/2022    2:00 PM 08/30/2021   10:19 AM  Depression screen PHQ 2/9  Decreased Interest 0 0 0 0 0  Down, Depressed, Hopeless 0 1 0 0 0  PHQ - 2 Score 0 1 0 0 0       No data to display             02/10/2021    1:07 PM 07/31/2022    2:01 PM 09/12/2022   10:08 AM 04/23/2023    1:41 PM 10/02/2023    3:46 PM  Fall Risk  Falls in the past year? 0 0 0 1 0  Was there an injury with Fall? 0 0 0 0 0  Fall Risk Category Calculator 0 0 0 1 0  Fall Risk Category (Retired) Low  Low  Low     (RETIRED) Patient Fall Risk Level Low fall risk  Low fall risk  Low fall risk     Patient at Risk for Falls Due to  No Fall Risks No Fall Risks;Impaired vision;Impaired balance/gait  No Fall Risks  Patient at Risk for Falls Due to - Comments   at times    Fall risk Follow up  Falls evaluation completed  Falls prevention discussed  Falls evaluation completed Falls  evaluation completed  Data saved with a previous flowsheet row definition    Immunizations: Immunization History  Administered Date(s) Administered   PFIZER(Purple Top)SARS-COV-2 Vaccination 03/17/2020, 04/12/2020, 11/30/2020   PNEUMOCOCCAL CONJUGATE-20 12/26/2022   Pfizer Covid-19 Vaccine Bivalent Booster 29yrs & up 10/25/2021   Pneumococcal Conjugate-13 04/01/2014   Pneumococcal Polysaccharide-23 11/29/2015   Td 08/14/2011   Tdap 03/14/2022   Zoster Recombinant(Shingrix ) 08/15/2022, 11/14/2022   Zoster, Live 01/20/2013   Objective:  BP (!) 140/80   Pulse 78   Temp 98 F (36.7 C) (Temporal)   Wt 159 lb 6.4 oz (72.3 kg)   SpO2 97%   BMI 30.12 kg/m  Physical Exam Vitals and nursing note reviewed.  Constitutional:      General: She is not in acute distress.    Appearance: Normal appearance. She is obese. She is not ill-appearing, toxic-appearing or diaphoretic.  HENT:     Head: Normocephalic and atraumatic.     Mouth/Throat:     Mouth: Mucous membranes are moist.  Eyes:     General: No scleral icterus.       Right eye: No discharge.        Left eye: No discharge.     Extraocular Movements: Extraocular movements intact.     Conjunctiva/sclera: Conjunctivae normal.     Pupils: Pupils are equal, round, and reactive to light.  Cardiovascular:     Rate and Rhythm: Normal rate and regular rhythm.     Heart sounds: No murmur heard. Pulmonary:     Effort: Pulmonary effort is normal. No respiratory distress.     Breath sounds: Normal breath sounds. No wheezing, rhonchi or rales.  Musculoskeletal:     Right lower leg: No edema.     Left lower leg: No edema.  Skin:    General: Skin is warm and dry.     Coloration: Skin is not jaundiced or pale.     Findings: No erythema or rash.  Neurological:     Mental Status: She is alert and oriented to person, place, and time. Mental status is at baseline.     Motor: No weakness.     Gait: Gait normal.  Psychiatric:        Mood  and Affect: Mood normal.        Behavior: Behavior normal.        Thought Content: Thought content normal.        Judgment: Judgment normal.     Assessment and Plan Shelly Leonard is a 78 y.o. female present for chronic condition management Essential hypertension, benign Blood pressure has been difficult to control.  Consider cardiology referral if unable to see improvement. Continue olmesartan  40 mg every day Continue spironolactone  25 mg daily Increase verapamil  to 180 CR BID -Low-sodium diet, exercise encouraged.  Gastroesophageal reflux disease/long-term proton pump inhibitor Stable Continue Protonix . Vitd, b12 and mag collected today   Hypercalcemia/Thyroid  nodule - established with endocrine. Continue routine follow-ups. -Biopsy was benign.   -Continue to work on adequate hydration.  Chronic back pain: Stable Continue Zanaflex  2 mg twice daily as needed. Encouraged her to take before bed.  Patient reports she rarely needs. Continue mobic  qd as needed  Elevated glucose - Hemoglobin A1c Breast cancer screening by mammogram - MM 3D SCREENING MAMMOGRAM BILATERAL BREAST; Future   Return in about 24 weeks (around 02/19/2025).   Meds ordered this encounter  Medications   meloxicam  (MOBIC ) 15 MG tablet    Sig: Take 1 tablet (15 mg total) by mouth daily.  Dispense:  90 tablet    Refill:  1   olmesartan  (BENICAR ) 40 MG tablet    Sig: Take 1 tablet (40 mg total) by mouth daily.    Dispense:  90 tablet    Refill:  1   pantoprazole  (PROTONIX ) 40 MG tablet    Sig: Take 1 tablet (40 mg total) by mouth daily as needed.    Dispense:  90 tablet    Refill:  1   spironolactone  (ALDACTONE ) 25 MG tablet    Sig: Take 1 tablet (25 mg total) by mouth daily.    Dispense:  90 tablet    Refill:  1    Discontinue K-Dur supplement   tizanidine  (ZANAFLEX ) 2 MG capsule    Sig: Take 1 capsule (2 mg total) by mouth 2 (two) times daily as needed for muscle spasms.    Dispense:  180  capsule    Refill:  1   verapamil  (CALAN -SR) 180 MG CR tablet    Sig: Take 1 tablet (180 mg total) by mouth 2 (two) times daily.    Dispense:  180 tablet    Refill:  1   Orders Placed This Encounter  Procedures   MM 3D SCREENING MAMMOGRAM BILATERAL BREAST   CBC   Basic Metabolic Panel (BMET)   TSH   Hemoglobin A1c   Lipid panel   Vitamin D  (25 hydroxy)   Magnesium    Electronically Signed by: Charlies Bellini, DO [facility]- [place]

## 2024-09-05 LAB — LIPID PANEL
Cholesterol: 170 mg/dL (ref <200)
HDL: 59 mg/dL (ref >=50)
LDL Cholesterol (Calc): 91 mg/dL
Non-HDL Cholesterol (Calc): 111 mg/dL (ref <130)
Total CHOL/HDL Ratio: 2.9 (calc) (ref <5.0)
Triglycerides: 107 mg/dL (ref <150)

## 2024-09-05 LAB — TSH: TSH: 0.51 m[IU]/L (ref 0.40–4.50)

## 2024-09-05 LAB — HEMOGLOBIN A1C
Hgb A1c MFr Bld: 6.2 % — ABNORMAL HIGH (ref <5.7)
Mean Plasma Glucose: 131 mg/dL
eAG (mmol/L): 7.3 mmol/L

## 2024-09-05 LAB — CBC
HCT: 39.8 % (ref 35.0–45.0)
Hemoglobin: 13 g/dL (ref 11.7–15.5)
MCH: 29.6 pg (ref 27.0–33.0)
MCHC: 32.7 g/dL (ref 32.0–36.0)
MCV: 90.7 fL (ref 80.0–100.0)
MPV: 10.1 fL (ref 7.5–12.5)
Platelets: 283 Thousand/uL (ref 140–400)
RBC: 4.39 Million/uL (ref 3.80–5.10)
RDW: 13.5 % (ref 11.0–15.0)
WBC: 6.4 Thousand/uL (ref 3.8–10.8)

## 2024-09-05 LAB — BASIC METABOLIC PANEL WITH GFR
BUN: 11 mg/dL (ref 7–25)
CO2: 22 mmol/L (ref 20–32)
Calcium: 10.5 mg/dL — ABNORMAL HIGH (ref 8.6–10.4)
Chloride: 105 mmol/L (ref 98–110)
Creat: 0.6 mg/dL (ref 0.60–1.00)
Glucose, Bld: 86 mg/dL (ref 65–99)
Potassium: 4.1 mmol/L (ref 3.5–5.3)
Sodium: 137 mmol/L (ref 135–146)
eGFR: 93 mL/min/1.73m2 (ref >=60)

## 2024-09-05 LAB — VITAMIN D 25 HYDROXY (VIT D DEFICIENCY, FRACTURES): Vit D, 25-Hydroxy: 31 ng/mL (ref 30–100)

## 2024-09-05 LAB — MAGNESIUM: Magnesium: 2.3 mg/dL (ref 1.5–2.5)

## 2024-09-07 ENCOUNTER — Ambulatory Visit: Payer: Self-pay | Admitting: Family Medicine

## 2024-09-07 ENCOUNTER — Telehealth: Payer: Self-pay

## 2024-09-07 MED ORDER — DICYCLOMINE HCL 10 MG PO CAPS: 10.0000 mg | ORAL_CAPSULE | Freq: Three times a day (TID) | ORAL | 1 refills | Status: AC

## 2024-09-07 NOTE — Telephone Encounter (Signed)
 Refilled bentyl  for her.

## 2024-09-07 NOTE — Telephone Encounter (Signed)
 Pt aware and verbalized understanding.

## 2024-09-07 NOTE — Telephone Encounter (Signed)
 Patient came by office today. Patient was seen on Friday 9/12 by Dr. Catherine. When provider asked patient if she wanted stomach medication she wasn't thinking and said no. She is requesting prescription called in to calm her stomach. She forgot name of med.  [facility]

## 2024-09-08 NOTE — Telephone Encounter (Signed)
 Communication  Reason for CRM: pt called back because she stated she missed a call from a nurse to relay lab results. I asked her if she had follow-up questions because the nurse put a note on lab results that she spoke with patient and patient verbalized understanding. However, pt is claiming she never spoke with a nurse at all. Pt preferred to speak with nurse directly and asked for a callback.     Unable to reach pt, LVM.

## 2024-09-08 NOTE — Telephone Encounter (Signed)
 Spoke with pt. Pt concerns addressed.

## 2024-09-29 ENCOUNTER — Other Ambulatory Visit (HOSPITAL_COMMUNITY): Payer: Self-pay

## 2024-09-29 ENCOUNTER — Telehealth: Payer: Self-pay

## 2024-09-29 NOTE — Telephone Encounter (Addendum)
 MEDICAL PA SUBMITTED VIA [employer]. Authorization Number : [id number]      PHARMACY BENEFIT: REFILL TOO SOON. NEXT FILL 10/24/24

## 2024-09-29 NOTE — Telephone Encounter (Signed)
 SABRA

## 2024-09-29 NOTE — Telephone Encounter (Signed)
 Prolia BIV in separate encounter.

## 2024-09-29 NOTE — Telephone Encounter (Signed)
 Pt ready for scheduling for PROLIA  on or after : 10/30/24  Option# 1: Buy/Bill (Office supplied medication)  Out-of-pocket cost due at time of clinic visit: $352  Number of injection/visits approved: 2  Primary: [employer] Prolia  co-insurance: 20% Admin fee co-insurance: $20  Secondary: --- Prolia  co-insurance:  Admin fee co-insurance:   Medical Benefit Details: Date Benefits were checked: 09/29/24 Deductible: NO/ Coinsurance: 20%/ Admin Fee: $20  Prior Auth: APPROVED PA# [id number]  Expiration Date: 10/17/24-10/17/25  # of doses approved: 2 ----------------------------------------------------------------------- Option# 2- Med Obtained from pharmacy:  Pharmacy benefit: Copay $--- REFILL TOO SOON (Paid to pharmacy) Admin Fee: $20 (Pay at clinic)  Prior Auth: N/A PA# Expiration Date:   # of doses approved:   If patient wants fill through the pharmacy benefit please send prescription to: [facility], and include estimated need by date in rx notes. Pharmacy will ship medication directly to the office.  Patient NOT eligible for Prolia  Copay Card. Copay Card can make patient's cost as little as $25. Link to apply: [email or web address]://www.amgensupportplus.com/copay  ** This summary of benefits is an estimation of the patient's out-of-pocket cost. Exact cost may very based on individual plan coverage.

## 2024-09-29 NOTE — Telephone Encounter (Signed)
 Please complete PA for prolia . Current approval ends 10/16/24. Pt due for injection in November

## 2024-09-30 NOTE — Telephone Encounter (Signed)
 noted

## 2024-10-02 NOTE — Progress Notes (Signed)
 Shelly Leonard                                          MRN: [id number]   10/02/2024   The [facility]BCI Quality Team Specialist reviewed this patient medical record for the purposes of chart review for care gap closure. The following were reviewed: chart review for care gap closure-controlling blood pressure.    VBCI Quality Team

## 2024-10-19 ENCOUNTER — Ambulatory Visit
Admission: RE | Admit: 2024-10-19 | Discharge: 2024-10-19 | Disposition: A | Discharge status: Home / Self Care | Source: Ambulatory Visit | Attending: Family Medicine | Admitting: Family Medicine

## 2024-10-19 ENCOUNTER — Encounter

## 2024-10-19 DIAGNOSIS — Z1231 Encounter for screening mammogram for malignant neoplasm of breast: Secondary | ICD-10-CM

## 2024-10-22 ENCOUNTER — Telehealth: Payer: Self-pay | Admitting: Family Medicine

## 2024-10-22 NOTE — Telephone Encounter (Signed)
Please inform patient her mammogram is normal.  

## 2024-10-22 NOTE — Telephone Encounter (Signed)
 LVM to discuss. Pt MyChart not activated.

## 2024-10-23 NOTE — Telephone Encounter (Signed)
 Patient returning call regarding mammogram results.  Advised patient of normal mammogram.  Patient has no further questions or concerns at this time.

## 2024-10-23 NOTE — Telephone Encounter (Signed)
 LVM to discuss, still unable to reach pt.

## 2024-10-29 NOTE — Telephone Encounter (Signed)
 Pt called in to state that her insurance has approved her Prolia  shot as of 10/24/2024 and would like it to be sent to [facility] [place] to be picked up.

## 2024-10-30 NOTE — Telephone Encounter (Signed)
 Called pharmacy to have prolia  transferred from [facility] per pt request. Pharmacy will have to order and will contact pt to let her know when ready. LVM for pt to call once received to rescheduled nurse visit.

## 2024-11-02 ENCOUNTER — Other Ambulatory Visit

## 2024-11-09 ENCOUNTER — Other Ambulatory Visit: Payer: Self-pay | Admitting: Family

## 2024-11-09 NOTE — Telephone Encounter (Signed)
 [facility] to [facility] to [facility]. Pt should have had one left at [facility] pharmacy

## 2024-11-09 NOTE — Telephone Encounter (Signed)
 LM for pt to return call to discuss.

## 2024-11-09 NOTE — Telephone Encounter (Signed)
 Patient called in stating that she does not know what's going on with her prolia  injection. She stated that the [facility] was [facility] to [facility] [place]. Patient stated that the pharmacy still has not requested for the transfer from [facility].

## 2024-11-09 NOTE — Telephone Encounter (Signed)
 Spoke with patient regarding results/recommendations.

## 2024-11-13 ENCOUNTER — Telehealth: Payer: Self-pay

## 2024-11-13 NOTE — Telephone Encounter (Signed)
 Copied from CRM #[id number]. Topic: Clinical - Prescription Issue >> Nov 12, 2024  1:09 PM Ashley R wrote: Reason for CRM:  PROLIA  60 MG/ML SOSY injection [facility] to [facility] in [facility] with no success to confirm order before sending. Office can do this if Proxy is allowed to confirm confirm copay, confirm delivery address otherwise will need to be order, submitted with patient. Callback [phone]

## 2024-11-13 NOTE — Telephone Encounter (Signed)
 Spoke with pharmacy and advised pt that she will have to call to confirm that she wants rx shipped

## 2024-11-18 ENCOUNTER — Other Ambulatory Visit: Payer: Self-pay | Admitting: Family Medicine

## 2024-11-25 ENCOUNTER — Ambulatory Visit: Payer: Self-pay

## 2024-11-25 MED ORDER — ONDANSETRON 4 MG PO TBDP: 4.0000 mg | ORAL_TABLET | Freq: Three times a day (TID) | ORAL | 11 refills | Status: DC | PRN

## 2024-11-25 NOTE — Telephone Encounter (Signed)
 FYI Only or Action Required?: Action required by provider: clinical question for provider.  Patient was last seen in primary care on 09/04/2024 by Catherine Fuller A, DO.  Called Nurse Triage reporting Medication Problem.    Triage Disposition: Call PCP Now  Patient/caregiver understands and will follow disposition?: No, wishes to speak with PCP       Copied from CRM #[id number]. Topic: Clinical - Prescription Issue >> Nov 25, 2024 10:41 AM Franky GRADE wrote: Reason for CRM: Patient is calling to see why her request for ondansetron  (ZOFRAN -ODT) 4 MG disintegrating tablet [Pharmacy Med Name: ONDANSETRON  ODT 4 MG TABLET] [[id number]] was refused. She needs this medication for her nausea spells. Reason for Disposition  [1] Caller requests to speak ONLY to PCP AND [2] URGENT question  Answer Assessment - Initial Assessment Questions 1. REASON FOR CALL or QUESTION: What is your reason for calling today? or How can I best   Patient is calling to see why her request for ondansetron  (ZOFRAN -ODT) 4 MG disintegrating tablet [Pharmacy Med Name: ONDANSETRON  ODT 4 MG TABLET] [[id number]] was refused. She needs this medication for her nausea spells.  Protocols used: PCP Call - No Triage-A-AH

## 2024-11-25 NOTE — Telephone Encounter (Signed)
 Pt requesting refill on Zofran . Medication not listed on current med list, looks like it was previously discontinued. Please advise.

## 2024-11-25 NOTE — Telephone Encounter (Signed)
Refilled her Zofran.

## 2024-11-26 NOTE — Telephone Encounter (Signed)
 Pt aware and verbalized understanding.

## 2024-11-27 ENCOUNTER — Ambulatory Visit

## 2024-11-27 DIAGNOSIS — M810 Age-related osteoporosis without current pathological fracture: Secondary | ICD-10-CM

## 2024-11-27 MED ORDER — DENOSUMAB 60 MG/ML ~~LOC~~ SOSY
60.0000 mg | PREFILLED_SYRINGE | Freq: Once | SUBCUTANEOUS | Status: AC
  Administered 2024-11-27: 60 mg via SUBCUTANEOUS

## 2024-11-27 NOTE — Progress Notes (Signed)
 Pt came for prolia injection. Pt provided medication at appt. Pt tolerated administer medication.

## 2024-12-02 ENCOUNTER — Other Ambulatory Visit: Payer: Self-pay

## 2024-12-02 MED ORDER — ONDANSETRON 4 MG PO TBDP: 4.0000 mg | ORAL_TABLET | Freq: Three times a day (TID) | ORAL | 11 refills | Status: AC | PRN

## 2024-12-11 ENCOUNTER — Ambulatory Visit: Admitting: Family Medicine

## 2024-12-11 ENCOUNTER — Encounter: Payer: Self-pay | Admitting: Family Medicine

## 2024-12-11 VITALS — BP 128/76 | HR 73 | Temp 97.9°F | Ht 61.0 in | Wt 157.4 lb

## 2024-12-11 DIAGNOSIS — R1032 Left lower quadrant pain: Secondary | ICD-10-CM | POA: Diagnosis not present

## 2024-12-11 DIAGNOSIS — R5383 Other fatigue: Secondary | ICD-10-CM | POA: Diagnosis not present

## 2024-12-11 DIAGNOSIS — K639 Disease of intestine, unspecified: Secondary | ICD-10-CM

## 2024-12-11 MED ORDER — AMOXICILLIN-POT CLAVULANATE 875-125 MG PO TABS: 1.0000 | ORAL_TABLET | Freq: Two times a day (BID) | ORAL | 0 refills | Status: AC

## 2024-12-11 NOTE — Patient Instructions (Addendum)
 Return in about 10 weeks (around 02/19/2025) for Routine chronic condition follow-up.         Great to see you today.  I have refilled the medication(s) we provide.   If labs were collected or images ordered, we will inform you of  results once we have received them and reviewed. We will contact you either by echart message, or telephone call.  Please give ample time to the testing facility, and our office to run,  receive and review results. Please do not call inquiring of results, even if you can see them in your chart. We will contact you as soon as we are able. If it has been over 1 week since the test was completed, and you have not yet heard from us , then please call us .    - echart message- for normal results that have been seen by the patient already.   - telephone call: abnormal results or if patient has not viewed results in their echart.  If a referral to a specialist was entered for you, please call us  in 2 weeks if you have not heard from the specialist office to schedule.

## 2024-12-11 NOTE — Progress Notes (Unsigned)
 "      Shelly Leonard , [birth year], 78 y.o., female MRN: [id number] Patient Care Team    Relationship Specialty Notifications Start End  Catherine Charlies DELENA, DO PCP - General Family Medicine  08/18/15   Octavia Bruckner, MD Consulting Physician Ophthalmology  05/08/16   Teressa Toribio SQUIBB, MD (Inactive) Attending Physician Gastroenterology  05/08/16   Floy Lynwood PARAS, MD (Inactive) Consulting Physician Otolaryngology  06/25/17   Kemp Emerge  Specialist  07/01/18   Josephina Aures, MD Consulting Physician Endocrinology  01/28/20     Chief Complaint  Patient presents with   Acute Visit    Stomach issues      Subjective: Shelly Leonard is a 78 y.o. Pt presents for an OV with complaints of boating  of 5 days duration.  Associated symptoms include ***.  Pt has tried *** to ease their symptoms.      12/11/2024    2:57 PM 10/02/2023    3:46 PM 04/23/2023    1:41 PM 09/12/2022   10:06 AM 07/31/2022    2:00 PM  Depression screen PHQ 2/9  Decreased Interest 1 0 0 0 0  Down, Depressed, Hopeless 0 0 1 0 0  PHQ - 2 Score 1 0 1 0 0  Altered sleeping 3      Tired, decreased energy 3      Change in appetite 1      Feeling bad or failure about yourself  0      Trouble concentrating 0      Moving slowly or fidgety/restless 0      Suicidal thoughts 0      PHQ-9 Score 8      Difficult doing work/chores Not difficult at all        Allergies[1] Social History   Social History Narrative   Ms Berndt is widowed (12/2016).    She enjoys music and is involved with her church.   Past Medical History:  Diagnosis Date   Allergy    Anemia    low iron (hgb 8)   Arthritis    knees and hands    Carpal tunnel syndrome    Cataract    Diverticulitis large intestine w/o perforation or abscess w/bleeding    GERD (gastroesophageal reflux disease)    Headache(784.0)    occasional    Hypertension    Lumbar spondylosis    Osteopenia    PONV (postoperative nausea and vomiting)    Vertigo     Vitamin D  deficiency    Past Surgical History:  Procedure Laterality Date   BREAST REDUCTION SURGERY  1982   CARPAL TUNNEL RELEASE     left    COLONOSCOPY     FOOT SURGERY     KNEE ARTHROSCOPY     right knee 2012    OTHER SURGICAL HISTORY     spur removed top of left foot    REDUCTION MAMMAPLASTY  1982   TOTAL KNEE ARTHROPLASTY  04/21/2012   Procedure: TOTAL KNEE ARTHROPLASTY;  Surgeon: Donnice JONETTA Car, MD;  Location: [facility];  Service: Orthopedics;  Laterality: Right;   VAGINAL HYSTERECTOMY  1977   Family History  Problem Relation Age of Onset   Breast cancer Mother        late in life   Diabetes Mother        late in life   Heart disease Father    Lung disease Father    Colon cancer Neg Hx    Esophageal  cancer Neg Hx    Stomach cancer Neg Hx    Rectal cancer Neg Hx    Allergies as of 12/11/2024   No Known Allergies      Medication List        Accurate as of December 11, 2024  3:11 PM. If you have any questions, ask your nurse or doctor.          amoxicillin -clavulanate 875-125 MG tablet Commonly known as: AUGMENTIN  Take 1 tablet by mouth 2 (two) times daily. Started by: Charlies Bellini, DO   aspirin  EC 81 MG tablet Take 81 mg by mouth daily.   dicyclomine  10 MG capsule Commonly known as: BENTYL  Take 1 capsule (10 mg total) by mouth 4 (four) times daily -  before meals and at bedtime.   fluticasone  50 MCG/ACT nasal spray Commonly known as: FLONASE  USE 1 SPRAY(S) IN EACH NOSTRIL TWICE DAILY AS NEEDED FOR NASAL CONGESTION   meloxicam  15 MG tablet Commonly known as: MOBIC  Take 1 tablet (15 mg total) by mouth daily.   ofloxacin 0.3 % ophthalmic solution Commonly known as: OCUFLOX INSTILL 1 DROP 4 TIMES A DAY INTO RIGHT EYE FOR 1 WEEK   olmesartan  40 MG tablet Commonly known as: BENICAR  Take 1 tablet (40 mg total) by mouth daily.   OMEGA 3 PO Take 1 capsule by mouth daily.   ondansetron  4 MG disintegrating tablet Commonly known as: ZOFRAN -ODT Take  1 tablet (4 mg total) by mouth every 8 (eight) hours as needed for nausea or vomiting.   pantoprazole  40 MG tablet Commonly known as: PROTONIX  Take 1 tablet (40 mg total) by mouth daily as needed.   prednisoLONE acetate 1 % ophthalmic suspension Commonly known as: PRED FORTE PLEASE SEE ATTACHED FOR DETAILED DIRECTIONS   PROBIOTIC DAILY PO Take by mouth.   Prolia  60 MG/ML Sosy injection Generic drug: denosumab  Inject 60 mg into the skin every 6 (six) months.   spironolactone  25 MG tablet Commonly known as: ALDACTONE  Take 1 tablet (25 mg total) by mouth daily.   tizanidine  2 MG capsule Commonly known as: Zanaflex  Take 1 capsule (2 mg total) by mouth 2 (two) times daily as needed for muscle spasms.   verapamil  180 MG CR tablet Commonly known as: CALAN -SR Take 1 tablet (180 mg total) by mouth 2 (two) times daily.   VITAMIN D  PO Take by mouth.   Vitamin D3 100000 UNIT/GM Powd Take by mouth.   Xiidra 5 % Soln Generic drug: Lifitegrast Apply 1 drop to eye 2 (two) times daily.        All past medical history, surgical history, allergies, family history, immunizations andmedications were updated in the EMR today and reviewed under the history and medication portions of their EMR.     Review of Systems  Constitutional:  Negative for chills and fever.  Gastrointestinal:  Positive for abdominal pain and constipation. Negative for diarrhea, nausea and vomiting.  Genitourinary: Negative.   Musculoskeletal: Negative.   Skin:  Negative for rash.   Negative, with the exception of above mentioned in HPI   Objective:  BP 128/76   Pulse 73   Temp 97.9 F (36.6 C)   Ht 5' 1 (1.549 m)   Wt 157 lb 6.4 oz (71.4 kg)   SpO2 97%   BMI 29.74 kg/m  Body mass index is 29.74 kg/m. Physical Exam ***  No results found. No results found. No results found for this or any previous visit (from the past 24 hours).  Assessment/Plan:  Shelly Leonard is a 78 y.o. female present  for OV for  *** Reviewed expectations re: course of current medical issues. Discussed self-management of symptoms. Outlined signs and symptoms indicating need for more acute intervention. Patient verbalized understanding and all questions were answered. Patient received an After-Visit Summary.    Orders Placed This Encounter  Procedures   CBC w/Diff   C-reactive protein   Basic Metabolic Panel (BMET)   Iron, TIBC and Ferritin Panel   TSH   Meds ordered this encounter  Medications   amoxicillin -clavulanate (AUGMENTIN ) 875-125 MG tablet    Sig: Take 1 tablet by mouth 2 (two) times daily.    Dispense:  14 tablet    Refill:  0   Referral Orders  No referral(s) requested today     Note is dictated utilizing voice recognition software. Although note has been proof read prior to signing, occasional typographical errors still can be missed. If any questions arise, please do not hesitate to call for verification.   electronically signed by:  Charlies Bellini, DO  [facility] [place]        [1] No Known Allergies  "

## 2024-12-12 LAB — CBC WITH DIFFERENTIAL/PLATELET
Absolute Lymphocytes: 2318 {cells}/uL (ref 850–3900)
Absolute Monocytes: 458 {cells}/uL (ref 200–950)
Basophils Absolute: 43 {cells}/uL (ref 0–200)
Basophils Relative: 0.7 %
Eosinophils Absolute: 281 {cells}/uL (ref 15–500)
Eosinophils Relative: 4.6 %
HCT: 38 % (ref 35.9–46.0)
Hemoglobin: 12.3 g/dL (ref 11.7–15.5)
MCH: 29 pg (ref 27.0–33.0)
MCHC: 32.4 g/dL (ref 31.6–35.4)
MCV: 89.6 fL (ref 81.4–101.7)
MPV: 10 fL (ref 7.5–12.5)
Monocytes Relative: 7.5 %
Neutro Abs: 3001 {cells}/uL (ref 1500–7800)
Neutrophils Relative %: 49.2 %
Platelets: 274 Thousand/uL (ref 140–400)
RBC: 4.24 Million/uL (ref 3.80–5.10)
RDW: 13.3 % (ref 11.0–15.0)
Total Lymphocyte: 38 %
WBC: 6.1 Thousand/uL (ref 3.8–10.8)

## 2024-12-12 LAB — IRON,TIBC AND FERRITIN PANEL
%SAT: 21 % (ref 16–45)
Ferritin: 106 ng/mL (ref 16–288)
Iron: 60 ug/dL (ref 45–160)
TIBC: 291 ug/dL (ref 250–450)

## 2024-12-12 LAB — C-REACTIVE PROTEIN: CRP: <3 mg/L

## 2024-12-12 LAB — TSH: TSH: 0.41 m[IU]/L (ref 0.40–4.50)

## 2024-12-14 ENCOUNTER — Ambulatory Visit: Payer: Self-pay | Admitting: Family Medicine

## 2024-12-16 NOTE — Telephone Encounter (Signed)
 Please call patient I am still waiting on her urine culture result and may not get it back before the holiday. The urine itself had many crystals, which may be the discomfort she was experiencing.  Crystal formations eventually can cause kidney stones. While we wait on her culture result, I recommend she increase her hydration efforts to flush her kidneys. If her symptoms have worsened or she has not started the antibiotic, I would recommend she go ahead and start the antibiotic if she has any symptoms while we wait on the urine culture.

## 2024-12-17 LAB — URINE CULTURE
MICRO NUMBER:: 17391455
SPECIMEN QUALITY:: ADEQUATE

## 2024-12-17 LAB — URINALYSIS W MICROSCOPIC + REFLEX CULTURE
Bilirubin Urine: NEGATIVE
Glucose, UA: NEGATIVE
Hgb urine dipstick: NEGATIVE
Hyaline Cast: NONE SEEN /LPF
Ketones, ur: NEGATIVE
Leukocyte Esterase: NEGATIVE
Nitrites, Initial: POSITIVE — AB
Protein, ur: NEGATIVE
RBC / HPF: NONE SEEN /HPF (ref 0–2)
Specific Gravity, Urine: 1.018 (ref 1.001–1.035)
pH: 6 (ref 5.0–8.0)

## 2024-12-17 LAB — CULTURE INDICATED

## 2024-12-17 LAB — EXTRA URINE SPECIMEN

## 2024-12-21 NOTE — Progress Notes (Signed)
 LVM to discuss. Pt MyChart not active.

## 2024-12-21 NOTE — Telephone Encounter (Signed)
 Please inform patient by urine culture she did have a UTI.  The amoxicillin /Augmentin  that was prescribed for her will treat appropriately.  Hopefully she has finished the antibiotic and is feeling better.

## 2025-01-12 NOTE — Progress Notes (Signed)
 Shelly Leonard                                          MRN: [id number]   01/12/2025   The VBCI Quality Team Specialist reviewed this patient medical record for the purposes of chart review for care gap closure. The following were reviewed: abstraction for care gap closure-controlling blood pressure.    VBCI Quality Team

## 2025-02-24 ENCOUNTER — Ambulatory Visit
